# Patient Record
Sex: Female | Born: 1950 | Race: White | Hispanic: No | Marital: Married | Smoking: Never smoker
Health system: Southern US, Community
[De-identification: ages and names within clinical notes are randomized; demographics above are authoritative.]

## PROBLEM LIST (undated history)

## (undated) ENCOUNTER — Emergency Department (HOSPITAL_COMMUNITY): Payer: Managed Care, Other (non HMO) | Source: Home / Self Care

## (undated) DIAGNOSIS — M1990 Unspecified osteoarthritis, unspecified site: Secondary | ICD-10-CM

## (undated) DIAGNOSIS — I10 Essential (primary) hypertension: Secondary | ICD-10-CM

## (undated) DIAGNOSIS — T7840XA Allergy, unspecified, initial encounter: Secondary | ICD-10-CM

## (undated) DIAGNOSIS — M419 Scoliosis, unspecified: Secondary | ICD-10-CM

## (undated) HISTORY — PX: OTHER SURGICAL HISTORY: SHX169

## (undated) HISTORY — DX: Allergy, unspecified, initial encounter: T78.40XA

## (undated) HISTORY — PX: TONSILLECTOMY: SUR1361

---

## 2001-07-10 ENCOUNTER — Other Ambulatory Visit: Admission: RE | Admit: 2001-07-10 | Discharge: 2001-07-10 | Payer: Self-pay | Admitting: Family Medicine

## 2004-09-08 ENCOUNTER — Ambulatory Visit: Payer: Self-pay | Admitting: Family Medicine

## 2005-12-14 ENCOUNTER — Ambulatory Visit: Payer: Self-pay | Admitting: Family Medicine

## 2007-01-05 ENCOUNTER — Ambulatory Visit: Payer: Self-pay

## 2007-01-20 ENCOUNTER — Ambulatory Visit: Payer: Self-pay | Admitting: General Surgery

## 2007-01-20 LAB — HM COLONOSCOPY

## 2008-04-02 ENCOUNTER — Ambulatory Visit: Payer: Self-pay | Admitting: Family Medicine

## 2008-04-16 ENCOUNTER — Ambulatory Visit: Payer: Self-pay | Admitting: Family Medicine

## 2009-05-01 ENCOUNTER — Ambulatory Visit: Payer: Self-pay | Admitting: Family Medicine

## 2010-06-30 ENCOUNTER — Ambulatory Visit: Payer: Self-pay

## 2011-04-16 ENCOUNTER — Emergency Department: Payer: Self-pay | Admitting: Emergency Medicine

## 2011-07-21 ENCOUNTER — Ambulatory Visit: Payer: Self-pay | Admitting: Family Medicine

## 2011-08-24 HISTORY — PX: OTHER SURGICAL HISTORY: SHX169

## 2012-08-10 ENCOUNTER — Ambulatory Visit: Payer: Self-pay | Admitting: Family Medicine

## 2012-09-21 ENCOUNTER — Ambulatory Visit: Payer: Self-pay | Admitting: Family Medicine

## 2013-07-06 LAB — HM PAP SMEAR: HM PAP: NEGATIVE

## 2013-08-13 ENCOUNTER — Ambulatory Visit: Payer: Self-pay | Admitting: Family Medicine

## 2013-08-13 LAB — HM MAMMOGRAPHY

## 2014-08-30 LAB — LIPID PANEL
Cholesterol: 180 mg/dL (ref 0–200)
HDL: 76 mg/dL — AB (ref 35–70)
LDL CALC: 87 mg/dL
TRIGLYCERIDES: 86 mg/dL (ref 40–160)

## 2014-08-30 LAB — BASIC METABOLIC PANEL
BUN: 16 mg/dL (ref 4–21)
Creatinine: 0.9 mg/dL (ref 0.5–1.1)
Glucose: 104 mg/dL
POTASSIUM: 3.9 mmol/L (ref 3.4–5.3)
SODIUM: 144 mmol/L (ref 137–147)

## 2014-08-30 LAB — TSH: TSH: 2.43 u[IU]/mL (ref 0.41–5.90)

## 2014-08-30 LAB — CBC AND DIFFERENTIAL
HEMATOCRIT: 45 % (ref 36–46)
HEMOGLOBIN: 15 g/dL (ref 12.0–16.0)
Platelets: 219 10*3/uL (ref 150–399)
WBC: 6.7 10^3/mL

## 2014-08-30 LAB — HEPATIC FUNCTION PANEL
ALT: 20 U/L (ref 7–35)
AST: 21 U/L (ref 13–35)

## 2015-01-02 DIAGNOSIS — S83209A Unspecified tear of unspecified meniscus, current injury, unspecified knee, initial encounter: Secondary | ICD-10-CM | POA: Insufficient documentation

## 2015-03-12 ENCOUNTER — Telehealth: Payer: Self-pay | Admitting: Family Medicine

## 2015-03-12 ENCOUNTER — Other Ambulatory Visit: Payer: Self-pay | Admitting: Family Medicine

## 2015-03-12 DIAGNOSIS — E669 Obesity, unspecified: Secondary | ICD-10-CM | POA: Insufficient documentation

## 2015-03-12 NOTE — Telephone Encounter (Signed)
Last OV 08/2014   

## 2015-04-28 ENCOUNTER — Other Ambulatory Visit: Payer: Self-pay | Admitting: Family Medicine

## 2015-04-28 DIAGNOSIS — Z1231 Encounter for screening mammogram for malignant neoplasm of breast: Secondary | ICD-10-CM

## 2015-04-30 ENCOUNTER — Ambulatory Visit
Admission: RE | Admit: 2015-04-30 | Discharge: 2015-04-30 | Disposition: A | Discharge status: Home / Self Care | Payer: Managed Care, Other (non HMO) | Source: Ambulatory Visit | Attending: Family Medicine | Admitting: Family Medicine

## 2015-04-30 DIAGNOSIS — Z1231 Encounter for screening mammogram for malignant neoplasm of breast: Secondary | ICD-10-CM | POA: Diagnosis not present

## 2015-09-08 DIAGNOSIS — M25569 Pain in unspecified knee: Secondary | ICD-10-CM | POA: Insufficient documentation

## 2015-09-08 DIAGNOSIS — R799 Abnormal finding of blood chemistry, unspecified: Secondary | ICD-10-CM | POA: Insufficient documentation

## 2015-09-08 DIAGNOSIS — I10 Essential (primary) hypertension: Secondary | ICD-10-CM | POA: Insufficient documentation

## 2015-09-08 DIAGNOSIS — E669 Obesity, unspecified: Secondary | ICD-10-CM | POA: Insufficient documentation

## 2015-09-08 DIAGNOSIS — J45909 Unspecified asthma, uncomplicated: Secondary | ICD-10-CM | POA: Insufficient documentation

## 2015-09-08 DIAGNOSIS — J309 Allergic rhinitis, unspecified: Secondary | ICD-10-CM | POA: Insufficient documentation

## 2015-09-08 DIAGNOSIS — Z8619 Personal history of other infectious and parasitic diseases: Secondary | ICD-10-CM | POA: Insufficient documentation

## 2015-09-08 DIAGNOSIS — R195 Other fecal abnormalities: Secondary | ICD-10-CM | POA: Insufficient documentation

## 2015-09-15 ENCOUNTER — Ambulatory Visit (INDEPENDENT_AMBULATORY_CARE_PROVIDER_SITE_OTHER): Payer: Managed Care, Other (non HMO) | Admitting: Family Medicine

## 2015-09-15 ENCOUNTER — Encounter: Payer: Self-pay | Admitting: Family Medicine

## 2015-09-15 VITALS — BP 136/86 | HR 82 | Temp 99.0°F | Resp 16 | Ht 66.5 in | Wt 256.0 lb

## 2015-09-15 DIAGNOSIS — E669 Obesity, unspecified: Secondary | ICD-10-CM

## 2015-09-15 DIAGNOSIS — Z0000 Encounter for general adult medical examination without abnormal findings: Secondary | ICD-10-CM

## 2015-09-15 LAB — POCT URINALYSIS DIPSTICK
BILIRUBIN UA: NEGATIVE
Blood, UA: NEGATIVE
GLUCOSE UA: NEGATIVE
KETONES UA: NEGATIVE
LEUKOCYTES UA: NEGATIVE
Nitrite, UA: NEGATIVE
PROTEIN UA: NEGATIVE
Spec Grav, UA: 1.01
Urobilinogen, UA: 0.2
pH, UA: 6

## 2015-09-15 MED ORDER — LORCASERIN HCL 10 MG PO TABS: 1.0000 | ORAL_TABLET | Freq: Two times a day (BID) | ORAL | Status: DC

## 2015-09-15 NOTE — Progress Notes (Signed)
Patient ID: ZEIDY TAYAG, female   DOB: 1950/10/22, 65 y.o.   MRN: [id number]       Patient: Rita Bailey, Female    DOB: 04-Jan-1951, 65 y.o.   MRN: [id number] Visit Date: 09/15/2015  Today's Provider: Margarita Rana, MD   Chief Complaint  Patient presents with  . Annual Exam   Subjective:    Annual physical exam Rita Bailey is a 65 y.o. female who presents today for health maintenance and complete physical. She feels well. She reports exercising daily 4000 steps daily. She reports she is sleeping well. 08/29/14 CPE 07/06/13 Pap-neg; HPV-neg 04/30/15 Mammo-BI-RADS 1 01/20/07 Colon-polyp, recheck in 10 yrs  Lab Results  Component Value Date   WBC 6.7 08/30/2014   HGB 15.0 08/30/2014   HCT 45 08/30/2014   PLT 219 08/30/2014   CHOL 180 08/30/2014   TRIG 86 08/30/2014   HDL 76* 08/30/2014   LDLCALC 87 08/30/2014   ALT 20 08/30/2014   AST 21 08/30/2014   NA 144 08/30/2014   K 3.9 08/30/2014   CREATININE 0.9 08/30/2014   BUN 16 08/30/2014   TSH 2.43 08/30/2014    -----------------------------------------------------------------   Review of Systems  Constitutional: Negative.   HENT: Negative.   Eyes: Negative.   Respiratory: Negative.   Cardiovascular: Negative.   Gastrointestinal: Negative.   Endocrine: Negative.   Genitourinary: Negative.   Musculoskeletal: Positive for joint swelling (left knee).  Skin: Negative.   Allergic/Immunologic: Negative.   Neurological: Negative.   Hematological: Negative.   Psychiatric/Behavioral: Negative.     Social History      She  reports that she has never smoked. She has never used smokeless tobacco. She reports that she does not drink alcohol or use illicit drugs.       Social History   Social History  . Marital Status: Married    Spouse Name: N/A  . Number of Children: N/A  . Years of Education: N/A   Social History Main Topics  . Smoking status: Never Smoker   . Smokeless tobacco: Never Used  .  Alcohol Use: No  . Drug Use: No  . Sexual Activity: Not Asked   Other Topics Concern  . None   Social History Narrative    Past Medical History  Diagnosis Date  . Allergy      Patient Active Problem List   Diagnosis Date Noted  . Abnormal blood chemistry 09/08/2015  . Allergic rhinitis 09/08/2015  . Blood pressure elevated without history of HTN 09/08/2015  . Fecal occult blood test positive 09/08/2015  . H/O infectious disease 09/08/2015  . Gonalgia 09/08/2015  . Adiposity 09/08/2015  . RAD (reactive airway disease) 09/08/2015  . Obesity 03/12/2015  . Current tear of meniscus 01/02/2015  . Arthritis of knee, degenerative 01/02/2015    Past Surgical History  Procedure Laterality Date  . Sebaceous syst removed  08/24/2011  . Cesarean section    . Tonsillectomy      Family History        Family Status  Relation Status Death Age  . Mother Alive   . Father Alive     irregular pulse  . Son Alive   . Maternal Grandmother Deceased     asthma  . Maternal Grandfather Deceased     lung cancer  . Paternal Grandmother Deceased     natural causes  . Paternal Grandfather Deceased     MI, alcoholic        Her family history  includes Cerebral palsy in her son; Diabetes in her father and mother; Hypertension in her mother.    No Known Allergies  Previous Medications   BELVIQ 10 MG TABS    TAKE 1 TABLET BY MOUTH TWICE DAILY   CALCIUM-VITAMIN D 600-200 MG-UNIT TABLET    Take 1 tablet by mouth daily.   CHOLECALCIFEROL (D 2000) 2000 UNITS TABS    Take 1 tablet by mouth daily.   FLUTICASONE (FLONASE) 50 MCG/ACT NASAL SPRAY    Place 2 sprays into both nostrils as needed.    MULTIPLE VITAMIN (MULTI-VITAMINS) TABS    Take 1 tablet by mouth daily.   TRIAMTERENE-HYDROCHLOROTHIAZIDE (MAXZIDE-25) 37.5-25 MG TABLET    Take 1 tablet by mouth daily.    Patient Care Team: Margarita Rana, MD as PCP - General (Family Medicine)     Objective:   Vitals: BP 136/86 mmHg  Pulse 82   Temp(Src) 99 F (37.2 C) (Oral)  Resp 16  Ht 5' 6.5" (1.689 m)  Wt 256 lb (116.121 kg)  BMI 40.71 kg/m2  SpO2 96%   Physical Exam  Constitutional: She is oriented to person, place, and time. She appears well-developed and well-nourished.  HENT:  Head: Normocephalic and atraumatic.  Right Ear: Tympanic membrane, external ear and ear canal normal.  Left Ear: Tympanic membrane, external ear and ear canal normal.  Nose: Nose normal.  Mouth/Throat: Uvula is midline, oropharynx is clear and moist and mucous membranes are normal.  Eyes: Conjunctivae, EOM and lids are normal. Pupils are equal, round, and reactive to light.  Neck: Trachea normal and normal range of motion. Neck supple. Carotid bruit is not present. No thyroid mass and no thyromegaly present.  Cardiovascular: Normal rate, regular rhythm and normal heart sounds.   Pulmonary/Chest: Effort normal and breath sounds normal.  Abdominal: Soft. Normal appearance and bowel sounds are normal. There is no hepatosplenomegaly. There is no tenderness.  Genitourinary: No breast swelling, tenderness or discharge.  Musculoskeletal: Normal range of motion.  Lymphadenopathy:    She has no cervical adenopathy.    She has no axillary adenopathy.  Neurological: She is alert and oriented to person, place, and time. She has normal strength. No cranial nerve deficit.  Skin: Skin is warm, dry and intact.  Psychiatric: She has a normal mood and affect. Her speech is normal and behavior is normal. Judgment and thought content normal. Cognition and memory are normal.     Depression Screen PHQ 2/9 Scores 09/15/2015  Exception Documentation Patient refusal      Assessment & Plan:     Routine Health Maintenance and Physical Exam  Exercise Activities and Dietary recommendations Goals    None      Immunization History  Administered Date(s) Administered  . Influenza-Unspecified 05/17/2015  . Tdap 04/29/2009      1. Annual physical  exam Stable. Patient advised to continue eating healthy and exercise daily. Patient sent home with an OC-Light kit. - POCT urinalysis dipstick  2. Obesity Refilled medication.  Patient thinks it does help.  Will continue to monitor.   - Lorcaserin HCl (BELVIQ) 10 MG TABS; Take 1 tablet by mouth 2 (two) times daily.  Dispense: 60 tablet; Refill: 5   Patient seen and examined by Dr. Jerrell Belfast, and note scribed by Philbert Riser. Dimas, CMA.   I have reviewed the document for accuracy and completeness and I agree with above. Jerrell Belfast, MD   Margarita Rana, MD    --------------------------------------------------------------------

## 2015-11-21 ENCOUNTER — Other Ambulatory Visit: Payer: Self-pay | Admitting: Family Medicine

## 2015-11-24 ENCOUNTER — Other Ambulatory Visit: Payer: Self-pay | Admitting: Family Medicine

## 2015-11-24 DIAGNOSIS — R030 Elevated blood-pressure reading, without diagnosis of hypertension: Secondary | ICD-10-CM

## 2016-01-09 ENCOUNTER — Telehealth: Payer: Self-pay

## 2016-01-09 ENCOUNTER — Ambulatory Visit (INDEPENDENT_AMBULATORY_CARE_PROVIDER_SITE_OTHER): Payer: Managed Care, Other (non HMO) | Admitting: Family Medicine

## 2016-01-09 ENCOUNTER — Encounter: Payer: Self-pay | Admitting: Family Medicine

## 2016-01-09 VITALS — BP 144/82 | HR 84 | Temp 99.2°F | Resp 16 | Wt 253.0 lb

## 2016-01-09 DIAGNOSIS — R252 Cramp and spasm: Secondary | ICD-10-CM | POA: Diagnosis not present

## 2016-01-09 DIAGNOSIS — N950 Postmenopausal bleeding: Secondary | ICD-10-CM

## 2016-01-09 DIAGNOSIS — Z124 Encounter for screening for malignant neoplasm of cervix: Secondary | ICD-10-CM

## 2016-01-09 DIAGNOSIS — R030 Elevated blood-pressure reading, without diagnosis of hypertension: Secondary | ICD-10-CM | POA: Diagnosis not present

## 2016-01-09 NOTE — Telephone Encounter (Signed)
Patient states she is in peri menopause stage and states she started to notice some spotting of blood when she urinates a little this week and then noticed on her underwear one day. She wanted your opinion on where she needs to go to get this evaluated, I told her probably needs appointment but she wanted to talk to you-aa-aa

## 2016-01-09 NOTE — Telephone Encounter (Signed)
Please put in at 2:30. Thanks.

## 2016-01-09 NOTE — Progress Notes (Signed)
Patient ID: Rita Bailey, female   DOB: [birth year], 65 y.o.   MRN: [id number]         Patient: Rita Bailey Female    DOB: Nov 17, 1950   65 y.o.   MRN: [id number] Visit Date: 01/09/2016  Today's Provider: Margarita Rana, MD   Chief Complaint  Patient presents with  . Vaginal Bleeding   Subjective:    Vaginal Bleeding The patient's primary symptoms include vaginal bleeding. The patient's pertinent negatives include no pelvic pain or vaginal discharge. This is a new problem. The current episode started in the past 7 days. The pain is mild. Associated symptoms include abdominal pain (Mild left sided discomfort.). Pertinent negatives include no constipation, diarrhea, dysuria, flank pain, frequency, headaches, hematuria, nausea, painful intercourse, urgency or vomiting. The vaginal discharge was normal. The vaginal bleeding is spotting. She has not been passing clots. She has not been passing tissue. She is sexually active. She is postmenopausal.   Also, concerned that she did not have labs done at her CPE.   Is having muscle cramps and is concerned that her potassium is low.      No Known Allergies Previous Medications   CALCIUM-VITAMIN D 600-200 MG-UNIT TABLET    Take 1 tablet by mouth daily.   CHOLECALCIFEROL (D 2000) 2000 UNITS TABS    Take 1 tablet by mouth daily.   FLUTICASONE (FLONASE) 50 MCG/ACT NASAL SPRAY    Place 2 sprays into both nostrils as needed.    LORCASERIN HCL (BELVIQ) 10 MG TABS    Take 1 tablet by mouth 2 (two) times daily.   MULTIPLE VITAMIN (MULTI-VITAMINS) TABS    Take 1 tablet by mouth daily.   TRIAMTERENE-HYDROCHLOROTHIAZIDE (MAXZIDE-25) 37.5-25 MG TABLET    TAKE ONE TABLET BY MOUTH ONCE DAILY   VALACYCLOVIR (VALTREX) 1000 MG TABLET    TAKE 2 TABLETS TWICE DAILY AS NEEDED    Review of Systems  Constitutional: Negative.   Gastrointestinal: Positive for abdominal pain (Mild left sided discomfort.). Negative for nausea, vomiting, diarrhea, constipation,  blood in stool, abdominal distention, anal bleeding and rectal pain.  Genitourinary: Positive for vaginal bleeding and menstrual problem. Negative for dysuria, urgency, frequency, hematuria, flank pain, decreased urine volume, vaginal discharge, enuresis, difficulty urinating, vaginal pain, pelvic pain and dyspareunia.  Neurological: Negative for headaches.    Social History  Substance Use Topics  . Smoking status: Never Smoker   . Smokeless tobacco: Never Used  . Alcohol Use: No   Objective:   BP 144/82 mmHg  Pulse 84  Temp(Src) 99.2 F (37.3 C) (Oral)  Resp 16  Wt 253 lb (114.76 kg)  Physical Exam  Constitutional: She is oriented to person, place, and time. She appears well-developed and well-nourished.  Cardiovascular: Normal rate and regular rhythm.   Pulmonary/Chest: Effort normal and breath sounds normal.  Genitourinary: Cervix exhibits no motion tenderness and no friability. There is bleeding in the vagina. Vaginal discharge (bloody) found.  Neurological: She is alert and oriented to person, place, and time.  Skin: Skin is warm and dry.  Psychiatric: She has a normal mood and affect. Her behavior is normal. Judgment and thought content normal.      Assessment & Plan:      1. Post-menopausal bleeding New problem.  Will refer to OB/Gyn to evaluate and treat.   - Ambulatory referral to Obstetrics / Gynecology - Comprehensive metabolic panel - CBC with Differential/Platelet - TSH - Ferritin  2. Cervical cancer screening Pap  Preformed today.  -  Pap IG and HPV (high risk) DNA detection  3. Blood pressure elevated without history of HTN Will check labs.    - Comprehensive metabolic panel - CBC with Differential/Platelet - TSH - Ferritin  4. Muscle cramps New problem. Will check labs.   - Comprehensive metabolic panel - CBC with Differential/Platelet - TSH - Ferritin  Patient was seen and examined by Jerrell Belfast, MD, and note scribed by Ashley Royalty, CMA.   I have reviewed the document for accuracy and completeness and I agree with above. - Jerrell Belfast, MD      Margarita Rana, MD  [facility]

## 2016-01-10 LAB — COMPREHENSIVE METABOLIC PANEL
A/G RATIO: 1.6 (ref 1.2–2.2)
ALBUMIN: 4.2 g/dL (ref 3.6–4.8)
ALK PHOS: 70 IU/L (ref 39–117)
ALT: 21 IU/L (ref 0–32)
AST: 17 IU/L (ref 0–40)
BILIRUBIN TOTAL: 0.3 mg/dL (ref 0.0–1.2)
BUN / CREAT RATIO: 19 (ref 12–28)
BUN: 18 mg/dL (ref 8–27)
CHLORIDE: 100 mmol/L (ref 96–106)
CO2: 25 mmol/L (ref 18–29)
Calcium: 9.7 mg/dL (ref 8.7–10.3)
Creatinine, Ser: 0.93 mg/dL (ref 0.57–1.00)
GFR calc Af Amer: 76 mL/min/{1.73_m2} (ref >59)
GFR calc non Af Amer: 66 mL/min/{1.73_m2} (ref >59)
GLOBULIN, TOTAL: 2.7 g/dL (ref 1.5–4.5)
GLUCOSE: 104 mg/dL — AB (ref 65–99)
POTASSIUM: 3.7 mmol/L (ref 3.5–5.2)
SODIUM: 141 mmol/L (ref 134–144)
Total Protein: 6.9 g/dL (ref 6.0–8.5)

## 2016-01-10 LAB — CBC WITH DIFFERENTIAL/PLATELET
Basophils Absolute: 0 10*3/uL (ref 0.0–0.2)
Basos: 0 %
EOS (ABSOLUTE): 0.2 10*3/uL (ref 0.0–0.4)
Eos: 3 %
HEMATOCRIT: 43.8 % (ref 34.0–46.6)
Hemoglobin: 14.8 g/dL (ref 11.1–15.9)
IMMATURE GRANULOCYTES: 0 %
Immature Grans (Abs): 0 10*3/uL (ref 0.0–0.1)
LYMPHS ABS: 2.4 10*3/uL (ref 0.7–3.1)
Lymphs: 32 %
MCH: 29.4 pg (ref 26.6–33.0)
MCHC: 33.8 g/dL (ref 31.5–35.7)
MCV: 87 fL (ref 79–97)
MONOS ABS: 0.7 10*3/uL (ref 0.1–0.9)
Monocytes: 9 %
NEUTROS PCT: 56 %
Neutrophils Absolute: 4.2 10*3/uL (ref 1.4–7.0)
PLATELETS: 210 10*3/uL (ref 150–379)
RBC: 5.04 x10E6/uL (ref 3.77–5.28)
RDW: 12.7 % (ref 12.3–15.4)
WBC: 7.6 10*3/uL (ref 3.4–10.8)

## 2016-01-10 LAB — FERRITIN: FERRITIN: 184 ng/mL — AB (ref 15–150)

## 2016-01-10 LAB — TSH: TSH: 2.65 u[IU]/mL (ref 0.450–4.500)

## 2016-01-13 ENCOUNTER — Telehealth: Payer: Self-pay

## 2016-01-13 NOTE — Telephone Encounter (Signed)
LMTCB Emily Drozdowski, CMA  

## 2016-01-13 NOTE — Telephone Encounter (Signed)
Patient advised as below.  

## 2016-01-13 NOTE — Telephone Encounter (Signed)
Patient advised to call [facility] to schedule GYN appt.

## 2016-01-13 NOTE — Telephone Encounter (Signed)
-----   Message from Margarita Rana, MD sent at 01/10/2016  1:59 PM EDT ----- Labs stable. Please notify patient. Thanks.

## 2016-01-14 LAB — PAP IG AND HPV HIGH-RISK
HPV, HIGH-RISK: NEGATIVE
PAP SMEAR COMMENT: 0

## 2016-01-14 NOTE — Progress Notes (Signed)
Advised pt of lab results. Pt verbally acknowledges understanding. Emily Drozdowski, CMA   

## 2016-02-02 ENCOUNTER — Telehealth: Payer: Self-pay | Admitting: Family Medicine

## 2016-02-02 NOTE — Telephone Encounter (Signed)
Pt was referred to specialist and would like to speak to Dr. Venia Minks (only) about the findings.

## 2016-02-03 NOTE — Telephone Encounter (Signed)
Spoke with patient at length. Had a lot of questions regarding proposed gyn procedure.  Will  Make list of questions for follow up. Thanks.

## 2016-02-04 ENCOUNTER — Telehealth: Payer: Self-pay | Admitting: Family Medicine

## 2016-02-04 NOTE — Telephone Encounter (Signed)
LMTCB 02/04/2016  Thanks,   -Mickel Baas

## 2016-02-04 NOTE — Telephone Encounter (Signed)
Did not find a lot of good information about stopping Belviq.  Given nature of medication, would taper, ie, take one a day for couple of weeks before stopping.   Thanks.

## 2016-02-06 NOTE — Telephone Encounter (Signed)
Pt advised.   Thanks,   -Laura  

## 2016-02-11 NOTE — H&P (Signed)
Rita Bailey is a 65 y.o. female here for a Fractional D+C for a 2x2 cm endometrial mass. Pt with PMB  . Underwent EMBX with benign endometrial tissue + polyp .   Past Medical History:  has a past medical history of Herpes labialis; History of chicken pox; measles; mumps; and Obesity, unspecified.  Past Surgical History:  has a past surgical history that includes Cesarean section and Tonsillectomy. Family History: family history includes Diabetes type II in her father; Hypertension in her mother. Social History:  reports that she has never smoked. She has never used smokeless tobacco. She reports that she drinks about 0.6 oz of alcohol per week  OB/GYN History:  OB History    Gravida Para Term Preterm AB TAB SAB Ectopic Multiple Living   2 2 2       2       Allergies: has No Known Allergies. Medications:  Current Outpatient Prescriptions:  .  calcium carbonate-vitamin D3 (CALTRATE 600+D) 600 mg(1,500mg ) -200 unit tablet, Take by mouth., Disp: , Rfl:  .  cholecalciferol (VITAMIN D3) 2,000 unit tablet, Take by mouth., Disp: , Rfl:  .  fluticasone (FLONASE) 50 mcg/actuation nasal spray, by Nasal route., Disp: , Rfl:  .  lorcaserin 10 mg Tab, Take by mouth., Disp: , Rfl:  .  MULTIVITAMIN (MULTIPLE VITAMIN ESSENTIAL ORAL), Take by mouth., Disp: , Rfl:  .  triamterene-hydrochlorothiazide (MAXZIDE-25) 37.5-25 mg tablet, , Disp: , Rfl: 0 .  valACYclovir (VALTREX) 1000 MG tablet, Take by mouth., Disp: , Rfl:   Review of Systems: General:                                          No fatigue or weight loss Eyes:                                                         No vision changes Ears:                                                          No hearing difficulty Respiratory:                No cough or shortness of breath Pulmonary:                                      No asthma or shortness of breath Cardiovascular:                     No chest pain, palpitations, dyspnea on  exertion Gastrointestinal:                    No abdominal bloating, chronic diarrhea, constipations, masses, pain or hematochezia Genitourinary:                                 No hematuria, dysuria, abnormal vaginal discharge, pelvic pain,  Menometrorrhagia Lymphatic:                                       No swollen lymph nodes Musculoskeletal:                   No muscle weakness Neurologic:                                      No extremity weakness, syncope, seizure disorder Psychiatric:                                      No history of depression, delusions or suicidal/homicidal ideation    Exam:      Vitals:   06/29 /2017  BP: (!) 171/91  Pulse: 67    Body mass index is 38.84 kg/(m^2).  WDWN white/female in NAD   Lungs: CTA  CV : RRR without murmur   Breast: exam done in sitting and lying position : No dimpling or retraction, no dominant mass, no spontaneous discharge, no axillary adenopathy Neck:  no thyromegaly Abdomen: soft , no mass, normal active bowel sounds,  non-tender, no rebound tenderness Pelvic: tanner stage 5 ,  External genitalia: vulva /labia no lesions Urethra: no prolapse Vagina: normal physiologic d/c Cervix: no lesions, no cervical motion tenderness   Uterus: normal size shape and contour, non-tender Adnexa: no mass,  non-tender    Saline infusion sonohysterography: betadine prep to the cervix followed by placement of the HSG catheter into the endometrial canal . Sterile H2O is injected while performing a transvaginal u/s . Findings: 2.0x2.1x2.2 cm endometrial mass. Ovaries not seen  Impression:   The primary encounter diagnosis was PMB (postmenopausal bleeding). A diagnosis of Endometrial mass was also pertinent to this visit.    Plan:   Fx D+C and endometrial mass resection with myosure  The risks of the procedure have been explained to her     Caroline Sauger, MD

## 2016-02-12 ENCOUNTER — Encounter
Admission: RE | Admit: 2016-02-12 | Discharge: 2016-02-12 | Disposition: A | Discharge status: Home / Self Care | Payer: Managed Care, Other (non HMO) | Source: Ambulatory Visit | Attending: Obstetrics and Gynecology | Admitting: Obstetrics and Gynecology

## 2016-02-12 DIAGNOSIS — Z01810 Encounter for preprocedural cardiovascular examination: Secondary | ICD-10-CM | POA: Insufficient documentation

## 2016-02-12 DIAGNOSIS — Z01812 Encounter for preprocedural laboratory examination: Secondary | ICD-10-CM | POA: Diagnosis present

## 2016-02-12 HISTORY — DX: Unspecified osteoarthritis, unspecified site: M19.90

## 2016-02-12 HISTORY — DX: Essential (primary) hypertension: I10

## 2016-02-12 LAB — CBC
HCT: 41.2 % (ref 35.0–47.0)
HEMOGLOBIN: 13.9 g/dL (ref 12.0–16.0)
MCH: 29.5 pg (ref 26.0–34.0)
MCHC: 33.8 g/dL (ref 32.0–36.0)
MCV: 87.4 fL (ref 80.0–100.0)
PLATELETS: 161 10*3/uL (ref 150–440)
RBC: 4.72 MIL/uL (ref 3.80–5.20)
RDW: 13.3 % (ref 11.5–14.5)
WBC: 11.3 10*3/uL — AB (ref 3.6–11.0)

## 2016-02-12 LAB — BASIC METABOLIC PANEL
ANION GAP: 7 (ref 5–15)
BUN: 15 mg/dL (ref 6–20)
CO2: 30 mmol/L (ref 22–32)
CREATININE: 0.84 mg/dL (ref 0.44–1.00)
Calcium: 9.3 mg/dL (ref 8.9–10.3)
Chloride: 102 mmol/L (ref 101–111)
Glucose, Bld: 103 mg/dL — ABNORMAL HIGH (ref 65–99)
Potassium: 3.4 mmol/L — ABNORMAL LOW (ref 3.5–5.1)
SODIUM: 139 mmol/L (ref 135–145)

## 2016-02-12 LAB — TYPE AND SCREEN
ABO/RH(D): A POS
ANTIBODY SCREEN: NEGATIVE

## 2016-02-12 NOTE — Pre-Procedure Instructions (Addendum)
MEDICAL REQUEST FOR CLEARANCE/EKG CALLED AND FAXED TO DR Venia Minks AND TO DR SCHERMERHORN'S OFFICE. SPOKE [place]

## 2016-02-12 NOTE — Patient Instructions (Signed)
  Your procedure is scheduled TD:[id number] 14, 2017 (Friday) Report to Day Surgery. [facility] To find out your arrival time please call [phone] between 1PM - 3PM on February 26, 2016 (Thursday).  Remember: Instructions that are not followed completely may result in serious medical risk, up to and including death, or upon the discretion of your surgeon and anesthesiologist your surgery may need to be rescheduled.    _x___ 1. Do not eat food or drink liquids after midnight. No gum chewing or hard candies.     _x___ 2. No Alcohol for 24 hours before or after surgery.   _x___ 3. Do Not Smoke For 24 Hours Prior to Your Surgery.   ____ 4. Bring all medications with you on the day of surgery if instructed.    _x___ 5. Notify your doctor if there is any change in your medical condition     (cold, fever, infections).       Do not wear jewelry, make-up, hairpins, clips or nail polish.  Do not wear lotions, powders, or perfumes. You may wear deodorant.  Do not shave 48 hours prior to surgery. Men may shave face and neck.  Do not bring valuables to the hospital.    [facility] is not responsible for any belongings or valuables.               Contacts, dentures or bridgework may not be worn into surgery.  Leave your suitcase in the car. After surgery it may be brought to your room.  For patients admitted to the hospital, discharge time is determined by your                treatment team.   Patients discharged the day of surgery will not be allowed to drive home.   Please read over the following fact sheets that you were given:   Surgical Site Infection Prevention   ____ Take these medicines the morning of surgery with A SIP OF WATER:    1.   2.   3.   4.  5.  6.  ____ Fleet Enema (as directed)   ____ Use CHG Soap as directed  ____ Use inhalers on the day of surgery  ____ Stop metformin 2 days prior to surgery    ____ Take 1/2 of usual insulin dose the night before  surgery and none on the morning of surgery.   __x__ Stop Coumadin/Plavix/aspirin on (NO ASPIRIN)  _x___ Stop Anti-inflammatories on (NO NSAIDS) Tylenol ok to take for pain if needed   __x__ Stop supplements until after surgery.  (Stop Belviq at least ten days prior to surgery)  ____ Bring C-Pap to the hospital.

## 2016-02-19 NOTE — Pre-Procedure Instructions (Signed)
SPOKE Rita Bailey. FAMILY PRACTICE AND PATIENT SEEING DR Caryn Section 02/24/16 FOR CLEARANCE

## 2016-02-24 ENCOUNTER — Encounter: Payer: Self-pay | Admitting: Family Medicine

## 2016-02-24 ENCOUNTER — Ambulatory Visit (INDEPENDENT_AMBULATORY_CARE_PROVIDER_SITE_OTHER): Payer: Managed Care, Other (non HMO) | Admitting: Family Medicine

## 2016-02-24 VITALS — BP 136/82 | HR 64 | Temp 99.0°F | Resp 16 | Wt 246.0 lb

## 2016-02-24 DIAGNOSIS — Z01818 Encounter for other preprocedural examination: Secondary | ICD-10-CM | POA: Diagnosis not present

## 2016-02-24 DIAGNOSIS — I10 Essential (primary) hypertension: Secondary | ICD-10-CM

## 2016-02-24 NOTE — Progress Notes (Signed)
Patient: Rita Bailey Female    DOB: 1951/02/26   65 y.o.   MRN: [id number] Visit Date: 02/24/2016  Today's Provider: Lelon Huh, MD   Chief Complaint  Patient presents with  . Medical Clearance    Hysteroscopy, D&C 02/27/2016   Subjective:    HPI Pt comes in today for a surgical clearance. She is scheduled for a Hysteroscopy Dialation and Curettage on February 27, 2016 with Dr. Rossie Muskrat.  She had pre-op tests done on 02/12/2016 and found to have slightly low potassium of 3.4, and EKG interpretation could not rule out anterior MI. Comparison of  EKG done on 02/12/2016 with routine EKG 08/29/2014 shows that they are nearly identical. She states that she had a terrible cold when recent labs and EKG were done, and review of past labs shows completely normal electrolytes when she was here for routine follow up of BP in May. She has had no chest pain, palpitations, or shortness of breath. Her URI symptoms have almost completely resolved. She has no family history of early heart disease. Her last cholesterol in 2016 was 180. Her only past surgeries were tonsillectomy as child and C-section. She had no complications from those surgeries, and had some trouble nausea from anesthesia. Her only cardiac risk factor is high blood pressure which is well controlled.     No Known Allergies Current Meds  Medication Sig  . Cholecalciferol (D 2000) 2000 units TABS Take 1 tablet by mouth daily.  . fluticasone (FLONASE) 50 MCG/ACT nasal spray Place 2 sprays into both nostrils as needed.   . Multiple Vitamin (MULTI-VITAMINS) TABS Take 1 tablet by mouth daily.  Marland Kitchen triamterene-hydrochlorothiazide (MAXZIDE-25) 37.5-25 MG tablet TAKE ONE TABLET BY MOUTH ONCE DAILY  . valACYclovir (VALTREX) 1000 MG tablet TAKE 2 TABLETS TWICE DAILY AS NEEDED    Review of Systems  Constitutional: Negative for fever, chills, diaphoresis, activity change, appetite change, fatigue and unexpected weight change.  HENT:  Positive for congestion. Negative for ear discharge, ear pain, nosebleeds, postnasal drip, rhinorrhea, sneezing, sore throat, tinnitus, trouble swallowing and voice change.        Recent sinus infection; just finished a round of antibiotics.  She reports feeling better, but still with congestion  Respiratory: Positive for cough. Negative for apnea, choking, chest tightness, shortness of breath and wheezing.        Pt reports this has improved greatly   Cardiovascular: Negative.   Gastrointestinal: Negative.     Social History  Substance Use Topics  . Smoking status: Never Smoker   . Smokeless tobacco: Never Used  . Alcohol Use: No   Objective:   BP 136/82 mmHg  Pulse 64  Temp(Src) 99 F (37.2 C) (Oral)  Resp 16  Wt 246 lb (111.585 kg)  Physical Exam  General Appearance:    Alert, cooperative, no distress  HENT:   bilateral TM normal without fluid or infection, neck without nodes, throat normal without erythema or exudate, sinuses nontender and nasal mucosa congested  Eyes:    PERRL, conjunctiva/corneas clear, EOM's intact       Lungs:     Clear to auscultation bilaterally, respirations unlabored  Heart:    Regular rate and rhythm, no murmurs rubs or gallops.   Neurologic:   Awake, alert, oriented x 3. No apparent focal neurological           defect.       EKG today shows NSR with some movement artifact. No  sign of ischemic changes.    Assessment & Plan:     1. Pre-operative clearance Low risk procedure in patient at low risk for cardio-pulmonary disease. EKG EKG interpretation based on poor r-wave progression likely due to lead placement. She is low risk for complications of planned procedure and anesthesia and there are no additional pre-cautions recommended.  - EKG 12-Lead  2. Essential hypertension Well controlled on triamterene-hctz.        Lelon Huh, MD  [facility]

## 2016-02-24 NOTE — Pre-Procedure Instructions (Signed)
Saw Dr Caryn Section today for a clearance.  No clearance yet received by pre-admit testing or Dr Tonette Bihari office.  Dr Schermerhorn's office to call Dr Maralyn Sago office regarding the clearance.

## 2016-02-25 NOTE — Pre-Procedure Instructions (Signed)
CLEARED BY DR D FISHER LOW RISK 02/24/16

## 2016-02-27 ENCOUNTER — Ambulatory Visit: Payer: Managed Care, Other (non HMO) | Admitting: Anesthesiology

## 2016-02-27 ENCOUNTER — Encounter
Admission: RE | Disposition: A | Discharge status: Home / Self Care | Payer: Self-pay | Source: Ambulatory Visit | Attending: Obstetrics and Gynecology

## 2016-02-27 ENCOUNTER — Ambulatory Visit
Admission: RE | Admit: 2016-02-27 | Discharge: 2016-02-27 | Disposition: A | Discharge status: Home / Self Care | Payer: Managed Care, Other (non HMO) | Source: Ambulatory Visit | Attending: Obstetrics and Gynecology | Admitting: Obstetrics and Gynecology

## 2016-02-27 DIAGNOSIS — N840 Polyp of corpus uteri: Secondary | ICD-10-CM | POA: Insufficient documentation

## 2016-02-27 DIAGNOSIS — Z6837 Body mass index (BMI) 37.0-37.9, adult: Secondary | ICD-10-CM | POA: Diagnosis not present

## 2016-02-27 DIAGNOSIS — Z833 Family history of diabetes mellitus: Secondary | ICD-10-CM | POA: Diagnosis not present

## 2016-02-27 DIAGNOSIS — Z7951 Long term (current) use of inhaled steroids: Secondary | ICD-10-CM | POA: Insufficient documentation

## 2016-02-27 DIAGNOSIS — Z9889 Other specified postprocedural states: Secondary | ICD-10-CM | POA: Insufficient documentation

## 2016-02-27 DIAGNOSIS — N950 Postmenopausal bleeding: Secondary | ICD-10-CM | POA: Insufficient documentation

## 2016-02-27 DIAGNOSIS — Z79899 Other long term (current) drug therapy: Secondary | ICD-10-CM | POA: Diagnosis not present

## 2016-02-27 DIAGNOSIS — E669 Obesity, unspecified: Secondary | ICD-10-CM | POA: Diagnosis not present

## 2016-02-27 DIAGNOSIS — Z8249 Family history of ischemic heart disease and other diseases of the circulatory system: Secondary | ICD-10-CM | POA: Diagnosis not present

## 2016-02-27 HISTORY — PX: DILATATION & CURETTAGE/HYSTEROSCOPY WITH MYOSURE: SHX6511

## 2016-02-27 LAB — POCT I-STAT 4, (NA,K, GLUC, HGB,HCT)
GLUCOSE: 110 mg/dL — AB (ref 65–99)
HEMATOCRIT: 38 % (ref 36.0–46.0)
Hemoglobin: 12.9 g/dL (ref 12.0–15.0)
POTASSIUM: 3 mmol/L — AB (ref 3.5–5.1)
Sodium: 143 mmol/L (ref 135–145)

## 2016-02-27 LAB — TYPE AND SCREEN
ABO/RH(D): A POS
ANTIBODY SCREEN: NEGATIVE

## 2016-02-27 SURGERY — DILATATION & CURETTAGE/HYSTEROSCOPY WITH MYOSURE
Anesthesia: General | Wound class: Clean Contaminated

## 2016-02-27 MED ORDER — FAMOTIDINE 20 MG PO TABS
20.0000 mg | ORAL_TABLET | Freq: Once | ORAL | Status: AC
  Administered 2016-02-27: 20 mg via ORAL

## 2016-02-27 MED ORDER — DEXAMETHASONE SODIUM PHOSPHATE 10 MG/ML IJ SOLN
INTRAMUSCULAR | Status: DC | PRN
  Administered 2016-02-27: 10 mg via INTRAVENOUS

## 2016-02-27 MED ORDER — ONDANSETRON HCL 4 MG/2ML IJ SOLN: 4.0000 mg | Freq: Once | INTRAMUSCULAR | Status: DC | PRN

## 2016-02-27 MED ORDER — CEFOXITIN SODIUM-DEXTROSE 2-2.2 GM-% IV SOLR (PREMIX)
INTRAVENOUS | Status: AC
  Filled 2016-02-27: qty 50

## 2016-02-27 MED ORDER — FAMOTIDINE 20 MG PO TABS
ORAL_TABLET | ORAL | Status: AC
  Filled 2016-02-27: qty 1

## 2016-02-27 MED ORDER — FENTANYL CITRATE (PF) 100 MCG/2ML IJ SOLN
25.0000 ug | INTRAMUSCULAR | Status: DC | PRN
  Administered 2016-02-27: 25 ug via INTRAVENOUS

## 2016-02-27 MED ORDER — CEFOXITIN SODIUM-DEXTROSE 2-2.2 GM-% IV SOLR (PREMIX)
2.0000 g | INTRAVENOUS | Status: AC
  Administered 2016-02-27: 2000 mg via INTRAVENOUS

## 2016-02-27 MED ORDER — PROPOFOL 10 MG/ML IV BOLUS
INTRAVENOUS | Status: DC | PRN
  Administered 2016-02-27: 150 mg via INTRAVENOUS
  Administered 2016-02-27: 50 mg via INTRAVENOUS

## 2016-02-27 MED ORDER — LIDOCAINE HCL (CARDIAC) 20 MG/ML IV SOLN
INTRAVENOUS | Status: DC | PRN
  Administered 2016-02-27: 100 mg via INTRAVENOUS

## 2016-02-27 MED ORDER — MORPHINE SULFATE (PF) 2 MG/ML IV SOLN: 1.0000 mg | INTRAVENOUS | Status: DC | PRN

## 2016-02-27 MED ORDER — HYDROCODONE-ACETAMINOPHEN 7.5-325 MG PO TABS: 1.0000 | ORAL_TABLET | Freq: Once | ORAL | Status: DC | PRN

## 2016-02-27 MED ORDER — GLYCOPYRROLATE 0.2 MG/ML IJ SOLN
INTRAMUSCULAR | Status: DC | PRN
  Administered 2016-02-27: .2 mg via INTRAVENOUS

## 2016-02-27 MED ORDER — FENTANYL CITRATE (PF) 100 MCG/2ML IJ SOLN
INTRAMUSCULAR | Status: AC
  Filled 2016-02-27: qty 2

## 2016-02-27 MED ORDER — ONDANSETRON HCL 4 MG/2ML IJ SOLN
INTRAMUSCULAR | Status: DC | PRN
  Administered 2016-02-27: 4 mg via INTRAVENOUS

## 2016-02-27 MED ORDER — MIDAZOLAM HCL 2 MG/2ML IJ SOLN
INTRAMUSCULAR | Status: DC | PRN
  Administered 2016-02-27: 2 mg via INTRAVENOUS

## 2016-02-27 MED ORDER — SOD CITRATE-CITRIC ACID 500-334 MG/5ML PO SOLN
30.0000 mL | ORAL | Status: DC
  Filled 2016-02-27: qty 30

## 2016-02-27 MED ORDER — FENTANYL CITRATE (PF) 100 MCG/2ML IJ SOLN
INTRAMUSCULAR | Status: DC | PRN
  Administered 2016-02-27: 50 ug via INTRAVENOUS
  Administered 2016-02-27: 25 ug via INTRAVENOUS

## 2016-02-27 MED ORDER — LACTATED RINGERS IV SOLN
INTRAVENOUS | Status: DC
  Administered 2016-02-27: 06:00:00 via INTRAVENOUS

## 2016-02-27 MED ORDER — PHENYLEPHRINE HCL 10 MG/ML IJ SOLN
INTRAMUSCULAR | Status: DC | PRN
  Administered 2016-02-27 (×2): 100 ug via INTRAVENOUS

## 2016-02-27 SURGICAL SUPPLY — 20 items
ABLATOR ENDOMETRIAL MYOSURE (ABLATOR) ×1 IMPLANT
CANISTER SUC SOCK COL 7IN (MISCELLANEOUS) ×2 IMPLANT
CANISTER SUCT 3000ML (MISCELLANEOUS) ×2 IMPLANT
CATH ROBINSON RED A/P 16FR (CATHETERS) ×2 IMPLANT
DEVICE MYOSURE LITE (MISCELLANEOUS) IMPLANT
GLOVE BIO SURGEON STRL SZ8 (GLOVE) ×2 IMPLANT
GOWN STRL REUS W/ TWL LRG LVL3 (GOWN DISPOSABLE) ×1 IMPLANT
GOWN STRL REUS W/ TWL XL LVL3 (GOWN DISPOSABLE) ×1 IMPLANT
GOWN STRL REUS W/TWL LRG LVL3 (GOWN DISPOSABLE) ×2
GOWN STRL REUS W/TWL XL LVL3 (GOWN DISPOSABLE) ×2
KIT RM TURNOVER CYSTO AR (KITS) ×2 IMPLANT
PACK DNC HYST (MISCELLANEOUS) ×2 IMPLANT
PAD OB MATERNITY 4.3X12.25 (PERSONAL CARE ITEMS) ×2 IMPLANT
PAD PREP 24X41 OB/GYN DISP (PERSONAL CARE ITEMS) ×2 IMPLANT
SOL .9 NS 3000ML IRR  AL (IV SOLUTION) ×1
SOL .9 NS 3000ML IRR AL (IV SOLUTION) ×1
SOL .9 NS 3000ML IRR UROMATIC (IV SOLUTION) ×1 IMPLANT
TOWEL OR 17X26 4PK STRL BLUE (TOWEL DISPOSABLE) ×2 IMPLANT
TUBING CONNECTING 10 (TUBING) ×2 IMPLANT
TUBING HYSTEROSCOPY DOLPHIN (MISCELLANEOUS) ×2 IMPLANT

## 2016-02-27 NOTE — Anesthesia Preprocedure Evaluation (Addendum)
Anesthesia Evaluation  Patient identified by MRN, date of birth, ID band Patient awake    Reviewed: Allergy & Precautions, NPO status , Patient's Chart, lab work & pertinent test results, reviewed documented beta blocker date and time   Airway Mallampati: II  TM Distance: >3 FB     Dental  (+) Chipped   Pulmonary           Cardiovascular hypertension,      Neuro/Psych    GI/Hepatic   Endo/Other    Renal/GU      Musculoskeletal   Abdominal   Peds  Hematology   Anesthesia Other Findings EKG OK Poor R waves otherwise OK. Obese.  Reproductive/Obstetrics                            Anesthesia Physical Anesthesia Plan  ASA: III  Anesthesia Plan: General   Post-op Pain Management:    Induction: Intravenous  Airway Management Planned: Oral ETT and LMA  Additional Equipment:   Intra-op Plan:   Post-operative Plan:   Informed Consent: I have reviewed the patients History and Physical, chart, labs and discussed the procedure including the risks, benefits and alternatives for the proposed anesthesia with the patient or authorized representative who has indicated his/her understanding and acceptance.     Plan Discussed with: CRNA  Anesthesia Plan Comments:         Anesthesia Quick Evaluation

## 2016-02-27 NOTE — Anesthesia Procedure Notes (Signed)
Procedure Name: LMA Insertion Date/Time: 02/27/2016 7:45 AM Performed by: Silvana Newness Pre-anesthesia Checklist: Patient identified, Emergency Drugs available, Suction available, Patient being monitored and Timeout performed Patient Re-evaluated:Patient Re-evaluated prior to inductionOxygen Delivery Method: Circle system utilized Preoxygenation: Pre-oxygenation with 100% oxygen Intubation Type: IV induction Ventilation: Mask ventilation without difficulty LMA: LMA inserted LMA Size: 5.0 Number of attempts: 1 Placement Confirmation: positive ETCO2 and breath sounds checked- equal and bilateral Tube secured with: Tape Dental Injury: Teeth and Oropharynx as per pre-operative assessment

## 2016-02-27 NOTE — Anesthesia Postprocedure Evaluation (Signed)
Anesthesia Post Note  Patient: Rita Bailey  Procedure(s) Performed: Procedure(s) (LRB): DILATATION & CURETTAGE/HYSTEROSCOPY WITH MYOSURE (N/A)  Patient location during evaluation: PACU Anesthesia Type: General Level of consciousness: awake and alert Pain management: pain level controlled Vital Signs Assessment: post-procedure vital signs reviewed and stable Respiratory status: spontaneous breathing, nonlabored ventilation, respiratory function stable and patient connected to nasal cannula oxygen Cardiovascular status: blood pressure returned to baseline and stable Postop Assessment: no signs of nausea or vomiting Anesthetic complications: no    Last Vitals:  Filed Vitals:   02/27/16 0953 02/27/16 1016  BP: 151/64 149/73  Pulse: 64 62  Temp: 36.4 C   Resp: 16 16    Last Pain:  Filed Vitals:   02/27/16 1017  PainSc: 0-No pain                 Lily Velasquez S

## 2016-02-27 NOTE — OR Nursing (Signed)
Patient instructed how to perform incentive spirometry with return demonstration

## 2016-02-27 NOTE — Discharge Instructions (Signed)
Dilation and Curettage or Vacuum Curettage, Care After Refer to this sheet in the next few weeks. These instructions provide you with information on caring for yourself after your procedure. Your health care provider may also give you more specific instructions. Your treatment has been planned according to current medical practices, but problems sometimes occur. Call your health care provider if you have any problems or questions after your procedure. WHAT TO EXPECT AFTER THE PROCEDURE After your procedure, it is typical to have light cramping and bleeding. This may last for 2 days to 2 weeks after the procedure. HOME CARE INSTRUCTIONS   Do not drive for 24 hours.  Wait 1 week before returning to strenuous activities.  Take your temperature 2 times a day for 4 days and write it down. Provide these temperatures to your health care provider if you develop a fever.  Avoid long periods of standing.  Avoid heavy lifting, pushing, or pulling. Do not lift anything heavier than 10 pounds (4.5 kg).  Limit stair climbing to once or twice a day.  Take rest periods often.  You may resume your usual diet.  Drink enough fluids to keep your urine clear or pale yellow.  Your usual bowel function should return. If you have constipation, you may:  Take a mild laxative with permission from your health care provider.  Add fruit and bran to your diet.  Drink more fluids.  Take showers instead of baths until your health care provider gives you permission to take baths.  Do not go swimming or use a hot tub until your health care provider approves.  Try to have someone with you or available to you the first 24-48 hours, especially if you were given a general anesthetic.  Do not douche, use tampons, or have sex (intercourse) for 2 weeks after the procedure.  Only take over-the-counter or prescription medicines as directed by your health care provider. Do not take aspirin. It can cause  bleeding.  Follow up with your health care provider as directed. SEEK MEDICAL CARE IF:   You have increasing cramps or pain that is not relieved with medicine.  You have abdominal pain that does not seem to be related to the same area of earlier cramping and pain.  You have bad smelling vaginal discharge.  You have a rash.  You are having problems with any medicine. SEEK IMMEDIATE MEDICAL CARE IF:   You have bleeding that is heavier than a normal menstrual period.  You have a fever.  You have chest pain.  You have shortness of breath.  You feel dizzy or feel like fainting.  You pass out.  You have pain in your shoulder strap area.  You have heavy vaginal bleeding with or without blood clots. MAKE SURE YOU:   Understand these instructions.  Will watch your condition.  Will get help right away if you are not doing well or get worse.   This information is not intended to replace advice given to you by your health care provider. Make sure you discuss any questions you have with your health care provider.   Document Released: 07/30/2000 Document Revised: 08/07/2013 Document Reviewed: 03/01/2013 Elsevier Interactive Patient Education 2016 [place] Anesthesia, Adult, Care After Refer to this sheet in the next few weeks. These instructions provide you with information on caring for yourself after your procedure. Your health care provider may also give you more specific instructions. Your treatment has been planned according to current medical practices, but problems  sometimes occur. Call your health care provider if you have any problems or questions after your procedure. WHAT TO EXPECT AFTER THE PROCEDURE After the procedure, it is typical to experience:  Sleepiness.  Nausea and vomiting. HOME CARE INSTRUCTIONS  For the first 24 hours after general anesthesia:  Have a responsible person with you.  Do not drive a car. If you are alone, do not take  public transportation.  Do not drink alcohol.  Do not take medicine that has not been prescribed by your health care provider.  Do not sign important papers or make important decisions.  You may resume a normal diet and activities as directed by your health care provider.  Change bandages (dressings) as directed.  If you have questions or problems that seem related to general anesthesia, call the hospital and ask for the anesthetist or anesthesiologist on call. SEEK MEDICAL CARE IF:  You have nausea and vomiting that continue the day after anesthesia.  You develop a rash. SEEK IMMEDIATE MEDICAL CARE IF:   You have difficulty breathing.  You have chest pain.  You have any allergic problems.   This information is not intended to replace advice given to you by your health care provider. Make sure you discuss any questions you have with your health care provider.   Document Released: 11/08/2000 Document Revised: 08/23/2014 Document Reviewed: 12/01/2011 Elsevier Interactive Patient Education [employer].

## 2016-02-27 NOTE — OR Nursing (Signed)
Potassium of 3.0 reported to Dr Marcello Moores no new orders at this time

## 2016-02-27 NOTE — Op Note (Signed)
NAMETAKA, GAFFKE NO.:  [id number]  MEDICAL RECORD NO.:  [id number]  LOCATION:  [facility]                         FACILITY:  [facility]  PHYSICIAN:  Laverta Baltimore, MDDATE OF BIRTH:  [birth year]  DATE OF PROCEDURE: DATE OF DISCHARGE:                              OPERATIVE REPORT   PREOPERATIVE DIAGNOSIS: 1. Postmenopausal bleeding. 2. Endometrial polyp.  POSTOPERATIVE DIAGNOSIS: 1. Postmenopausal bleeding. 2. Endometrial polyp.  PROCEDURE PERFORMED: 1. Fractional dilation curettage. 2. Resection of endometrial polyp with MyoSure.  SURGEON:  Laverta Baltimore, MD  ANESTHESIA:  General endotracheal anesthesia.  SURGEON:  Laverta Baltimore, MD.  INDICATIONS:  A 65 year old, gravida 2, para 2 patient with history of postmenopausal bleeding.  Patient underwent endometrial biopsy in the office that showed benign pathology except for an endometrial polyp. Saline infusion sonohysterography demonstrated a 2 x 2 cm endometrial mass consistent with polyp.  DESCRIPTION OF PROCEDURE:  After adequate general endotracheal anesthesia, patient was placed in dorsal supine position.  Legs were placed in the candy-cane stirrups.  Perineal and vaginal prep was performed with Betadine.  Patient was sterilely draped.  Time-out was performed.  The patient's bladder was catheterized with a red Robinson catheter yielding 100 mL clear urine.  The patient did receive 2 g IV cefoxitin prior to commencement of the case.  Single-tooth tenaculum was placed on the anterior cervix.  Weighted speculum was placed in the posterior vaginal vault.  Endocervical curettage was performed.  The uterus was sounded to 7.5 cm.  The cervix was then dilated to #18 Hanks dilator without difficulty.  The hysteroscope was advanced into the endometrial cavity and normal saline was used as distending medium. Large polypoid mass was identified entering into the endocervical canal. The MyoSure was  brought up to the operative field.  MyoSure resection of this large 2 cm endometrial polyp was performed.  MyoSure operating time 57 seconds.  Intraop pictures taken.  The rest of the endometrial canal appeared normal after resection of the polyp.  Good hemostasis was noted.  Hysteroscope was removed.  NORMAL SALINE DEFICIT:  370 mL.  INTRAOPERATIVE FLUIDS:  550 mL.  ESTIMATED BLOOD LOSS:  5 mL.  URINE OUTPUT:  100 mL.  Patient tolerated the procedure well, was taken to recovery room in good condition.          ______________________________ Laverta Baltimore, MD     TS/MEDQ  D:  02/27/2016  T:  02/27/2016  Job:  [id number]

## 2016-02-27 NOTE — Brief Op Note (Signed)
02/27/2016  8:25 AM  PATIENT:  Ashok Croon  65 y.o. female  PRE-OPERATIVE DIAGNOSIS:  Endometrial Mass  PMB  POST-OPERATIVE DIAGNOSIS:  Endometrial Mass  PMB  PROCEDURE:  Procedure(s): DILATATION & CURETTAGE/HYSTEROSCOPY WITH MYOSURE (N/A) Fractional D+C , Myosure resection of endometrial polyp SURGEON:  Surgeon(s) and Role:    Boykin Nearing, MD - Primary  PHYSICIAN ASSISTANT:   ASSISTANTS: none   ANESTHESIA:   general  EBL:  Total I/O In: -  Out: 5 [Blood:5]  BLOOD ADMINISTERED:none  DRAINS: none   LOCAL MEDICATIONS USED:  NONE  SPECIMEN:  Source of Specimen:  ecc, endometrial polyp , endometrial currettings  DISPOSITION OF SPECIMEN:  PATHOLOGY  COUNTS:  YES  TOURNIQUET:  * No tourniquets in log *  DICTATION: .Other Dictation: Dictation Number verbal  PLAN OF CARE: Discharge to home after PACU  PATIENT DISPOSITION:  PACU - hemodynamically stable.   Delay start of Pharmacological VTE agent (>24hrs) due to surgical blood loss or risk of bleeding: not applicable

## 2016-02-27 NOTE — Transfer of Care (Signed)
Immediate Anesthesia Transfer of Care Note  Patient: Rita Bailey  Procedure(s) Performed: Procedure(s): DILATATION & CURETTAGE/HYSTEROSCOPY WITH MYOSURE (N/A)  Patient Location: PACU  Anesthesia Type:General  Level of Consciousness: awake, alert , oriented and patient cooperative  Airway & Oxygen Therapy: Patient Spontanous Breathing and Patient connected to face mask oxygen  Post-op Assessment: Report given to RN, Post -op Vital signs reviewed and stable and Patient moving all extremities X 4  Post vital signs: Reviewed and stable  Last Vitals:  Filed Vitals:   02/27/16 0603  BP: 137/74  Pulse: 97  Temp: 36.5 C  Resp: 16    Last Pain: There were no vitals filed for this visit.       Complications: No apparent anesthesia complications

## 2016-02-27 NOTE — Progress Notes (Signed)
Ready for surgery . NPO . All questions answered  

## 2016-03-02 LAB — SURGICAL PATHOLOGY

## 2016-03-22 NOTE — Telephone Encounter (Signed)
error 

## 2016-08-26 ENCOUNTER — Encounter: Payer: Self-pay | Admitting: Physician Assistant

## 2016-08-26 ENCOUNTER — Ambulatory Visit (INDEPENDENT_AMBULATORY_CARE_PROVIDER_SITE_OTHER): Payer: Managed Care, Other (non HMO) | Admitting: Physician Assistant

## 2016-08-26 VITALS — BP 132/74 | HR 72 | Temp 100.0°F | Resp 16 | Wt 256.0 lb

## 2016-08-26 DIAGNOSIS — R1032 Left lower quadrant pain: Secondary | ICD-10-CM

## 2016-08-26 LAB — POCT URINALYSIS DIPSTICK
Bilirubin, UA: NEGATIVE
Blood, UA: NEGATIVE
Glucose, UA: NEGATIVE
Ketones, UA: NEGATIVE
Leukocytes, UA: NEGATIVE
Nitrite, UA: NEGATIVE
Protein, UA: NEGATIVE
Spec Grav, UA: 1.01
Urobilinogen, UA: 0.2
pH, UA: 6.5

## 2016-08-26 MED ORDER — AMOXICILLIN-POT CLAVULANATE 875-125 MG PO TABS: 1.0000 | ORAL_TABLET | Freq: Two times a day (BID) | ORAL | 0 refills | Status: AC

## 2016-08-26 NOTE — Progress Notes (Signed)
Patient: Rita Bailey Female    DOB: May 11, 1951   66 y.o.   MRN: [id number] Visit Date: 08/26/2016  Today's Provider: Trinna Post, PA-C   Chief Complaint  Patient presents with  . Diarrhea    Started 13 days ago.    Subjective:    Diarrhea   This is a new problem. The current episode started 1 to 4 weeks ago. The problem occurs 2 to 4 times per day (Pt reports going every hour in the beginning). The problem has been unchanged. The stool consistency is described as watery. The patient states that diarrhea awakens (Only at first. ) her from sleep. Associated symptoms include abdominal pain, chills, a fever (Temp was 100.6 last night. ), headaches and myalgias. Pertinent negatives include no bloating or vomiting. Nothing aggravates the symptoms.   The patient is a 66 y/o female presenting today with diarrhea ongoing for two weeks. She reports her symptoms started two weeks ago when she was at the beach. She reports she had a fever > 102F and diarrhea 6-7 times a day. She saw an urgent care provider who said she had the flu despite a negative flu swab. She was told if her symptoms didn't resolve to follow up with her PCP.   She presents today maintaining a low grade temperature. She is not nauseas. She vomited once at the start of her symptoms after taking Pepcid, but not since then. She reports some diffuse abdominal pain that is moderate. She is having diarrhea 4 times per day that she describes as nonbloody, nonmucoid, and watery. She does not recall any suspicious food ingestions. No travel outside the country. No sick contacts. Tolerating food okay though appetite is decreased. Drinking okay, urinating okay. No dysuria or flank pain. No vaginal bleeding or discharge. Last colonoscopy in 2008 was normal, had a benign polyp.  No Known Allergies   Current Outpatient Prescriptions:  .  Cholecalciferol (D 2000) 2000 units TABS, Take 1 tablet by mouth daily., Disp: , Rfl:  .   fluticasone (FLONASE) 50 MCG/ACT nasal spray, Place 2 sprays into both nostrils as needed. , Disp: , Rfl:  .  Lorcaserin HCl (BELVIQ) 10 MG TABS, Take 1 tablet by mouth 2 (two) times daily., Disp: 60 tablet, Rfl: 5 .  Multiple Vitamin (MULTI-VITAMINS) TABS, Take 1 tablet by mouth daily., Disp: , Rfl:  .  triamterene-hydrochlorothiazide (MAXZIDE-25) 37.5-25 MG tablet, TAKE ONE TABLET BY MOUTH ONCE DAILY, Disp: 90 tablet, Rfl: 3 .  valACYclovir (VALTREX) 1000 MG tablet, TAKE 2 TABLETS TWICE DAILY AS NEEDED, Disp: 30 tablet, Rfl: 5  Review of Systems  Constitutional: Positive for chills, fatigue and fever (Temp was 100.6 last night. ). Negative for activity change, appetite change, diaphoresis and unexpected weight change.  Gastrointestinal: Positive for abdominal pain and diarrhea. Negative for abdominal distention, anal bleeding, bloating, blood in stool, constipation, nausea, rectal pain and vomiting.  Musculoskeletal: Positive for myalgias.  Neurological: Positive for headaches. Negative for dizziness and light-headedness.    Social History  Substance Use Topics  . Smoking status: Never Smoker  . Smokeless tobacco: Never Used  . Alcohol use No   Objective:   BP 132/74 (BP Location: Right Arm, Patient Position: Sitting, Cuff Size: Large)   Pulse 72   Temp 100 F (37.8 C) (Oral)   Resp 16   Wt 256 lb (116.1 kg)   BMI 40.10 kg/m   Physical Exam  Constitutional: She is oriented to person, place,  and time. She appears well-developed and well-nourished.  HENT:  Mouth/Throat: Mucous membranes are normal. Mucous membranes are not pale and not dry.  Cardiovascular: Normal rate, regular rhythm and normal heart sounds.   Pulmonary/Chest: Effort normal and breath sounds normal.  Abdominal: Soft. Bowel sounds are normal. She exhibits no distension and no mass. There is tenderness in the left lower quadrant. There is no rigidity, no rebound, no guarding, no CVA tenderness, no tenderness at  McBurney's point and negative Murphy's sign.  Neurological: She is alert and oriented to person, place, and time.  Skin: Skin is warm and dry.  Psychiatric: She has a normal mood and affect. Her behavior is normal.        Assessment & Plan:     1. LLQ pain  Patient is presenting today with persistent diarrhea, low grade temp, and some abdominal pain concerning for diverticulitis, gastroenteritis, infectious diarrhea. Today patient is afebrile and nontoxic looking without signs of acute abdomen. Will evaluate with labs as below for infectious causes and to check hydration status/organ function, most specifically diverticulitis. Will cover her with Augmentin for this diagnosis. Deferred stool sample as stool is nonbloody and most causes of infectious diarrhea are self limiting. Will await labwork. Urinalysis was negative. Will have her follow up on Monday.  - CBC w/Diff/Platelet - Comprehensive metabolic panel - Urinalysis - amoxicillin-clavulanate (AUGMENTIN) 875-125 MG tablet; Take 1 tablet by mouth 2 (two) times daily.  Dispense: 20 tablet; Refill: 0  The entirety of the information documented in the History of Present Illness, Review of Systems and Physical Exam were personally obtained by me. Portions of this information were initially documented by Ashley Royalty, CMA and reviewed by me for thoroughness and accuracy.   There are no Patient Instructions on file for this visit.  Return in about 4 days (around 08/30/2016).        Trinna Post, PA-C  [facility]

## 2016-08-26 NOTE — Addendum Note (Signed)
Addended by: Ashley Royalty E on: 08/26/2016 04:31 PM   Modules accepted: Orders

## 2016-08-26 NOTE — Patient Instructions (Signed)
Diarrhea, Adult °Introduction °Diarrhea is when you have loose and water poop (stool) often. Diarrhea can make you feel weak and cause you to get dehydrated. Dehydration can make you tired and thirsty, make you have a dry mouth, and make it so you pee (urinate) less often. Diarrhea often lasts 2-3 days. However, it can last longer if it is a sign of something more serious. It is important to treat your diarrhea as told by your doctor. °Follow these instructions at home: °Eating and drinking °Follow these recommendations as told by your doctor: °· Take an oral rehydration solution (ORS). This is a drink that is sold at pharmacies and stores. °· Drink clear fluids, such as: °¨ Water. °¨ Ice chips. °¨ Diluted fruit juice. °¨ Low-calorie sports drinks. °· Eat bland, easy-to-digest foods in small amounts as you are able. These foods include: °¨ Bananas. °¨ Applesauce. °¨ Rice. °¨ Low-fat (lean) meats. °¨ Toast. °¨ Crackers. °· Avoid drinking fluids that have a lot of sugar or caffeine in them. °· Avoid alcohol. °· Avoid spicy or fatty foods. °General instructions °· Drink enough fluid to keep your pee (urine) clear or pale yellow. °· Wash your hands often. If you cannot use soap and water, use hand sanitizer. °· Make sure that all people in your home wash their hands well and often. °· Take over-the-counter and prescription medicines only as told by your doctor. °· Rest at home while you get better. °· Watch your condition for any changes. °· Take a warm bath to help with any burning or pain from having diarrhea. °· Keep all follow-up visits as told by your doctor. This is important. °Contact a doctor if: °· You have a fever. °· Your diarrhea gets worse. °· You have new symptoms. °· You cannot keep fluids down. °· You feel light-headed or dizzy. °· You have a headache. °· You have muscle cramps. °Get help right away if: °· You have chest pain. °· You feel very weak or you pass out (faint). °· You have bloody or black  poop or poop that look like tar. °· You have very bad pain, cramping, or bloating in your belly (abdomen). °· You have trouble breathing or you are breathing very quickly. °· Your heart is beating very quickly. °· Your skin feels cold and clammy. °· You feel confused. °· You have signs of dehydration, such as: °¨ Dark pee, hardly any pee, or no pee. °¨ Cracked lips. °¨ Dry mouth. °¨ Sunken eyes. °¨ Sleepiness. °¨ Weakness. °This information is not intended to replace advice given to you by your health care provider. Make sure you discuss any questions you have with your health care provider. °Document Released: 01/19/2008 Document Revised: 02/20/2016 Document Reviewed: 04/08/2015 °© 2017 Elsevier ° °

## 2016-08-27 ENCOUNTER — Telehealth: Payer: Self-pay

## 2016-08-27 LAB — COMPREHENSIVE METABOLIC PANEL
ALT: 24 IU/L (ref 0–32)
AST: 25 IU/L (ref 0–40)
Albumin/Globulin Ratio: 1.2 (ref 1.2–2.2)
Albumin: 3.8 g/dL (ref 3.6–4.8)
Alkaline Phosphatase: 85 IU/L (ref 39–117)
BUN/Creatinine Ratio: 15 (ref 12–28)
BUN: 13 mg/dL (ref 8–27)
Bilirubin Total: 0.4 mg/dL (ref 0.0–1.2)
CO2: 26 mmol/L (ref 18–29)
Calcium: 9.1 mg/dL (ref 8.7–10.3)
Chloride: 97 mmol/L (ref 96–106)
Creatinine, Ser: 0.84 mg/dL (ref 0.57–1.00)
GFR calc Af Amer: 86 mL/min/{1.73_m2} (ref >59)
GFR calc non Af Amer: 74 mL/min/{1.73_m2} (ref >59)
Globulin, Total: 3.1 g/dL (ref 1.5–4.5)
Glucose: 103 mg/dL — ABNORMAL HIGH (ref 65–99)
Potassium: 3.6 mmol/L (ref 3.5–5.2)
Sodium: 142 mmol/L (ref 134–144)
Total Protein: 6.9 g/dL (ref 6.0–8.5)

## 2016-08-27 LAB — CBC WITH DIFFERENTIAL/PLATELET
Basophils Absolute: 0.1 10*3/uL (ref 0.0–0.2)
Basos: 2 %
EOS (ABSOLUTE): 0.1 10*3/uL (ref 0.0–0.4)
Eos: 1 %
Hematocrit: 42.8 % (ref 34.0–46.6)
Hemoglobin: 14.5 g/dL (ref 11.1–15.9)
Immature Grans (Abs): 0 10*3/uL (ref 0.0–0.1)
Immature Granulocytes: 0 %
Lymphocytes Absolute: 2.9 10*3/uL (ref 0.7–3.1)
Lymphs: 41 %
MCH: 29.1 pg (ref 26.6–33.0)
MCHC: 33.9 g/dL (ref 31.5–35.7)
MCV: 86 fL (ref 79–97)
Monocytes Absolute: 1 10*3/uL — ABNORMAL HIGH (ref 0.1–0.9)
Monocytes: 14 %
Neutrophils Absolute: 3 10*3/uL (ref 1.4–7.0)
Neutrophils: 42 %
Platelets: 260 10*3/uL (ref 150–379)
RBC: 4.98 x10E6/uL (ref 3.77–5.28)
RDW: 13.7 % (ref 12.3–15.4)
WBC: 7.1 10*3/uL (ref 3.4–10.8)

## 2016-08-27 NOTE — Telephone Encounter (Signed)
Pt advised.   Thanks,   -Nicolet Griffy  

## 2016-08-27 NOTE — Telephone Encounter (Signed)
-----   Message from Trinna Post, [place] sent at 08/27/2016  1:31 PM EST ----- Normal white count, normal metabolic panel. Would like patient to stay on her antibiotics and follow up Monday for re-evaluation.

## 2016-08-30 ENCOUNTER — Ambulatory Visit (INDEPENDENT_AMBULATORY_CARE_PROVIDER_SITE_OTHER): Payer: Managed Care, Other (non HMO) | Admitting: Physician Assistant

## 2016-08-30 ENCOUNTER — Encounter: Payer: Self-pay | Admitting: Physician Assistant

## 2016-08-30 VITALS — BP 124/76 | HR 64 | Temp 99.2°F | Resp 16 | Wt 255.0 lb

## 2016-08-30 DIAGNOSIS — R197 Diarrhea, unspecified: Secondary | ICD-10-CM

## 2016-08-30 DIAGNOSIS — R1032 Left lower quadrant pain: Secondary | ICD-10-CM | POA: Diagnosis not present

## 2016-08-30 NOTE — Patient Instructions (Signed)
Diverticulitis °Diverticulitis is when small pockets that have formed in your colon (large intestine) become infected or swollen. °Follow these instructions at home: °· Follow your doctor's instructions. °· Follow a special diet if told by your doctor. °· When you feel better, your doctor may tell you to change your diet. You may be told to eat a lot of fiber. Fruits and vegetables are good sources of fiber. Fiber makes it easier to poop (have bowel movements). °· Take supplements or probiotics as told by your doctor. °· Only take medicines as told by your doctor. °· Keep all follow-up visits with your doctor. °Contact a doctor if: °· Your pain does not get better. °· You have a hard time eating food. °· You are not pooping like normal. °Get help right away if: °· Your pain gets worse. °· Your problems do not get better. °· Your problems suddenly get worse. °· You have a fever. °· You keep throwing up (vomiting). °· You have bloody or black, tarry poop (stool). °This information is not intended to replace advice given to you by your health care provider. Make sure you discuss any questions you have with your health care provider. °Document Released: 01/19/2008 Document Revised: 01/08/2016 Document Reviewed: 06/27/2013 °Elsevier Interactive Patient Education © 2017 [employer] ° °

## 2016-08-30 NOTE — Progress Notes (Signed)
Patient: Rita Bailey Female    DOB: [birth year]   66 y.o.   MRN: [id number] Visit Date: 08/30/2016  Today's Provider: Trinna Post, PA-C   Chief Complaint  Patient presents with  . Abdominal Pain    Four day follow up   Subjective:    Abdominal Pain  This is a new problem. The current episode started 1 to 4 weeks ago. The problem has been gradually improving. The pain is located in the generalized abdominal region. The pain is at a severity of 1/10 (Worse in the last four day was 8/10). The quality of the pain is cramping. Associated symptoms include diarrhea. Pertinent negatives include no anorexia, constipation, fever, headaches, hematuria, nausea or vomiting.   Patient is 66 y/o woman seen in clinic on 08/26/2016 with diarrhea, low grade temp ongoing for two weeks and LLQ pain who was treated empirically for diverticulitis with Augmentin. CBC and CMET returned normal. Patient presents today feeling better and reporting she thinks she has turned a corner. She reports her diarrhea has decreased from 4 times to 2 times per day, still non bloody and slightly more formed. Her temperature has gone down. She still has some abdominal pain but this has improved. No nausea or vomiting.     No Known Allergies   Current Outpatient Prescriptions:  .  amoxicillin-clavulanate (AUGMENTIN) 875-125 MG tablet, Take 1 tablet by mouth 2 (two) times daily., Disp: 20 tablet, Rfl: 0 .  Cholecalciferol (D 2000) 2000 units TABS, Take 1 tablet by mouth daily., Disp: , Rfl:  .  fluticasone (FLONASE) 50 MCG/ACT nasal spray, Place 2 sprays into both nostrils as needed. , Disp: , Rfl:  .  Multiple Vitamin (MULTI-VITAMINS) TABS, Take 1 tablet by mouth daily., Disp: , Rfl:  .  triamterene-hydrochlorothiazide (MAXZIDE-25) 37.5-25 MG tablet, TAKE ONE TABLET BY MOUTH ONCE DAILY, Disp: 90 tablet, Rfl: 3 .  valACYclovir (VALTREX) 1000 MG tablet, TAKE 2 TABLETS TWICE DAILY AS NEEDED, Disp: 30 tablet, Rfl:  5 .  Lorcaserin HCl (BELVIQ) 10 MG TABS, Take 1 tablet by mouth 2 (two) times daily. (Patient not taking: Reported on 08/30/2016), Disp: 60 tablet, Rfl: 5  Review of Systems  Constitutional: Positive for activity change and fatigue. Negative for appetite change, chills, diaphoresis, fever and unexpected weight change.  Gastrointestinal: Positive for abdominal pain and diarrhea. Negative for abdominal distention, anal bleeding, anorexia, blood in stool, constipation, nausea, rectal pain and vomiting.  Genitourinary: Negative.  Negative for hematuria.  Neurological: Negative for dizziness, light-headedness and headaches.    Social History  Substance Use Topics  . Smoking status: Never Smoker  . Smokeless tobacco: Never Used  . Alcohol use No   Objective:   BP 124/76 (BP Location: Left Arm, Patient Position: Sitting, Cuff Size: Large)   Pulse 64   Temp 99.2 F (37.3 C) (Oral)   Resp 16   Wt 255 lb (115.7 kg)   BMI 39.94 kg/m   Physical Exam  Constitutional: She is oriented to person, place, and time. She appears well-developed and well-nourished.  Non-toxic appearance.  Cardiovascular: Normal rate.   Pulmonary/Chest: Effort normal.  Abdominal: Soft. Bowel sounds are normal. She exhibits no distension and no mass. There is no tenderness. There is no rebound and no guarding.  Neurological: She is alert and oriented to person, place, and time.  Skin: Skin is warm and dry.  Psychiatric: She has a normal mood and affect. Her behavior is normal.  Assessment & Plan:     1. LLQ pain  Patient completing Augmentin course, feels markedly better. Her diarrhea has decreased, she is no longer tender in the LLQ on exam. Reviewed labs with patient. Will have patient continue her Augmentin. If diarrhea remains non bloody and non-mucoid, may use imodium or fiber to treat. Patient to call back if worsening. Will follow up with her maintenance colonoscopy which is due shortly.  2. Diarrhea,  unspecified type  See above.  Return if symptoms worsen or fail to improve.   Patient Instructions  Diverticulitis Diverticulitis is when small pockets that have formed in your colon (large intestine) become infected or swollen. Follow these instructions at home:  Follow your doctor's instructions.  Follow a special diet if told by your doctor.  When you feel better, your doctor may tell you to change your diet. You may be told to eat a lot of fiber. Fruits and vegetables are good sources of fiber. Fiber makes it easier to poop (have bowel movements).  Take supplements or probiotics as told by your doctor.  Only take medicines as told by your doctor.  Keep all follow-up visits with your doctor. Contact a doctor if:  Your pain does not get better.  You have a hard time eating food.  You are not pooping like normal. Get help right away if:  Your pain gets worse.  Your problems do not get better.  Your problems suddenly get worse.  You have a fever.  You keep throwing up (vomiting).  You have bloody or black, tarry poop (stool). This information is not intended to replace advice given to you by your health care provider. Make sure you discuss any questions you have with your health care provider. Document Released: 01/19/2008 Document Revised: 01/08/2016 Document Reviewed: 06/27/2013 Elsevier Interactive Patient Education  2017 [employer].    The entirety of the information documented in the History of Present Illness, Review of Systems and Physical Exam were personally obtained by me. Portions of this information were initially documented by Ashley Royalty, CMA and reviewed by me for thoroughness and accuracy.         Trinna Post, PA-C  [facility]

## 2016-09-06 ENCOUNTER — Other Ambulatory Visit: Payer: Self-pay | Admitting: Physician Assistant

## 2016-09-06 ENCOUNTER — Telehealth: Payer: Self-pay | Admitting: Physician Assistant

## 2016-09-06 DIAGNOSIS — T3695XA Adverse effect of unspecified systemic antibiotic, initial encounter: Principal | ICD-10-CM

## 2016-09-06 DIAGNOSIS — B379 Candidiasis, unspecified: Secondary | ICD-10-CM

## 2016-09-06 MED ORDER — FLUCONAZOLE 150 MG PO TABS: 150.0000 mg | ORAL_TABLET | Freq: Once | ORAL | 0 refills | Status: AC

## 2016-09-06 MED ORDER — FLUCONAZOLE 150 MG PO TABS: 150.0000 mg | ORAL_TABLET | Freq: Once | ORAL | 0 refills | Status: DC

## 2016-09-06 NOTE — Telephone Encounter (Signed)
Done

## 2016-09-06 NOTE — Telephone Encounter (Signed)
Sent in

## 2016-09-06 NOTE — Telephone Encounter (Signed)
Pt has taken 2 rounds of antibiotics and now has a yeast infection.  She would like a round of diflucan pills.  She uses [facility]

## 2016-09-06 NOTE — Telephone Encounter (Signed)
pts diflucan was sent to [facility] .  She wants it sent to [address] this time.  Please send to [facility] asap. So she can pick up tonight.  Con Memos

## 2016-11-30 ENCOUNTER — Other Ambulatory Visit: Payer: Self-pay | Admitting: Physician Assistant

## 2016-11-30 DIAGNOSIS — R030 Elevated blood-pressure reading, without diagnosis of hypertension: Secondary | ICD-10-CM

## 2016-11-30 MED ORDER — TRIAMTERENE-HCTZ 37.5-25 MG PO TABS: 1.0000 | ORAL_TABLET | Freq: Every day | ORAL | 1 refills | Status: DC

## 2016-11-30 NOTE — Telephone Encounter (Signed)
[place] pharmacy faxed a request on the following medication. Thanks CC  triamterene-hydrochlorothiazide (MAXZIDE-25) 37.5-25 MG tablet  Take one tablet by mouth once daily.

## 2016-12-06 ENCOUNTER — Other Ambulatory Visit: Payer: Self-pay | Admitting: Physician Assistant

## 2016-12-06 DIAGNOSIS — R030 Elevated blood-pressure reading, without diagnosis of hypertension: Secondary | ICD-10-CM

## 2016-12-06 MED ORDER — TRIAMTERENE-HCTZ 37.5-25 MG PO TABS: 1.0000 | ORAL_TABLET | Freq: Every day | ORAL | 1 refills | Status: DC

## 2016-12-06 NOTE — Telephone Encounter (Signed)
[facility] faxed a request for the following medication.  Thanks CC  triamterene-hydrochlorothiazide (MAXZIDE-25) 37.5-25 MG tablet  Take one tablet by mouth once daily.

## 2016-12-06 NOTE — Telephone Encounter (Signed)
Refill sent to [facility] [address]

## 2016-12-06 NOTE — Telephone Encounter (Signed)
Please review. Thanks!  

## 2016-12-24 ENCOUNTER — Other Ambulatory Visit: Payer: Self-pay | Admitting: Family Medicine

## 2016-12-24 ENCOUNTER — Other Ambulatory Visit: Payer: Self-pay | Admitting: Physician Assistant

## 2017-01-24 ENCOUNTER — Encounter: Payer: Self-pay | Admitting: General Surgery

## 2017-01-24 ENCOUNTER — Ambulatory Visit (INDEPENDENT_AMBULATORY_CARE_PROVIDER_SITE_OTHER): Payer: Managed Care, Other (non HMO) | Admitting: General Surgery

## 2017-01-24 VITALS — BP 132/68 | HR 74 | Resp 14 | Ht 67.0 in | Wt 260.0 lb

## 2017-01-24 DIAGNOSIS — Z1211 Encounter for screening for malignant neoplasm of colon: Secondary | ICD-10-CM

## 2017-01-24 MED ORDER — POLYETHYLENE GLYCOL 3350 17 GM/SCOOP PO POWD: ORAL | 0 refills | Status: DC

## 2017-01-24 NOTE — Progress Notes (Signed)
Patient ID: EDDIS PINGLETON, female   DOB: 1951-01-26, 66 y.o.   MRN: [id number]  Chief Complaint  Patient presents with  . Colonoscopy    HPI Rita Bailey is a 66 y.o. female Here today for a evaluation of a screening colonoscopy. Last colonoscopy was done on 01/20/2007. Patient states no GI problems at this time. Moves her bowels daily.   HPI  Past Medical History:  Diagnosis Date  . Allergy   . Arthritis   . Hypertension     Past Surgical History:  Procedure Laterality Date  . CESAREAN SECTION    . DILATATION & CURETTAGE/HYSTEROSCOPY WITH MYOSURE N/A 02/27/2016   Procedure: DILATATION & CURETTAGE/HYSTEROSCOPY WITH MYOSURE;  Surgeon: Boykin Nearing, MD;  Location: [facility] ORS;  Service: Gynecology;  Laterality: N/A;  . sebaceous syst removed  08/24/2011  . tonsillectomy    . TONSILLECTOMY      Family History  Problem Relation Age of Onset  . Diabetes Mother   . Hypertension Mother   . Diabetes Father   . Cerebral palsy Son     Social History Social History  Substance Use Topics  . Smoking status: Never Smoker  . Smokeless tobacco: Never Used  . Alcohol use No    No Known Allergies  Current Outpatient Prescriptions  Medication Sig Dispense Refill  . Cholecalciferol (D 2000) 2000 units TABS Take 1 tablet by mouth daily.    . fluticasone (FLONASE) 50 MCG/ACT nasal spray Place 2 sprays into both nostrils as needed.     . Multiple Vitamin (MULTI-VITAMINS) TABS Take 1 tablet by mouth daily.    Marland Kitchen triamterene-hydrochlorothiazide (MAXZIDE-25) 37.5-25 MG tablet Take 1 tablet by mouth daily. 90 tablet 1  . valACYclovir (VALTREX) 1000 MG tablet TAKE 2 TABLETS TWICE DAILY AS NEEDED 30 tablet 5  . polyethylene glycol powder (GLYCOLAX/MIRALAX) powder 255 grams one bottle for colonoscopy prep 255 g 0   No current facility-administered medications for this visit.     Review of Systems Review of Systems  Constitutional: Negative.   Respiratory: Negative.    Cardiovascular: Negative.   Gastrointestinal: Negative.     Blood pressure 132/68, pulse 74, resp. rate 14, height 5\' 7"  (1.702 m), weight 260 lb (117.9 kg).  Physical Exam Physical Exam  Constitutional: She is oriented to person, place, and time. She appears well-developed and well-nourished.  Cardiovascular: Normal rate, regular rhythm and normal heart sounds.   Pulmonary/Chest: Effort normal and breath sounds normal.  Neurological: She is alert and oriented to person, place, and time.  Skin: Skin is warm and dry.    Data Reviewed Colonoscopy dated 01/20/2007 reported a 10 mm meter polyp in the rectosigmoid. This was found to represent benign polypoid mucosa with hyperplastic, dilated crypts. No evidence of atypia or malignancy.  CBC dated 08/26/2016 showed a hemoglobin of 14.5 with an MCV of 86, white blood cell count 7100, platelet count 260,000.  Comprehensive metabolic panel of the same date showed normal electrolytes. Creatinine 0.8. Estimated GFR 74.  Assessment    Candidate for screening colonoscopy.    Plan       Colonoscopy with possible biopsy/polypectomy prn: Information regarding the procedure, including its potential risks and complications (including but not limited to perforation of the bowel, which may require emergency surgery to repair, and bleeding) was verbally given to the patient. Educational information regarding lower intestinal endoscopy was given to the patient. Written instructions for how to complete the bowel prep using Miralax were provided. The importance of  drinking ample fluids to avoid dehydration as a result of the prep emphasized.  HPI, Physical Exam, Assessment and Plan have been scribed under the direction and in the presence of Hervey Ard, MD.  Gaspar Cola, CMA  I have completed the exam and reviewed the above documentation for accuracy and completeness.  I agree with the above.  Haematologist has been used and any errors in  dictation or transcription are unintentional.  Hervey Ard, M.D., F.A.C.S.   Robert Bellow 01/24/2017, 9:04 PM  Patient has been scheduled for a colonoscopy on 03-23-17 at [facility]. Miralax prescription has been sent in to the patient's pharmacy today. Colonoscopy instructions have been reviewed with the patient. This patient is aware to call the office if they have further questions.   Dominga Ferry, CMA

## 2017-01-24 NOTE — Patient Instructions (Signed)
Colonoscopy, Adult A colonoscopy is an exam to look at the entire large intestine. During the exam, a lubricated, bendable tube is inserted into the anus and then passed into the rectum, colon, and other parts of the large intestine. A colonoscopy is often done as a part of normal colorectal screening or in response to certain symptoms, such as anemia, persistent diarrhea, abdominal pain, and blood in the stool. The exam can help screen for and diagnose medical problems, including:  Tumors.  Polyps.  Inflammation.  Areas of bleeding.  Tell a health care provider about:  Any allergies you have.  All medicines you are taking, including vitamins, herbs, eye drops, creams, and over-the-counter medicines.  Any problems you or family members have had with anesthetic medicines.  Any blood disorders you have.  Any surgeries you have had.  Any medical conditions you have.  Any problems you have had passing stool. What are the risks? Generally, this is a safe procedure. However, problems may occur, including:  Bleeding.  A tear in the intestine.  A reaction to medicines given during the exam.  Infection (rare).  What happens before the procedure? Eating and drinking restrictions Follow instructions from your health care provider about eating and drinking, which may include:  A few days before the procedure - follow a low-fiber diet. Avoid nuts, seeds, dried fruit, raw fruits, and vegetables.  1-3 days before the procedure - follow a clear liquid diet. Drink only clear liquids, such as clear broth or bouillon, black coffee or tea, clear juice, clear soft drinks or sports drinks, gelatin dessert, and popsicles. Avoid any liquids that contain red or purple dye.  On the day of the procedure - do not eat or drink anything during the 2 hours before the procedure, or within the time period that your health care provider recommends.  Bowel prep If you were prescribed an oral bowel prep  to clean out your colon:  Take it as told by your health care provider. Starting the day before your procedure, you will need to drink a large amount of medicated liquid. The liquid will cause you to have multiple loose stools until your stool is almost clear or light green.  If your skin or anus gets irritated from diarrhea, you may use these to relieve the irritation: ? Medicated wipes, such as adult wet wipes with aloe and vitamin E. ? A skin soothing-product like petroleum jelly.  If you vomit while drinking the bowel prep, take a break for up to 60 minutes and then begin the bowel prep again. If vomiting continues and you cannot take the bowel prep without vomiting, call your health care provider.  General instructions  Ask your health care provider about changing or stopping your regular medicines. This is especially important if you are taking diabetes medicines or blood thinners.  Plan to have someone take you home from the hospital or clinic. What happens during the procedure?  An IV tube may be inserted into one of your veins.  You will be given medicine to help you relax (sedative).  To reduce your risk of infection: ? Your health care team will wash or sanitize their hands. ? Your anal area will be washed with soap.  You will be asked to lie on your side with your knees bent.  Your health care provider will lubricate a long, thin, flexible tube. The tube will have a camera and a light on the end.  The tube will be inserted into your   anus.  The tube will be gently eased through your rectum and colon.  Air will be delivered into your colon to keep it open. You may feel some pressure or cramping.  The camera will be used to take images during the procedure.  A small tissue sample may be removed from your body to be examined under a microscope (biopsy). If any potential problems are found, the tissue will be sent to a lab for testing.  If small polyps are found, your  health care provider may remove them and have them checked for cancer cells.  The tube that was inserted into your anus will be slowly removed. The procedure may vary among health care providers and hospitals. What happens after the procedure?  Your blood pressure, heart rate, breathing rate, and blood oxygen level will be monitored until the medicines you were given have worn off.  Do not drive for 24 hours after the exam.  You may have a small amount of blood in your stool.  You may pass gas and have mild abdominal cramping or bloating due to the air that was used to inflate your colon during the exam.  It is up to you to get the results of your procedure. Ask your health care provider, or the department performing the procedure, when your results will be ready. This information is not intended to replace advice given to you by your health care provider. Make sure you discuss any questions you have with your health care provider. Document Released: 07/30/2000 Document Revised: 06/02/2016 Document Reviewed: 10/14/2015 Elsevier Interactive Patient Education  2018 [employer] Inc[employer]  

## 2017-03-16 ENCOUNTER — Other Ambulatory Visit: Payer: Self-pay | Admitting: *Deleted

## 2017-03-16 ENCOUNTER — Encounter: Payer: Self-pay | Admitting: *Deleted

## 2017-03-16 MED ORDER — POLYETHYLENE GLYCOL 3350 17 GM/SCOOP PO POWD: ORAL | 0 refills | Status: DC

## 2017-03-21 ENCOUNTER — Other Ambulatory Visit: Payer: Self-pay

## 2017-03-21 ENCOUNTER — Telehealth: Payer: Self-pay | Admitting: *Deleted

## 2017-03-21 ENCOUNTER — Telehealth: Payer: Self-pay | Admitting: Physician Assistant

## 2017-03-21 DIAGNOSIS — N898 Other specified noninflammatory disorders of vagina: Secondary | ICD-10-CM

## 2017-03-21 MED ORDER — FLUCONAZOLE 150 MG PO TABS: ORAL_TABLET | ORAL | 0 refills | Status: DC

## 2017-03-21 NOTE — Telephone Encounter (Signed)
Pt states she was seen at [facility] for a sinus infection.  Pt rec'd an antibiotic and is now having itching and a discharge.  Pt is requesting a Rx to help with this.  Pt has not contacted [facility] to request this.  [facility].  CB#[phone]

## 2017-03-21 NOTE — Telephone Encounter (Signed)
Patient called the office wanting to reschedule colonoscopy that was scheduled for 03-23-17 at [facility]. She states she has a sinus infection and a parent in the hospital.   This patient will be rescheduled to 05-11-17.

## 2017-03-21 NOTE — Telephone Encounter (Signed)
Cancelled rx at [facility] and resent to [facility]. Pt advised.

## 2017-03-21 NOTE — Telephone Encounter (Signed)
Pt called back saying she as for [facility].  She said [facility] sent to [facility] for her. Pt seems to be upset.   Pt's call back is [phone]  Thanks teri

## 2017-03-21 NOTE — Telephone Encounter (Signed)
Sent in diflucan to [facility] on [address]. If symptoms persist, she should come in to be evaluated.

## 2017-03-30 DIAGNOSIS — R269 Unspecified abnormalities of gait and mobility: Secondary | ICD-10-CM | POA: Insufficient documentation

## 2017-03-30 DIAGNOSIS — M419 Scoliosis, unspecified: Secondary | ICD-10-CM | POA: Insufficient documentation

## 2017-04-12 ENCOUNTER — Telehealth: Payer: Self-pay | Admitting: General Surgery

## 2017-04-12 ENCOUNTER — Encounter: Payer: Self-pay | Admitting: Family Medicine

## 2017-04-12 ENCOUNTER — Ambulatory Visit (INDEPENDENT_AMBULATORY_CARE_PROVIDER_SITE_OTHER): Payer: Managed Care, Other (non HMO) | Admitting: Family Medicine

## 2017-04-12 VITALS — BP 132/84 | HR 72 | Temp 98.7°F | Resp 16 | Ht 67.0 in | Wt 257.0 lb

## 2017-04-12 DIAGNOSIS — E669 Obesity, unspecified: Secondary | ICD-10-CM

## 2017-04-12 DIAGNOSIS — Z23 Encounter for immunization: Secondary | ICD-10-CM | POA: Diagnosis not present

## 2017-04-12 DIAGNOSIS — H9193 Unspecified hearing loss, bilateral: Secondary | ICD-10-CM | POA: Insufficient documentation

## 2017-04-12 DIAGNOSIS — Z0000 Encounter for general adult medical examination without abnormal findings: Secondary | ICD-10-CM

## 2017-04-12 DIAGNOSIS — IMO0001 Reserved for inherently not codable concepts without codable children: Secondary | ICD-10-CM

## 2017-04-12 DIAGNOSIS — Z1231 Encounter for screening mammogram for malignant neoplasm of breast: Secondary | ICD-10-CM | POA: Diagnosis not present

## 2017-04-12 DIAGNOSIS — Z6841 Body Mass Index (BMI) 40.0 and over, adult: Secondary | ICD-10-CM

## 2017-04-12 DIAGNOSIS — I10 Essential (primary) hypertension: Secondary | ICD-10-CM | POA: Diagnosis not present

## 2017-04-12 NOTE — Progress Notes (Signed)
Patient: Rita Bailey, Female    DOB: [birth year], 66 y.o.   MRN: [id number] Visit Date: 04/12/2017  Today's Provider: Lavon Paganini, MD   Chief Complaint  Patient presents with  . Annual Exam   Subjective:    Annual physical exam Rita Bailey is a 66 y.o. female who presents today for health maintenance and complete physical. She feels well. She reports exercising daily. She walks 10,000 steps per day. She reports she is sleeping fairly well.  Last CPE- 09/15/2015 Last pap- 01/09/2016- Negative. HPV negative. S/p endometrial ablation for polyp with benign pathology Last mammogram- 04/30/2015- BI-RADS 1 Last colonoscopy- 01/20/2007. Polyp. Has colonoscopy scheduled for 05/11/2017. -----------------------------------------------------------------   Review of Systems  Constitutional: Negative.   HENT: Positive for hearing loss (would like to have hearing checked) and tinnitus. Negative for congestion, dental problem, drooling, ear discharge, ear pain, facial swelling, mouth sores, nosebleeds, postnasal drip, rhinorrhea, sinus pain, sinus pressure, sneezing, sore throat, trouble swallowing and voice change.   Eyes: Negative.   Respiratory: Negative.   Cardiovascular: Negative.   Gastrointestinal: Negative.   Endocrine: Negative.   Genitourinary: Negative.   Musculoskeletal: Positive for gait problem (F/B ortho). Negative for arthralgias, back pain, joint swelling, myalgias, neck pain and neck stiffness.  Skin: Negative.   Allergic/Immunologic: Negative.   Neurological: Negative for dizziness, tremors, seizures, syncope, facial asymmetry, speech difficulty, weakness, light-headedness, numbness and headaches.  Hematological: Negative.   Psychiatric/Behavioral: Negative.     Social History      She  reports that she has never smoked. She has never used smokeless tobacco. She reports that she does not drink alcohol or use drugs.       Social History   Social  History  . Marital status: Married    Spouse name: Dorothyann Peng  . Number of children: 1  . Years of education: Bachelors   Occupational History  .  [employer]   Social History Main Topics  . Smoking status: Never Smoker  . Smokeless tobacco: Never Used  . Alcohol use No  . Drug use: No  . Sexual activity: Yes   Other Topics Concern  . None   Social History Narrative  . None    Past Medical History:  Diagnosis Date  . Allergy   . Arthritis   . Hypertension      Patient Active Problem List   Diagnosis Date Noted  . Encounter for screening colonoscopy 01/24/2017  . Muscle cramps 01/09/2016  . Allergic rhinitis 09/08/2015  . Hypertension 09/08/2015  . Fecal occult blood test positive 09/08/2015  . H/O infectious disease 09/08/2015  . Gonalgia 09/08/2015  . Adiposity 09/08/2015  . RAD (reactive airway disease) 09/08/2015  . Obesity 03/12/2015  . Current tear of meniscus 01/02/2015  . Arthritis of knee, degenerative 01/02/2015    Past Surgical History:  Procedure Laterality Date  . CESAREAN SECTION    . DILATATION & CURETTAGE/HYSTEROSCOPY WITH MYOSURE N/A 02/27/2016   Procedure: DILATATION & CURETTAGE/HYSTEROSCOPY WITH MYOSURE;  Surgeon: Boykin Nearing, MD;  Location: [facility] ORS;  Service: Gynecology;  Laterality: N/A;  . sebaceous syst removed  08/24/2011  . tonsillectomy    . TONSILLECTOMY      Family History        Family Status  Relation Status  . Mother Alive  . Father Alive       irregular pulse  . Son Alive  . MGM Deceased       asthma  . MGF  Deceased       lung cancer  . PGM Deceased       natural causes  . PGF Deceased       MI, alcoholic        Her family history includes Cerebral palsy in her son; Diabetes in her father and mother; Hypertension in her mother.     No Known Allergies   Current Outpatient Prescriptions:  .  Cholecalciferol (D 2000) 2000 units TABS, Take 1 tablet by mouth daily., Disp: , Rfl:  .  fluticasone  (FLONASE) 50 MCG/ACT nasal spray, Place 2 sprays into both nostrils as needed. , Disp: , Rfl:  .  Multiple Vitamin (MULTI-VITAMINS) TABS, Take 1 tablet by mouth daily., Disp: , Rfl:  .  triamcinolone cream (KENALOG) 0.1 %, Apply 1 application topically 2 (two) times daily. Apply to itch/rash BID for 10 days, then once daily for 10 days, and then 3 times per week for 4-5 weeks., Disp: , Rfl:  .  triamterene-hydrochlorothiazide (MAXZIDE-25) 37.5-25 MG tablet, Take 1 tablet by mouth daily., Disp: 90 tablet, Rfl: 1 .  valACYclovir (VALTREX) 1000 MG tablet, TAKE 2 TABLETS TWICE DAILY AS NEEDED, Disp: 30 tablet, Rfl: 5 .  polyethylene glycol powder (GLYCOLAX/MIRALAX) powder, 255 grams one bottle for colonoscopy prep (Patient not taking: Reported on 04/12/2017), Disp: 255 g, Rfl: 0   Patient Care Team: Mar Daring, PA-C as PCP - General (Family Medicine)      Objective:   Vitals: BP 132/84 (BP Location: Left Arm, Patient Position: Sitting, Cuff Size: Large)   Pulse 72   Temp 98.7 F (37.1 C) (Oral)   Resp 16   Ht 5\' 7"  (1.702 m)   Wt 257 lb (116.6 kg)   BMI 40.25 kg/m    Vitals:   04/12/17 0911  BP: 132/84  Pulse: 72  Resp: 16  Temp: 98.7 F (37.1 C)  TempSrc: Oral  Weight: 257 lb (116.6 kg)  Height: 5\' 7"  (1.702 m)     Physical Exam  Constitutional: She is oriented to person, place, and time. She appears well-developed and well-nourished. No distress.  HENT:  Head: Normocephalic and atraumatic.  Right Ear: Tympanic membrane, external ear and ear canal normal.  Left Ear: Tympanic membrane, external ear and ear canal normal.  Nose: Nose normal.  Mouth/Throat: Oropharynx is clear and moist.  Eyes: Conjunctivae and EOM are normal. No scleral icterus.  Neck: Neck supple. No thyromegaly present.  Cardiovascular: Normal rate, regular rhythm, normal heart sounds and intact distal pulses.   No murmur heard. Pulmonary/Chest: Effort normal and breath sounds normal. No  respiratory distress. She has no wheezes. She has no rales.  Breasts: breasts appear normal, no suspicious masses, no skin or nipple changes or axillary nodes.   Abdominal: Soft. Bowel sounds are normal. She exhibits no distension. There is no tenderness. There is no rebound and no guarding.  Musculoskeletal: She exhibits no edema or deformity.  Lymphadenopathy:    She has no cervical adenopathy.  Neurological: She is alert and oriented to person, place, and time.  Skin: Skin is warm and dry. No rash noted.  Psychiatric: She has a normal mood and affect. Her behavior is normal.  Vitals reviewed.    Depression Screen PHQ 2/9 Scores 04/12/2017 09/15/2015  PHQ - 2 Score 0 -  Exception Documentation - Patient refusal     Assessment & Plan:     Routine Health Maintenance and Physical Exam  Exercise Activities and Dietary recommendations Goals  None      Immunization History  Administered Date(s) Administered  . Influenza-Unspecified 05/17/2015  . Tdap 04/29/2009    Health Maintenance  Topic Date Due  . HIV Screening  07/06/1968  . COLONOSCOPY  01/19/2017  . INFLUENZA VACCINE  03/16/2017  . MAMMOGRAM  04/29/2017  . PAP SMEAR  01/09/2019  . TETANUS/TDAP  04/30/2019  . Hepatitis C Screening  Completed     Discussed health benefits of physical activity, and encouraged her to engage in regular exercise appropriate for her age and condition.   Healthcare maintenance UTD on pap smear Will check with insurance on Shingrix Vaccine Mammogram ordered Scheduled for colonoscopy Next CPE in 1 yr  Obesity Discussed diet and exercise and importance of healthy weight  Bilateral hearing loss Referral to audiology  Hypertension Well controlled Check CMP and lipid panel F/u in 6 months    --------------------------------------------------------------------  The entirety of the information documented in the History of Present Illness, Review of Systems and Physical Exam  were personally obtained by me. Portions of this information were initially documented by Raquel Sarna Ratchford, CMA and reviewed by me for thoroughness and accuracy.     Lavon Paganini, MD  [facility]

## 2017-04-12 NOTE — Assessment & Plan Note (Signed)
Discussed diet and exercise and importance of healthy weight

## 2017-04-12 NOTE — Assessment & Plan Note (Signed)
Referral to audiology.

## 2017-04-12 NOTE — Addendum Note (Signed)
Addended by: Brennan Bailey on: 04/12/2017 10:51 AM   Modules accepted: Orders

## 2017-04-12 NOTE — Patient Instructions (Signed)
Preventive Care 40-64 Years, Female Preventive care refers to lifestyle choices and visits with your health care provider that can promote health and wellness. What does preventive care include?  A yearly physical exam. This is also called an annual well check.  Dental exams once or twice a year.  Routine eye exams. Ask your health care provider how often you should have your eyes checked.  Personal lifestyle choices, including: ? Daily care of your teeth and gums. ? Regular physical activity. ? Eating a healthy diet. ? Avoiding tobacco and drug use. ? Limiting alcohol use. ? Practicing safe sex. ? Taking low-dose aspirin daily starting at age 65. ? Taking vitamin and mineral supplements as recommended by your health care provider. What happens during an annual well check? The services and screenings done by your health care provider during your annual well check will depend on your age, overall health, lifestyle risk factors, and family history of disease. Counseling Your health care provider may ask you questions about your:  Alcohol use.  Tobacco use.  Drug use.  Emotional well-being.  Home and relationship well-being.  Sexual activity.  Eating habits.  Work and work Statistician.  Method of birth control.  Menstrual cycle.  Pregnancy history.  Screening You may have the following tests or measurements:  Height, weight, and BMI.  Blood pressure.  Lipid and cholesterol levels. These may be checked every 5 years, or more frequently if you are over 45 years old.  Skin check.  Lung cancer screening. You may have this screening every year starting at age 39 if you have a 30-pack-year history of smoking and currently smoke or have quit within the past 15 years.  Fecal occult blood test (FOBT) of the stool. You may have this test every year starting at age 86.  Flexible sigmoidoscopy or colonoscopy. You may have a sigmoidoscopy every 5 years or a colonoscopy  every 10 years starting at age 55.  Hepatitis C blood test.  Hepatitis B blood test.  Sexually transmitted disease (STD) testing.  Diabetes screening. This is done by checking your blood sugar (glucose) after you have not eaten for a while (fasting). You may have this done every 1-3 years.  Mammogram. This may be done every 1-2 years. Talk to your health care provider about when you should start having regular mammograms. This may depend on whether you have a family history of breast cancer.  BRCA-related cancer screening. This may be done if you have a family history of breast, ovarian, tubal, or peritoneal cancers.  Pelvic exam and Pap test. This may be done every 3 years starting at age 79. Starting at age 72, this may be done every 5 years if you have a Pap test in combination with an HPV test.  Bone density scan. This is done to screen for osteoporosis. You may have this scan if you are at high risk for osteoporosis.  Discuss your test results, treatment options, and if necessary, the need for more tests with your health care provider. Vaccines Your health care provider may recommend certain vaccines, such as:  Influenza vaccine. This is recommended every year.  Tetanus, diphtheria, and acellular pertussis (Tdap, Td) vaccine. You may need a Td booster every 10 years.  Varicella vaccine. You may need this if you have not been vaccinated.  Zoster vaccine. You may need this after age 24.  Measles, mumps, and rubella (MMR) vaccine. You may need at least one dose of MMR if you were born in  1957 or later. You may also need a second dose.  Pneumococcal 13-valent conjugate (PCV13) vaccine. You may need this if you have certain conditions and were not previously vaccinated.  Pneumococcal polysaccharide (PPSV23) vaccine. You may need one or two doses if you smoke cigarettes or if you have certain conditions.  Meningococcal vaccine. You may need this if you have certain  conditions.  Hepatitis A vaccine. You may need this if you have certain conditions or if you travel or work in places where you may be exposed to hepatitis A.  Hepatitis B vaccine. You may need this if you have certain conditions or if you travel or work in places where you may be exposed to hepatitis B.  Haemophilus influenzae type b (Hib) vaccine. You may need this if you have certain conditions.  Talk to your health care provider about which screenings and vaccines you need and how often you need them. This information is not intended to replace advice given to you by your health care provider. Make sure you discuss any questions you have with your health care provider. Document Released: 08/29/2015 Document Revised: 04/21/2016 Document Reviewed: 06/03/2015 Elsevier Interactive Patient Education  2017 [employer].

## 2017-04-12 NOTE — Telephone Encounter (Signed)
PATIENT JUST SAW HE PRIMARY CARE DOCTOR(DR ANGELA BACIGALUPO/[place] [facility])AND SHE WOULD LIKE TO ADD AN UPPER ENDOSCOPY TO THE ALREADY SCHEDULED COLONOSCOPY ON 04-10-17 BECAUSE THE PATIENT SOMETIMES HAS TROUBLING WITH HER FOOD HANGING.PLEASE ADVISE. PLEASE CALL PATIENT AT [phone].

## 2017-04-12 NOTE — Assessment & Plan Note (Signed)
UTD on pap smear Will check with insurance on Shingrix Vaccine Mammogram ordered Scheduled for colonoscopy Next CPE in 1 yr

## 2017-04-12 NOTE — Assessment & Plan Note (Addendum)
Well controlled Check CMP and lipid panel F/u in 6 months

## 2017-04-13 ENCOUNTER — Telehealth: Payer: Self-pay

## 2017-04-13 LAB — COMPREHENSIVE METABOLIC PANEL
A/G RATIO: 1.7 (ref 1.2–2.2)
ALBUMIN: 4.2 g/dL (ref 3.6–4.8)
ALK PHOS: 63 IU/L (ref 39–117)
ALT: 21 IU/L (ref 0–32)
AST: 26 IU/L (ref 0–40)
BILIRUBIN TOTAL: 0.6 mg/dL (ref 0.0–1.2)
BUN / CREAT RATIO: 18 (ref 12–28)
BUN: 17 mg/dL (ref 8–27)
CHLORIDE: 104 mmol/L (ref 96–106)
CO2: 24 mmol/L (ref 20–29)
Calcium: 9.7 mg/dL (ref 8.7–10.3)
Creatinine, Ser: 0.96 mg/dL (ref 0.57–1.00)
GFR calc non Af Amer: 63 mL/min/{1.73_m2} (ref >59)
GFR, EST AFRICAN AMERICAN: 73 mL/min/{1.73_m2} (ref >59)
GLOBULIN, TOTAL: 2.5 g/dL (ref 1.5–4.5)
Glucose: 106 mg/dL — ABNORMAL HIGH (ref 65–99)
POTASSIUM: 3.6 mmol/L (ref 3.5–5.2)
SODIUM: 143 mmol/L (ref 134–144)
TOTAL PROTEIN: 6.7 g/dL (ref 6.0–8.5)

## 2017-04-13 LAB — CBC WITH DIFFERENTIAL/PLATELET
BASOS: 1 %
Basophils Absolute: 0 10*3/uL (ref 0.0–0.2)
EOS (ABSOLUTE): 0.3 10*3/uL (ref 0.0–0.4)
Eos: 5 %
HEMOGLOBIN: 14.9 g/dL (ref 11.1–15.9)
Hematocrit: 43.7 % (ref 34.0–46.6)
IMMATURE GRANS (ABS): 0 10*3/uL (ref 0.0–0.1)
Immature Granulocytes: 0 %
LYMPHS ABS: 2.4 10*3/uL (ref 0.7–3.1)
LYMPHS: 36 %
MCH: 30 pg (ref 26.6–33.0)
MCHC: 34.1 g/dL (ref 31.5–35.7)
MCV: 88 fL (ref 79–97)
MONOCYTES: 9 %
Monocytes Absolute: 0.6 10*3/uL (ref 0.1–0.9)
NEUTROS ABS: 3.3 10*3/uL (ref 1.4–7.0)
Neutrophils: 49 %
Platelets: 210 10*3/uL (ref 150–379)
RBC: 4.97 x10E6/uL (ref 3.77–5.28)
RDW: 13.7 % (ref 12.3–15.4)
WBC: 6.6 10*3/uL (ref 3.4–10.8)

## 2017-04-13 LAB — LIPID PANEL
CHOLESTEROL TOTAL: 180 mg/dL (ref 100–199)
Chol/HDL Ratio: 2.6 ratio (ref 0.0–4.4)
HDL: 68 mg/dL (ref >39)
LDL Calculated: 85 mg/dL (ref 0–99)
Triglycerides: 137 mg/dL (ref 0–149)
VLDL CHOLESTEROL CAL: 27 mg/dL (ref 5–40)

## 2017-04-13 NOTE — Telephone Encounter (Signed)
-----   Message from Virginia Crews, MD sent at 04/13/2017 10:24 AM EDT ----- Normal Blood counts, kidney function, liver function, electrolytes, cholesterol.  Virginia Crews, MD, MPH [facility] 04/13/2017 10:24 AM

## 2017-04-13 NOTE — Telephone Encounter (Signed)
LMTCB 04/13/2017  Thanks,   -Mickel Baas

## 2017-04-14 NOTE — Telephone Encounter (Signed)
PCP has requested EGD at time of colonoscopy in September. Contact patient to see if she is amenable to this plan. If so, add EGD to requested procedure and notify Endo unit.

## 2017-04-14 NOTE — Telephone Encounter (Signed)
Patient contacted today and agreeable to having an upper endoscopy at time of colonoscopy which is scheduled for 05-11-17.  Mark in Endoscopy gave the approval to add this on since we would be going over our block time.   Patient notified.

## 2017-04-15 ENCOUNTER — Other Ambulatory Visit: Payer: Self-pay | Admitting: General Surgery

## 2017-04-15 DIAGNOSIS — K219 Gastro-esophageal reflux disease without esophagitis: Secondary | ICD-10-CM

## 2017-04-15 NOTE — Telephone Encounter (Signed)
LMTCB 04/15/2017  Thanks,   -Mordecai Tindol  

## 2017-04-15 NOTE — Progress Notes (Signed)
Patient with long history of reflux PCP requested EGD at time of colonoscopy.

## 2017-04-22 ENCOUNTER — Other Ambulatory Visit: Payer: Self-pay | Admitting: Emergency Medicine

## 2017-04-22 MED ORDER — TRIAMCINOLONE ACETONIDE 0.1 % EX CREA: 1.0000 "application " | TOPICAL_CREAM | Freq: Two times a day (BID) | CUTANEOUS | 3 refills | Status: DC

## 2017-04-22 NOTE — Telephone Encounter (Signed)
Refilled triamcinolone cream.  I'm not sure if this is what we discussed or not, but this was the only cream on her medication list.  Virginia Crews, MD, MPH [facility] 04/22/2017 11:03 AM

## 2017-04-22 NOTE — Progress Notes (Signed)
Resent to correct pharmacy, cancelled at [facility]

## 2017-04-22 NOTE — Telephone Encounter (Signed)
Informed pt of results. She was asking about a cream that suppose to be sent in for her, I do not see that it was. Please advise.thanks.

## 2017-05-05 ENCOUNTER — Encounter: Payer: Self-pay | Admitting: *Deleted

## 2017-05-06 DIAGNOSIS — S76011A Strain of muscle, fascia and tendon of right hip, initial encounter: Secondary | ICD-10-CM | POA: Insufficient documentation

## 2017-05-09 ENCOUNTER — Telehealth: Payer: Self-pay | Admitting: *Deleted

## 2017-05-09 NOTE — Telephone Encounter (Signed)
Message left for patient on home number.   Patient is scheduled for a colonoscopy with Dr. Bary Castilla on Wednesday, 05-11-17. Just wanted to see if patient has had a change in her medications or health since her last office visit. Also, if she has picked up her Miralax prescription.  My Chart message sent to patient last week with no response.   Message left for patient to call the office or follow up through My Chart.

## 2017-05-11 ENCOUNTER — Ambulatory Visit: Payer: Managed Care, Other (non HMO) | Admitting: Anesthesiology

## 2017-05-11 ENCOUNTER — Ambulatory Visit
Admission: RE | Admit: 2017-05-11 | Discharge: 2017-05-11 | Disposition: A | Discharge status: Home / Self Care | Payer: Managed Care, Other (non HMO) | Source: Ambulatory Visit | Attending: General Surgery | Admitting: General Surgery

## 2017-05-11 ENCOUNTER — Encounter
Admission: RE | Disposition: A | Discharge status: Home / Self Care | Payer: Self-pay | Source: Ambulatory Visit | Attending: General Surgery

## 2017-05-11 DIAGNOSIS — Z6839 Body mass index (BMI) 39.0-39.9, adult: Secondary | ICD-10-CM | POA: Diagnosis not present

## 2017-05-11 DIAGNOSIS — Z79899 Other long term (current) drug therapy: Secondary | ICD-10-CM | POA: Insufficient documentation

## 2017-05-11 DIAGNOSIS — K2960 Other gastritis without bleeding: Secondary | ICD-10-CM | POA: Diagnosis not present

## 2017-05-11 DIAGNOSIS — K2940 Chronic atrophic gastritis without bleeding: Secondary | ICD-10-CM | POA: Diagnosis not present

## 2017-05-11 DIAGNOSIS — Z1211 Encounter for screening for malignant neoplasm of colon: Secondary | ICD-10-CM | POA: Insufficient documentation

## 2017-05-11 DIAGNOSIS — D124 Benign neoplasm of descending colon: Secondary | ICD-10-CM | POA: Diagnosis not present

## 2017-05-11 DIAGNOSIS — I10 Essential (primary) hypertension: Secondary | ICD-10-CM | POA: Diagnosis not present

## 2017-05-11 DIAGNOSIS — K5730 Diverticulosis of large intestine without perforation or abscess without bleeding: Secondary | ICD-10-CM | POA: Insufficient documentation

## 2017-05-11 DIAGNOSIS — D125 Benign neoplasm of sigmoid colon: Secondary | ICD-10-CM | POA: Insufficient documentation

## 2017-05-11 DIAGNOSIS — K228 Other specified diseases of esophagus: Secondary | ICD-10-CM | POA: Diagnosis not present

## 2017-05-11 HISTORY — PX: COLONOSCOPY WITH PROPOFOL: SHX5780

## 2017-05-11 HISTORY — PX: ESOPHAGOGASTRODUODENOSCOPY (EGD) WITH PROPOFOL: SHX5813

## 2017-05-11 SURGERY — COLONOSCOPY WITH PROPOFOL
Anesthesia: General

## 2017-05-11 MED ORDER — PROPOFOL 10 MG/ML IV BOLUS
INTRAVENOUS | Status: AC
  Filled 2017-05-11: qty 20

## 2017-05-11 MED ORDER — LIDOCAINE HCL (PF) 2 % IJ SOLN
INTRAMUSCULAR | Status: AC
  Filled 2017-05-11: qty 2

## 2017-05-11 MED ORDER — PROPOFOL 10 MG/ML IV BOLUS
INTRAVENOUS | Status: DC | PRN
  Administered 2017-05-11: 30 mg via INTRAVENOUS

## 2017-05-11 MED ORDER — PROPOFOL 500 MG/50ML IV EMUL
INTRAVENOUS | Status: DC | PRN
  Administered 2017-05-11: 120 ug/kg/min via INTRAVENOUS

## 2017-05-11 MED ORDER — SODIUM CHLORIDE 0.9 % IV SOLN
INTRAVENOUS | Status: DC
  Administered 2017-05-11: 08:00:00 via INTRAVENOUS

## 2017-05-11 MED ORDER — LIDOCAINE HCL (CARDIAC) 20 MG/ML IV SOLN
INTRAVENOUS | Status: DC | PRN
  Administered 2017-05-11: 2 mg via INTRAVENOUS

## 2017-05-11 NOTE — Anesthesia Post-op Follow-up Note (Signed)
Anesthesia QCDR form completed.        

## 2017-05-11 NOTE — Op Note (Signed)
[facility] Patient Name: Rita Bailey Procedure Date: 05/11/2017 8:12 AM MRN: [id number] Account #: [id number] Date of Birth: 12/08/1950 Admit Type: Outpatient Age: 66 Room: [facility] ENDO ROOM 4 Gender: Female Note Status: Finalized Procedure:            Colonoscopy Indications:          Screening for colorectal malignant neoplasm Providers:            Robert Bellow, MD Referring MD:         Dionne Bucy. Bacigalupo (Referring MD) Medicines:            Monitored Anesthesia Care Complications:        No immediate complications. Procedure:            Pre-Anesthesia Assessment:                       - Prior to the procedure, a History and Physical was                        performed, and patient medications, allergies and                        sensitivities were reviewed. The patient's tolerance of                        previous anesthesia was reviewed.                       - The risks and benefits of the procedure and the                        sedation options and risks were discussed with the                        patient. All questions were answered and informed                        consent was obtained.                       After obtaining informed consent, the colonoscope was                        passed under direct vision. Throughout the procedure,                        the patient's blood pressure, pulse, and oxygen                        saturations were monitored continuously. The                        Colonoscope was introduced through the anus and                        advanced to the the cecum, identified by appendiceal                        orifice and ileocecal valve. The colonoscopy was  somewhat difficult due to significant looping and a                        tortuous colon. Successful completion of the procedure                        was aided by using manual pressure. The patient              tolerated the procedure well. The quality of the bowel                        preparation was good. Findings:      Two sessile polyps were found in the sigmoid colon and descending colon.       The polyps were 6 mm in size. These were biopsied with a cold forceps       for histology.      A single small-mouthed diverticulum was found in the descending colon       and distal descending colon.      The retroflexed view of the distal rectum and anal verge was normal and       showed no anal or rectal abnormalities. Impression:           - Two 6 mm polyps in the sigmoid colon and in the                        descending colon. Biopsied.                       - Diverticulosis in the descending colon and in the                        distal descending colon.                       - The distal rectum and anal verge are normal on                        retroflexion view. Recommendation:       - Telephone endoscopist for pathology results in 1 week. Procedure Code(s):    --- Professional ---                       [phone], Colonoscopy, flexible; with biopsy, single or                        multiple Diagnosis Code(s):    --- Professional ---                       K57.30, Diverticulosis of large intestine without                        perforation or abscess without bleeding                       D12.4, Benign neoplasm of descending colon                       D12.5, Benign neoplasm of sigmoid colon  Z12.11, Encounter for screening for malignant neoplasm                        of colon CPT copyright 2016 American Medical Association. All rights reserved. The codes documented in this report are preliminary and upon coder review may  be revised to meet current compliance requirements. Robert Bellow, MD 05/11/2017 9:09:17 AM This report has been signed electronically. Number of Addenda: 0 Note Initiated On: 05/11/2017 8:12 AM Scope Withdrawal Time: 0 hours 10  minutes 17 seconds  Total Procedure Duration: 0 hours 27 minutes 25 seconds       [facility]

## 2017-05-11 NOTE — Op Note (Signed)
[facility] Patient Name: Rita Bailey Procedure Date: 05/11/2017 8:13 AM MRN: [id number] Account #: [id number] Date of Birth: 01-02-1951 Admit Type: Outpatient Age: 66 Room: [facility] ENDO ROOM 4 Gender: Female Note Status: Finalized Procedure:            Upper GI endoscopy Indications:          Dysphagia Providers:            Robert Bellow, MD Referring MD:         Dionne Bucy. Bacigalupo (Referring MD) Medicines:            Monitored Anesthesia Care Complications:        No immediate complications. Procedure:            Pre-Anesthesia Assessment:                       - Prior to the procedure, a History and Physical was                        performed, and patient medications, allergies and                        sensitivities were reviewed. The patient's tolerance of                        previous anesthesia was reviewed.                       - The risks and benefits of the procedure and the                        sedation options and risks were discussed with the                        patient. All questions were answered and informed                        consent was obtained.                       After obtaining informed consent, the endoscope was                        passed under direct vision. Throughout the procedure,                        the patient's blood pressure, pulse, and oxygen                        saturations were monitored continuously. The Endoscope                        was introduced through the mouth, and advanced to the                        third part of duodenum. The upper GI endoscopy was                        accomplished without difficulty. The patient tolerated  the procedure well. Findings:      Diffuse mild mucosal changes characterized by granularity, longitudinal       markings and altered texture were found in the middle third of the       esophagus and in the lower third  of the esophagus. Biopsies were taken       with a cold forceps for histology. Biopsies at 28 and 35 cm.      Diffuse mild inflammation characterized by erythema was found in the       entire examined stomach. Biopsies were taken with a cold forceps for       histology from the antrum.      Patchy mild inflammation was found in the duodenal bulb. Impression:           - Granular, longitudinally marked, texture changed                        mucosa in the esophagus. Biopsied.                       - Chronic atrophic gastritis. Biopsied.                       - Chronic duodenitis. Recommendation:       - Perform a colonoscopy tomorrow. Procedure Code(s):    --- Professional ---                       [phone], Esophagogastroduodenoscopy, flexible, transoral;                        with biopsy, single or multiple Diagnosis Code(s):    --- Professional ---                       R13.10, Dysphagia, unspecified                       K29.80, Duodenitis without bleeding                       K29.40, Chronic atrophic gastritis without bleeding                       K22.8, Other specified diseases of esophagus CPT copyright 2016 American Medical Association. All rights reserved. The codes documented in this report are preliminary and upon coder review may  be revised to meet current compliance requirements. Robert Bellow, MD 05/11/2017 8:35:50 AM This report has been signed electronically. Number of Addenda: 0 Note Initiated On: 05/11/2017 8:13 AM      [facility]

## 2017-05-11 NOTE — Anesthesia Preprocedure Evaluation (Signed)
Anesthesia Evaluation  Patient identified by MRN, date of birth, ID band Patient awake    Reviewed: Allergy & Precautions, NPO status , Patient's Chart, lab work & pertinent test results  Airway Mallampati: II       Dental  (+) Teeth Intact   Pulmonary neg pulmonary ROS,     + decreased breath sounds      Cardiovascular hypertension, Pt. on medications  Rhythm:Regular     Neuro/Psych    GI/Hepatic negative GI ROS, Neg liver ROS,   Endo/Other  negative endocrine ROSMorbid obesity  Renal/GU negative Renal ROS     Musculoskeletal   Abdominal (+) + obese,   Peds negative pediatric ROS (+)  Hematology negative hematology ROS (+)   Anesthesia Other Findings   Reproductive/Obstetrics                             Anesthesia Physical Anesthesia Plan  ASA: II  Anesthesia Plan: General   Post-op Pain Management:    Induction: Intravenous  PONV Risk Score and Plan: 0  Airway Management Planned: Natural Airway and Nasal Cannula  Additional Equipment:   Intra-op Plan:   Post-operative Plan:   Informed Consent: I have reviewed the patients History and Physical, chart, labs and discussed the procedure including the risks, benefits and alternatives for the proposed anesthesia with the patient or authorized representative who has indicated his/her understanding and acceptance.     Plan Discussed with: Surgeon  Anesthesia Plan Comments:         Anesthesia Quick Evaluation

## 2017-05-11 NOTE — Anesthesia Postprocedure Evaluation (Signed)
Anesthesia Post Note  Patient: Rita Bailey  Procedure(s) Performed: Procedure(s) (LRB): COLONOSCOPY WITH PROPOFOL (N/A) ESOPHAGOGASTRODUODENOSCOPY (EGD) WITH PROPOFOL (N/A)  Patient location during evaluation: PACU Anesthesia Type: General Level of consciousness: awake Pain management: pain level controlled Vital Signs Assessment: post-procedure vital signs reviewed and stable Respiratory status: spontaneous breathing Cardiovascular status: stable Anesthetic complications: no     Last Vitals:  Vitals:   05/11/17 0930 05/11/17 0940  BP: 133/82 139/67  Pulse: 62 60  Resp: 17 16  Temp:    SpO2: 100% 100%    Last Pain:  Vitals:   05/11/17 0910  TempSrc: Tympanic                 VAN STAVEREN,Rukaya Kleinschmidt

## 2017-05-11 NOTE — Transfer of Care (Signed)
Immediate Anesthesia Transfer of Care Note  Patient: Rita Bailey  Procedure(s) Performed: Procedure(s): COLONOSCOPY WITH PROPOFOL (N/A) ESOPHAGOGASTRODUODENOSCOPY (EGD) WITH PROPOFOL (N/A)  Patient Location: PACU  Anesthesia Type:General  Level of Consciousness: awake and alert   Airway & Oxygen Therapy: Patient Spontanous Breathing and Patient connected to nasal cannula oxygen  Post-op Assessment: Report given to RN and Post -op Vital signs reviewed and stable  Post vital signs: Reviewed  Last Vitals:  Vitals:   05/11/17 0749  BP: 140/82  Pulse: 74  Resp: 20  Temp: (!) 36.2 C  SpO2: 100%    Last Pain:  Vitals:   05/11/17 0749  TempSrc: Tympanic         Complications: No apparent anesthesia complications

## 2017-05-11 NOTE — H&P (Signed)
ABBAGAIL SCAFF [id number] Jul 18, 1951     HPI: 66 y.o for screening colonoscopy. Since June exam reported to PCP episodic difficulty with swallowing, especially dry foods (bread, rice, dry chicken). EGD requested by PCP. Tolerated prep well but did experience some vomiting at the end.   Prescriptions Prior to Admission  Medication Sig Dispense Refill Last Dose  . Cholecalciferol (D 2000) 2000 units TABS Take 1 tablet by mouth daily.   05/10/2017 at Unknown time  . Multiple Vitamin (MULTI-VITAMINS) TABS Take 1 tablet by mouth daily.   05/10/2017 at Unknown time  . triamcinolone cream (KENALOG) 0.1 % Apply 1 application topically 2 (two) times daily. 45 g 3 05/11/2017 at Unknown time  . triamterene-hydrochlorothiazide (MAXZIDE-25) 37.5-25 MG tablet Take 1 tablet by mouth daily. 90 tablet 1 05/10/2017 at Unknown time  . fluticasone (FLONASE) 50 MCG/ACT nasal spray Place 2 sprays into both nostrils as needed.    05/07/2017  . polyethylene glycol powder (GLYCOLAX/MIRALAX) powder 255 grams one bottle for colonoscopy prep (Patient not taking: Reported on 04/12/2017) 255 g 0 Not Taking  . valACYclovir (VALTREX) 1000 MG tablet TAKE 2 TABLETS TWICE DAILY AS NEEDED 30 tablet 5 03/10/2017   No Known Allergies Past Medical History:  Diagnosis Date  . Allergy   . Arthritis   . Hypertension    Past Surgical History:  Procedure Laterality Date  . CESAREAN SECTION    . DILATATION & CURETTAGE/HYSTEROSCOPY WITH MYOSURE N/A 02/27/2016   Procedure: DILATATION & CURETTAGE/HYSTEROSCOPY WITH MYOSURE;  Surgeon: Boykin Nearing, MD;  Location: [facility];  Service: Gynecology;  Laterality: N/A;  . sebaceous syst removed  08/24/2011  . tonsillectomy    . TONSILLECTOMY     Social History   Social History  . Marital status: Married    Spouse name: Dorothyann Peng  . Number of children: 1  . Years of education: Bachelors   Occupational History  .  [employer]   Social History Main Topics  . Smoking status:  Never Smoker  . Smokeless tobacco: Never Used  . Alcohol use Yes     Comment: none last 24hrs  . Drug use: No  . Sexual activity: Yes   Other Topics Concern  . Not on file   Social History Narrative  . No narrative on file   Social History   Social History Narrative  . No narrative on file     ROS: Negative.     PE: HEENT: Negative. Lungs: Clear. Cardio: RR.  Assessment/Plan:  Proceed with planned upper and lower endoscopy.   Robert Bellow 05/11/2017

## 2017-05-12 ENCOUNTER — Encounter: Payer: Self-pay | Admitting: General Surgery

## 2017-05-12 LAB — SURGICAL PATHOLOGY

## 2017-05-13 ENCOUNTER — Telehealth: Payer: Self-pay

## 2017-05-13 ENCOUNTER — Encounter: Payer: Self-pay | Admitting: Family Medicine

## 2017-05-13 DIAGNOSIS — K2100 Gastro-esophageal reflux disease with esophagitis, without bleeding: Secondary | ICD-10-CM | POA: Insufficient documentation

## 2017-05-13 NOTE — Telephone Encounter (Signed)
-----   Message from Robert Bellow, MD sent at 05/13/2017  9:51 AM EDT ----- A follow-up appointment in 2 weeks would be appropriate. Thank you

## 2017-05-16 NOTE — Telephone Encounter (Signed)
Patient called you back. I let her know that Dr.Byrnett wanted her to have an appointment in 2 weeks, but patient wanted you to call her back since I was unsure to as why he wanted to see her back.

## 2017-05-30 ENCOUNTER — Ambulatory Visit (INDEPENDENT_AMBULATORY_CARE_PROVIDER_SITE_OTHER): Payer: Managed Care, Other (non HMO) | Admitting: General Surgery

## 2017-05-30 ENCOUNTER — Encounter: Payer: Self-pay | Admitting: General Surgery

## 2017-05-30 VITALS — BP 130/78 | HR 70 | Resp 14 | Ht 67.0 in | Wt 257.0 lb

## 2017-05-30 DIAGNOSIS — K2100 Gastro-esophageal reflux disease with esophagitis, without bleeding: Secondary | ICD-10-CM

## 2017-05-30 MED ORDER — LANSOPRAZOLE 30 MG PO CPDR: 30.0000 mg | DELAYED_RELEASE_CAPSULE | Freq: Every day | ORAL | 0 refills | Status: DC

## 2017-05-30 NOTE — Patient Instructions (Addendum)
Patient to use my chart [place] in two weeks and six weeks about the burning

## 2017-05-30 NOTE — Progress Notes (Signed)
Patient ID: Rita Bailey, female   DOB: 05-04-1951, 66 y.o.   MRN: [id number]  Chief Complaint  Patient presents with  . Follow-up    HPI Rita Bailey is a 66 y.o. female here today for follow up upper/lower endoscopy don e on 05/11/2017. Patient states she is doing well. Patient does has trouble swallowing.  HPI  Past Medical History:  Diagnosis Date  . Allergy   . Arthritis   . Hypertension     Past Surgical History:  Procedure Laterality Date  . CESAREAN SECTION    . COLONOSCOPY WITH PROPOFOL N/A 05/11/2017   Procedure: COLONOSCOPY WITH PROPOFOL;  Surgeon: Robert Bellow, MD;  Location: [facility];  Service: Endoscopy;  Laterality: N/A;  . DILATATION & CURETTAGE/HYSTEROSCOPY WITH MYOSURE N/A 02/27/2016   Procedure: DILATATION & CURETTAGE/HYSTEROSCOPY WITH MYOSURE;  Surgeon: Boykin Nearing, MD;  Location: [facility];  Service: Gynecology;  Laterality: N/A;  . ESOPHAGOGASTRODUODENOSCOPY (EGD) WITH PROPOFOL N/A 05/11/2017   Procedure: ESOPHAGOGASTRODUODENOSCOPY (EGD) WITH PROPOFOL;  Surgeon: Robert Bellow, MD;  Location: [facility];  Service: Endoscopy;  Laterality: N/A;  . sebaceous syst removed  08/24/2011  . tonsillectomy    . TONSILLECTOMY      Family History  Problem Relation Age of Onset  . Diabetes Mother   . Hypertension Mother   . Diabetes Father   . Cerebral palsy Son     Social History Social History  Substance Use Topics  . Smoking status: Never Smoker  . Smokeless tobacco: Never Used  . Alcohol use Yes     Comment: none last 24hrs    No Known Allergies  Current Outpatient Prescriptions  Medication Sig Dispense Refill  . Cholecalciferol (D 2000) 2000 units TABS Take 1 tablet by mouth daily.    . fluticasone (FLONASE) 50 MCG/ACT nasal spray Place 2 sprays into both nostrils as needed.     . Multiple Vitamin (MULTI-VITAMINS) TABS Take 1 tablet by mouth daily.    Marland Kitchen omeprazole (PRILOSEC) 40 MG capsule Take 40 mg by mouth daily.     . polyethylene glycol powder (GLYCOLAX/MIRALAX) powder 255 grams one bottle for colonoscopy prep 255 g 0  . triamcinolone cream (KENALOG) 0.1 % Apply 1 application topically 2 (two) times daily. 45 g 3  . triamterene-hydrochlorothiazide (MAXZIDE-25) 37.5-25 MG tablet Take 1 tablet by mouth daily. 90 tablet 1  . valACYclovir (VALTREX) 1000 MG tablet TAKE 2 TABLETS TWICE DAILY AS NEEDED 30 tablet 5  . lansoprazole (PREVACID) 30 MG capsule Take 1 capsule (30 mg total) by mouth daily at 12 noon. 60 capsule 0   No current facility-administered medications for this visit.     Review of Systems Review of Systems  Constitutional: Negative.   Respiratory: Negative.   Cardiovascular: Negative.     Blood pressure 130/78, pulse 70, resp. rate 14, height 5\' 7"  (1.702 m), weight 257 lb (116.6 kg).  Physical Exam Physical Exam  Data Reviewed 05/11/2017 upper and lower endoscopy biopsy results:  DIAGNOSIS:  A. STOMACH, ANTRUM; COLD BIOPSY:  - ANTRAL AND OXYNTIC MUCOSA WITH MILD CHRONIC GASTRITIS.  - NEGATIVE FOR H. PYLORI, DYSPLASIA AND MALIGNANCY.   B. DISTAL ESOPHAGUS, 35 CM; COLD BIOPSY:  - ESOPHAGITIS, CONSISTENT WITH REFLUX.  - NEGATIVE FOR DYSPLASIA AND MALIGNANCY.   C. ESOPHAGUS, 25 CM; COLD BIOPSY:  - ESOPHAGITIS, CONSISTENT WITH REFLUX.  - NEGATIVE FOR DYSPLASIA AND MALIGNANCY.   D. COLON POLYP, DESCENDING; COLD BIOPSY:  - TUBULAR ADENOMA, 3 FRAGMENTS.  - NEGATIVE FOR  HIGH-GRADE DYSPLASIA AND MALIGNANCY.   E. COLON POLYP, SIGMOID; COLD BIOPSY:  - TUBULAR ADENOMA, 2 FRAGMENTS.  - NEGATIVE FOR HIGH-GRADE DYSPLASIA AND MALIGNANCY.     Assessment    Gastroesophageal reflux.  Tubular adenoma of the sigmoid.    Plan     The patient will start on a Prevacid 30 mg each morning. I anticipate her burning discomfort to resolve quickly and the dysphasia she reports with some foods (especially rice and bread) sees to resolve a little more slowly.     Patient to use my  chart [place] in two weeks and six weeks about the burning.  Rx sent to pharmacy. Prevacid   HPI, Physical Exam, Assessment and Plan have been scribed under the direction and in the presence of Hervey Ard, MD.  Gaspar Cola, CMA  I have completed the exam and reviewed the above documentation for accuracy and completeness.  I agree with the above.  Haematologist has been used and any errors in dictation or transcription are unintentional.  Hervey Ard, M.D., F.A.C.S. Robert Bellow 05/31/2017, 8:12 PM

## 2017-06-02 ENCOUNTER — Ambulatory Visit
Admission: RE | Admit: 2017-06-02 | Discharge: 2017-06-02 | Disposition: A | Discharge status: Home / Self Care | Payer: Managed Care, Other (non HMO) | Source: Ambulatory Visit | Attending: Family Medicine | Admitting: Family Medicine

## 2017-06-02 DIAGNOSIS — Z1231 Encounter for screening mammogram for malignant neoplasm of breast: Secondary | ICD-10-CM | POA: Insufficient documentation

## 2017-06-06 ENCOUNTER — Other Ambulatory Visit: Payer: Self-pay | Admitting: Physician Assistant

## 2017-06-06 DIAGNOSIS — R030 Elevated blood-pressure reading, without diagnosis of hypertension: Secondary | ICD-10-CM

## 2017-06-06 NOTE — Telephone Encounter (Signed)
Pharmacy requesting refills.

## 2017-06-13 ENCOUNTER — Encounter: Payer: Self-pay | Admitting: General Surgery

## 2017-06-30 ENCOUNTER — Ambulatory Visit (INDEPENDENT_AMBULATORY_CARE_PROVIDER_SITE_OTHER): Payer: Managed Care, Other (non HMO) | Admitting: Physician Assistant

## 2017-06-30 DIAGNOSIS — Z23 Encounter for immunization: Secondary | ICD-10-CM | POA: Diagnosis not present

## 2017-07-22 ENCOUNTER — Other Ambulatory Visit: Payer: Self-pay | Admitting: Family Medicine

## 2017-07-22 MED ORDER — VALACYCLOVIR HCL 1 G PO TABS: ORAL_TABLET | ORAL | 5 refills | Status: DC

## 2017-07-22 NOTE — Telephone Encounter (Signed)
[place] faxed a refill request for the following medication. Thanks CC  valACYclovir (VALTREX) 1000 MG tablet

## 2017-07-26 ENCOUNTER — Encounter: Payer: Self-pay | Admitting: General Surgery

## 2017-07-28 ENCOUNTER — Encounter: Payer: Self-pay | Admitting: General Surgery

## 2017-07-28 ENCOUNTER — Encounter: Payer: Self-pay | Admitting: *Deleted

## 2017-07-28 ENCOUNTER — Other Ambulatory Visit: Payer: Self-pay | Admitting: General Surgery

## 2017-09-16 ENCOUNTER — Ambulatory Visit (INDEPENDENT_AMBULATORY_CARE_PROVIDER_SITE_OTHER): Payer: Managed Care, Other (non HMO) | Admitting: Physician Assistant

## 2017-09-16 ENCOUNTER — Encounter: Payer: Self-pay | Admitting: Physician Assistant

## 2017-09-16 VITALS — BP 140/80 | HR 70 | Temp 99.0°F | Resp 18 | Wt 258.2 lb

## 2017-09-16 DIAGNOSIS — J40 Bronchitis, not specified as acute or chronic: Secondary | ICD-10-CM | POA: Diagnosis not present

## 2017-09-16 DIAGNOSIS — T3695XA Adverse effect of unspecified systemic antibiotic, initial encounter: Secondary | ICD-10-CM

## 2017-09-16 DIAGNOSIS — B379 Candidiasis, unspecified: Secondary | ICD-10-CM | POA: Diagnosis not present

## 2017-09-16 DIAGNOSIS — R05 Cough: Secondary | ICD-10-CM | POA: Diagnosis not present

## 2017-09-16 DIAGNOSIS — R059 Cough, unspecified: Secondary | ICD-10-CM

## 2017-09-16 DIAGNOSIS — J0140 Acute pansinusitis, unspecified: Secondary | ICD-10-CM | POA: Diagnosis not present

## 2017-09-16 MED ORDER — ALBUTEROL SULFATE HFA 108 (90 BASE) MCG/ACT IN AERS: 2.0000 | INHALATION_SPRAY | Freq: Four times a day (QID) | RESPIRATORY_TRACT | 0 refills | Status: DC | PRN

## 2017-09-16 MED ORDER — AMOXICILLIN-POT CLAVULANATE 875-125 MG PO TABS: 1.0000 | ORAL_TABLET | Freq: Two times a day (BID) | ORAL | 0 refills | Status: DC

## 2017-09-16 MED ORDER — FLUCONAZOLE 150 MG PO TABS: 150.0000 mg | ORAL_TABLET | Freq: Once | ORAL | 0 refills | Status: AC

## 2017-09-16 MED ORDER — FLUTICASONE PROPIONATE 50 MCG/ACT NA SUSP: 2.0000 | NASAL | 0 refills | Status: AC | PRN

## 2017-09-16 MED ORDER — BENZONATATE 100 MG PO CAPS: 100.0000 mg | ORAL_CAPSULE | Freq: Two times a day (BID) | ORAL | 0 refills | Status: DC | PRN

## 2017-09-16 NOTE — Progress Notes (Signed)
Patient: Rita Bailey Female    DOB: 27-Jan-1951   67 y.o.   MRN: [id number] Visit Date: 09/16/2017  Today's Provider: Mar Daring, PA-C   Chief Complaint  Patient presents with  . URI   Subjective:    URI   This is a new problem. The current episode started in the past 7 days. The problem has been gradually worsening. Maximum temperature: temperature 99-100. Associated symptoms include abdominal pain (from coughin a lot), congestion, coughing, headaches, a plugged ear sensation, rhinorrhea, sinus pain, a sore throat (only the first of the week. scrathy) and wheezing. Pertinent negatives include no chest pain or ear pain. Associated symptoms comments: Body ache. Treatments tried: Flonase,Delsym and honey cough drops.      No Known Allergies   Current Outpatient Medications:  .  Cholecalciferol (D 2000) 2000 units TABS, Take 1 tablet by mouth daily., Disp: , Rfl:  .  fluticasone (FLONASE) 50 MCG/ACT nasal spray, Place 2 sprays into both nostrils as needed. , Disp: , Rfl:  .  lansoprazole (PREVACID) 30 MG capsule, TAKE ONE CAPSULE BY MOUTH EVERY DAY AT 12 NOON, Disp: 60 capsule, Rfl: 3 .  Multiple Vitamin (MULTI-VITAMINS) TABS, Take 1 tablet by mouth daily., Disp: , Rfl:  .  triamcinolone cream (KENALOG) 0.1 %, Apply 1 application topically 2 (two) times daily., Disp: 45 g, Rfl: 3 .  triamterene-hydrochlorothiazide (MAXZIDE-25) 37.5-25 MG tablet, TAKE 1 TABLET BY MOUTH DAILY, Disp: 90 tablet, Rfl: 3 .  valACYclovir (VALTREX) 1000 MG tablet, TAKE 2 TABLETS TWICE DAILY AS NEEDED, Disp: 30 tablet, Rfl: 5 .  omeprazole (PRILOSEC) 40 MG capsule, Take 40 mg by mouth daily., Disp: , Rfl:   Review of Systems  Constitutional: Positive for fever (low grade fever).  HENT: Positive for congestion, postnasal drip, rhinorrhea, sinus pressure, sinus pain and sore throat (only the first of the week. scrathy). Negative for ear pain.   Respiratory: Positive for cough and wheezing.  Negative for chest tightness and shortness of breath.   Cardiovascular: Negative for chest pain, palpitations and leg swelling.  Gastrointestinal: Positive for abdominal pain (from coughin a lot).  Neurological: Positive for headaches.    Social History   Tobacco Use  . Smoking status: Never Smoker  . Smokeless tobacco: Never Used  Substance Use Topics  . Alcohol use: Yes    Comment: none last 24hrs   Objective:   BP 140/80 (BP Location: Left Arm, Patient Position: Sitting, Cuff Size: Large)   Pulse 70   Temp 99 F (37.2 C) (Oral)   Resp 18   Wt 258 lb 3.2 oz (117.1 kg)   SpO2 96% Comment: 96-95%  BMI 40.44 kg/m    Physical Exam  Constitutional: She appears well-developed and well-nourished. No distress.  HENT:  Head: Normocephalic and atraumatic.  Right Ear: Hearing, tympanic membrane, external ear and ear canal normal.  Left Ear: Hearing, tympanic membrane, external ear and ear canal normal.  Nose: Right sinus exhibits maxillary sinus tenderness and frontal sinus tenderness. Left sinus exhibits maxillary sinus tenderness and frontal sinus tenderness.  Mouth/Throat: Uvula is midline, oropharynx is clear and moist and mucous membranes are normal. No oropharyngeal exudate.  Neck: Normal range of motion. Neck supple. No tracheal deviation present. No thyromegaly present.  Cardiovascular: Normal rate, regular rhythm and normal heart sounds. Exam reveals no gallop and no friction rub.  No murmur heard. Pulmonary/Chest: Effort normal. No stridor. No respiratory distress. She has wheezes (expiratory wheeze diffusely  throughout). She has no rales.  Lymphadenopathy:    She has no cervical adenopathy.  Skin: She is not diaphoretic.  Vitals reviewed.       Assessment & Plan:     1. Acute pansinusitis, recurrence not specified Worsening symptoms that have not responded to OTC medications. Will give augmentin as below. Continue allergy medications. Stay well hydrated and get  plenty of rest. Call if no symptom improvement or if symptoms worsen. - amoxicillin-clavulanate (AUGMENTIN) 875-125 MG tablet; Take 1 tablet by mouth 2 (two) times daily.  Dispense: 20 tablet; Refill: 0 - fluticasone (FLONASE) 50 MCG/ACT nasal spray; Place 2 sprays into both nostrils as needed.  Dispense: 16 g; Refill: 0  2. Bronchitis Add albuterol inhaler for wheezing/chest tightness.  - amoxicillin-clavulanate (AUGMENTIN) 875-125 MG tablet; Take 1 tablet by mouth 2 (two) times daily.  Dispense: 20 tablet; Refill: 0 - albuterol (PROVENTIL HFA;VENTOLIN HFA) 108 (90 Base) MCG/ACT inhaler; Inhale 2 puffs into the lungs every 6 (six) hours as needed for wheezing or shortness of breath.  Dispense: 1 Inhaler; Refill: 0  3. Antibiotic-induced yeast infection Given as prophylaxis since patient has tendency to get yeast infections with antibiotic use.  - fluconazole (DIFLUCAN) 150 MG tablet; Take 1 tablet (150 mg total) by mouth once for 1 dose. May take 2nd tab PO 48-72 hrs after first if needed  Dispense: 2 tablet; Refill: 0  4. Cough Tessalon perles for cough prn. Push fluids. Call if cough worsens or SOB worsens.  - benzonatate (TESSALON) 100 MG capsule; Take 1 capsule (100 mg total) by mouth 2 (two) times daily as needed for cough.  Dispense: 20 capsule; Refill: 0       Mar Daring, PA-C  [facility]

## 2017-09-16 NOTE — Patient Instructions (Signed)

## 2017-10-05 ENCOUNTER — Ambulatory Visit (INDEPENDENT_AMBULATORY_CARE_PROVIDER_SITE_OTHER): Payer: Managed Care, Other (non HMO) | Admitting: Family Medicine

## 2017-10-05 ENCOUNTER — Encounter: Payer: Self-pay | Admitting: Family Medicine

## 2017-10-05 VITALS — BP 126/80 | HR 67 | Temp 98.6°F | Resp 16 | Wt 258.4 lb

## 2017-10-05 DIAGNOSIS — Z23 Encounter for immunization: Secondary | ICD-10-CM

## 2017-10-05 DIAGNOSIS — I10 Essential (primary) hypertension: Secondary | ICD-10-CM | POA: Diagnosis not present

## 2017-10-05 DIAGNOSIS — N950 Postmenopausal bleeding: Secondary | ICD-10-CM | POA: Insufficient documentation

## 2017-10-05 NOTE — Progress Notes (Signed)
Patient: Rita Bailey Female    DOB: [birth year]   67 y.o.   MRN: [id number] Visit Date: 10/05/2017  Today's Provider: Lavon Paganini, MD   Chief Complaint  Patient presents with  . Hypertension    Patient returns to office todday for her 6 month follow up   Subjective:    HPI      Hypertension, follow-up:  BP Readings from Last 3 Encounters:  10/05/17 126/80  09/16/17 140/80  05/30/17 130/78    She was last seen for hypertension 6 months ago.  BP at that visit was 128/80. Management changes since that visit include none. She reports excellent compliance with treatment. She is not having side effects.  She is exercising. She is adherent to low salt diet.   Outside blood pressures are not being checked. She is experiencing none.  Patient denies chest pain, chest pressure/discomfort, claudication, dyspnea, exertional chest pressure/discomfort, fatigue, irregular heart beat, lower extremity edema, near-syncope, orthopnea, palpitations, paroxysmal nocturnal dyspnea, syncope and tachypnea.   Cardiovascular risk factors include advanced age (older than 68 for men, 58 for women), hypertension and obesity (BMI >= 30 kg/m2).  Use of agents associated with hypertension: none.     Weight trend: stable Wt Readings from Last 3 Encounters:  10/05/17 258 lb 6.4 oz (117.2 kg)  09/16/17 258 lb 3.2 oz (117.1 kg)  05/30/17 257 lb (116.6 kg)    Current diet: well balanced  ------------------------------------------------------------------------   No Known Allergies   Current Outpatient Medications:  .  Cholecalciferol (D 2000) 2000 units TABS, Take 1 tablet by mouth daily., Disp: , Rfl:  .  lansoprazole (PREVACID) 30 MG capsule, TAKE ONE CAPSULE BY MOUTH EVERY DAY AT 12 NOON, Disp: 60 capsule, Rfl: 3 .  Multiple Vitamin (MULTI-VITAMINS) TABS, Take 1 tablet by mouth daily., Disp: , Rfl:  .  omeprazole (PRILOSEC) 40 MG capsule, Take 40 mg by mouth daily., Disp: ,  Rfl:  .  triamcinolone cream (KENALOG) 0.1 %, Apply 1 application topically 2 (two) times daily., Disp: 45 g, Rfl: 3 .  triamterene-hydrochlorothiazide (MAXZIDE-25) 37.5-25 MG tablet, TAKE 1 TABLET BY MOUTH DAILY, Disp: 90 tablet, Rfl: 3 .  valACYclovir (VALTREX) 1000 MG tablet, TAKE 2 TABLETS TWICE DAILY AS NEEDED, Disp: 30 tablet, Rfl: 5 .  albuterol (PROVENTIL HFA;VENTOLIN HFA) 108 (90 Base) MCG/ACT inhaler, Inhale 2 puffs into the lungs every 6 (six) hours as needed for wheezing or shortness of breath. (Patient not taking: Reported on 10/05/2017), Disp: 1 Inhaler, Rfl: 0 .  fluticasone (FLONASE) 50 MCG/ACT nasal spray, Place 2 sprays into both nostrils as needed. (Patient not taking: Reported on 10/05/2017), Disp: 16 g, Rfl: 0  Review of Systems  Constitutional: Negative.   HENT: Negative.   Respiratory: Negative.   Cardiovascular: Negative.   Genitourinary: Negative.   Neurological: Negative.     Social History   Tobacco Use  . Smoking status: Never Smoker  . Smokeless tobacco: Never Used  Substance Use Topics  . Alcohol use: Yes    Comment: none last 24hrs   Objective:   BP 126/80   Pulse 67   Temp 98.6 F (37 C) (Oral)   Resp 16   Wt 258 lb 6.4 oz (117.2 kg)   SpO2 97%   BMI 40.47 kg/m  Vitals:   10/05/17 0834  BP: 126/80  Pulse: 67  Resp: 16  Temp: 98.6 F (37 C)  TempSrc: Oral  SpO2: 97%  Weight: 258 lb 6.4 oz (  117.2 kg)     Physical Exam  Constitutional: She is oriented to person, place, and time. She appears well-developed and well-nourished. No distress.  HENT:  Head: Normocephalic and atraumatic.  Eyes: Conjunctivae are normal. No scleral icterus.  Cardiovascular: Normal rate, regular rhythm, normal heart sounds and intact distal pulses.  No murmur heard. Pulmonary/Chest: Effort normal and breath sounds normal. No respiratory distress. She has no wheezes. She has no rales.  Musculoskeletal: She exhibits no edema or deformity.  Neurological: She is  alert and oriented to person, place, and time.  Psychiatric: She has a normal mood and affect. Her behavior is normal.  Vitals reviewed.      Assessment & Plan:     Problem List Items Addressed This Visit      Cardiovascular and Mediastinum   Hypertension - Primary    Well controlled Continue current Rx Check BMP F/u in 6 months      Relevant Orders   Basic Metabolic Panel (BMET)    Other Visit Diagnoses    Need for shingles vaccine        - as patient was recently treated with antibiotics within the last week, we will have her return next week for 2nd Shingrix vaccine   Return in about 6 months (around 04/04/2018) for Physical.   The entirety of the information documented in the History of Present Illness, Review of Systems and Physical Exam were personally obtained by me. Portions of this information were initially documented by Jennings Books, CMA and reviewed by me for thoroughness and accuracy.    Virginia Crews, MD, MPH [facility] 10/05/2017 9:26 AM

## 2017-10-05 NOTE — Assessment & Plan Note (Signed)
Well controlled Continue current Rx Check BMP F/u in 6 months

## 2017-10-06 ENCOUNTER — Telehealth: Payer: Self-pay

## 2017-10-06 LAB — BASIC METABOLIC PANEL
BUN / CREAT RATIO: 20 (ref 12–28)
BUN: 20 mg/dL (ref 8–27)
CO2: 24 mmol/L (ref 20–29)
CREATININE: 1.01 mg/dL — AB (ref 0.57–1.00)
Calcium: 9.2 mg/dL (ref 8.7–10.3)
Chloride: 104 mmol/L (ref 96–106)
GFR, EST AFRICAN AMERICAN: 68 mL/min/{1.73_m2} (ref >59)
GFR, EST NON AFRICAN AMERICAN: 59 mL/min/{1.73_m2} — AB (ref >59)
Glucose: 102 mg/dL — ABNORMAL HIGH (ref 65–99)
Potassium: 3.8 mmol/L (ref 3.5–5.2)
SODIUM: 142 mmol/L (ref 134–144)

## 2017-10-06 NOTE — Telephone Encounter (Signed)
Pt advised and agrees with plan. 

## 2017-10-06 NOTE — Telephone Encounter (Signed)
-----   Message from Virginia Crews, MD sent at 10/06/2017  8:25 AM EST ----- Kidney function has decreased very slightly.  Stay well hydrated and we will recheck in 6 months. OK to continue current meds.  Virginia Crews, MD, MPH [facility] 10/06/2017 8:25 AM

## 2017-10-12 ENCOUNTER — Other Ambulatory Visit: Payer: Self-pay

## 2017-10-12 ENCOUNTER — Ambulatory Visit (INDEPENDENT_AMBULATORY_CARE_PROVIDER_SITE_OTHER): Payer: Managed Care, Other (non HMO) | Admitting: Family Medicine

## 2017-10-12 ENCOUNTER — Ambulatory Visit: Payer: Self-pay | Admitting: Family Medicine

## 2017-10-12 DIAGNOSIS — Z23 Encounter for immunization: Secondary | ICD-10-CM | POA: Diagnosis not present

## 2017-10-12 MED ORDER — AMMONIUM LACTATE 12 % EX LOTN: 1.0000 "application " | TOPICAL_LOTION | CUTANEOUS | 2 refills | Status: DC | PRN

## 2017-10-12 NOTE — Telephone Encounter (Signed)
Patient is requesting prescription for Ammonium Lactate 12% Topical Cream. Patient reports this has helped with her dry skin. Patient reports cream was prescribed to her husband by dermatologist.

## 2017-10-12 NOTE — Progress Notes (Signed)
Patient here today for second Shingrix vaccine. Patient reports feel well. Patient denies any fever, cough or runny nose. Patient reports she did well with first vaccine.   Reviewed history and vaccination questionnaire.  Did not see or examine patient.  Virginia Crews, MD, MPH [facility] 10/12/2017 9:48 AM

## 2017-12-07 ENCOUNTER — Other Ambulatory Visit: Payer: Self-pay | Admitting: Family Medicine

## 2017-12-07 MED ORDER — VALACYCLOVIR HCL 1 G PO TABS: ORAL_TABLET | ORAL | 5 refills | Status: DC

## 2017-12-07 NOTE — Telephone Encounter (Signed)
[place] [facility] faxed a refill request for the following medication. Thanks CC  valACYclovir (VALTREX) 1000 MG tablet

## 2017-12-16 ENCOUNTER — Other Ambulatory Visit: Payer: Self-pay | Admitting: Family Medicine

## 2017-12-16 NOTE — Telephone Encounter (Signed)
Pharmacy requesting refills. Thanks!  

## 2017-12-19 ENCOUNTER — Other Ambulatory Visit: Payer: Self-pay | Admitting: Family Medicine

## 2017-12-19 MED ORDER — VALACYCLOVIR HCL 1 G PO TABS: ORAL_TABLET | ORAL | 5 refills | Status: DC

## 2017-12-19 NOTE — Telephone Encounter (Signed)
Please resend into [facility]. Thanks!

## 2017-12-19 NOTE — Telephone Encounter (Signed)
[facility] faxed a refill request for the following medication. Thanks CC  valACYclovir (VALTREX) 1000 MG tablet

## 2018-04-13 ENCOUNTER — Ambulatory Visit (INDEPENDENT_AMBULATORY_CARE_PROVIDER_SITE_OTHER): Payer: Managed Care, Other (non HMO) | Admitting: Family Medicine

## 2018-04-13 ENCOUNTER — Encounter: Payer: Self-pay | Admitting: Family Medicine

## 2018-04-13 VITALS — BP 128/86 | HR 75 | Temp 98.6°F | Ht 67.0 in | Wt 257.4 lb

## 2018-04-13 DIAGNOSIS — Z114 Encounter for screening for human immunodeficiency virus [HIV]: Secondary | ICD-10-CM

## 2018-04-13 DIAGNOSIS — I10 Essential (primary) hypertension: Secondary | ICD-10-CM

## 2018-04-13 DIAGNOSIS — R739 Hyperglycemia, unspecified: Secondary | ICD-10-CM

## 2018-04-13 DIAGNOSIS — Z0000 Encounter for general adult medical examination without abnormal findings: Secondary | ICD-10-CM | POA: Diagnosis not present

## 2018-04-13 DIAGNOSIS — Z1239 Encounter for other screening for malignant neoplasm of breast: Secondary | ICD-10-CM

## 2018-04-13 DIAGNOSIS — K580 Irritable bowel syndrome with diarrhea: Secondary | ICD-10-CM | POA: Diagnosis not present

## 2018-04-13 DIAGNOSIS — Z1231 Encounter for screening mammogram for malignant neoplasm of breast: Secondary | ICD-10-CM | POA: Diagnosis not present

## 2018-04-13 DIAGNOSIS — L309 Dermatitis, unspecified: Secondary | ICD-10-CM | POA: Diagnosis not present

## 2018-04-13 DIAGNOSIS — Z6841 Body Mass Index (BMI) 40.0 and over, adult: Secondary | ICD-10-CM

## 2018-04-13 DIAGNOSIS — R7303 Prediabetes: Secondary | ICD-10-CM | POA: Insufficient documentation

## 2018-04-13 MED ORDER — RIFAXIMIN 550 MG PO TABS: 550.0000 mg | ORAL_TABLET | Freq: Three times a day (TID) | ORAL | 0 refills | Status: AC

## 2018-04-13 MED ORDER — TRIAMCINOLONE ACETONIDE 0.5 % EX OINT: 1.0000 "application " | TOPICAL_OINTMENT | Freq: Two times a day (BID) | CUTANEOUS | 2 refills | Status: DC

## 2018-04-13 NOTE — Assessment & Plan Note (Signed)
Discussed diet and exercise Screening lab work

## 2018-04-13 NOTE — Progress Notes (Signed)
Patient: Rita Bailey, Female    DOB: 1950/11/20, 67 y.o.   MRN: [id number] Visit Date: 04/13/2018  Today's Provider: Lavon Paganini, MD   Chief Complaint  Patient presents with  . Annual Exam   Subjective:  I, Tiburcio Pea, CMA, am acting as a scribe for Lavon Paganini, MD.    Annual physical exam Rita Bailey is a 67 y.o. female who presents today for health maintenance and complete physical. She feels well. She reports exercising none. She reports she is sleeping fair (due to stress at work and her family).  Patient is also concerned about a rash on her lower legs.  She has been using triamcinolone cream for this twice daily.  She states that it has previously helped and she has not been using it a little while.  She wonders if she needs to go back to the dermatologist that prescribed it.  The skin is dry and itchy.  There is no drainage, pain, bleeding.  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  She states after she eats she almost always has to get to a bathroom quickly.  This affects her life as it is difficult to eat outside of the house.  She has never taken a medication for this.  She recently had a colonoscopy which was reviewed. -----------------------------------------------------------------   Review of Systems  Constitutional: Negative.   HENT: Negative.   Eyes: Negative.   Respiratory: Negative.   Cardiovascular: Negative.   Gastrointestinal: Negative.   Endocrine: Negative.   Genitourinary: Negative.   Musculoskeletal: Positive for gait problem.  Skin: Negative.  Wound: leg wounds.  Allergic/Immunologic: Negative.   Hematological: Negative.   Psychiatric/Behavioral: Negative.     Social History      She  reports that she has never smoked. She has never used smokeless tobacco. She reports that she drinks alcohol. She reports that she does not use drugs.       Social History   Socioeconomic History  .  Marital status: Married    Spouse name: Dorothyann Peng  . Number of children: 1  . Years of education: Bachelors  . Highest education level: Not on file  Occupational History    Employer: [employer]  Social Needs  . Financial resource strain: Not on file  . Food insecurity:    Worry: Not on file    Inability: Not on file  . Transportation needs:    Medical: Not on file    Non-medical: Not on file  Tobacco Use  . Smoking status: Never Smoker  . Smokeless tobacco: Never Used  Substance and Sexual Activity  . Alcohol use: Yes    Comment: none last 24hrs  . Drug use: No  . Sexual activity: Yes  Lifestyle  . Physical activity:    Days per week: Not on file    Minutes per session: Not on file  . Stress: Not on file  Relationships  . Social connections:    Talks on phone: Not on file    Gets together: Not on file    Attends religious service: Not on file    Active member of club or organization: Not on file    Attends meetings of clubs or organizations: Not on file    Relationship status: Not on file  Other Topics Concern  . Not on file  Social History Narrative  . Not on file    Past Medical History:  Diagnosis Date  . Allergy   .  Arthritis   . Hypertension      Patient Active Problem List   Diagnosis Date Noted  . PMB (postmenopausal bleeding) 10/05/2017  . Gastro-esophageal reflux disease with esophagitis 05/13/2017  . Strain of muscle of right hip 05/06/2017  . Bilateral hearing loss 04/12/2017  . Healthcare maintenance 04/12/2017  . Scoliosis of lumbosacral spine 03/30/2017  . Abnormality of gait and mobility 03/30/2017  . Allergic rhinitis 09/08/2015  . Hypertension 09/08/2015  . H/O infectious disease 09/08/2015  . Gonalgia 09/08/2015  . RAD (reactive airway disease) 09/08/2015  . Obesity 03/12/2015  . Current tear of meniscus 01/02/2015  . Tear of meniscus of knee 01/02/2015    Past Surgical History:  Procedure Laterality Date  . CESAREAN SECTION     . COLONOSCOPY WITH PROPOFOL N/A 05/11/2017   Procedure: COLONOSCOPY WITH PROPOFOL;  Surgeon: Robert Bellow, MD;  Location: [facility];  Service: Endoscopy;  Laterality: N/A;  . DILATATION & CURETTAGE/HYSTEROSCOPY WITH MYOSURE N/A 02/27/2016   Procedure: DILATATION & CURETTAGE/HYSTEROSCOPY WITH MYOSURE;  Surgeon: Boykin Nearing, MD;  Location: [facility];  Service: Gynecology;  Laterality: N/A;  . ESOPHAGOGASTRODUODENOSCOPY (EGD) WITH PROPOFOL N/A 05/11/2017   Procedure: ESOPHAGOGASTRODUODENOSCOPY (EGD) WITH PROPOFOL;  Surgeon: Robert Bellow, MD;  Location: [facility];  Service: Endoscopy;  Laterality: N/A;  . sebaceous syst removed  08/24/2011  . tonsillectomy    . TONSILLECTOMY      Family History        Family Status  Relation Name Status  . Mother  Alive  . Father  Alive       irregular pulse  . Son  Alive  . MGM  Deceased       asthma  . MGF  Deceased       lung cancer  . PGM  Deceased       natural causes  . PGF  Deceased       MI, alcoholic  . Neg Hx  (Not Specified)        Her family history includes Cerebral palsy in her son; Diabetes in her father and mother; Hypertension in her mother. There is no history of Breast cancer.      No Known Allergies   Current Outpatient Medications:  .  albuterol (PROVENTIL HFA;VENTOLIN HFA) 108 (90 Base) MCG/ACT inhaler, Inhale 2 puffs into the lungs every 6 (six) hours as needed for wheezing or shortness of breath., Disp: 1 Inhaler, Rfl: 0 .  ammonium lactate (AMLACTIN) 12 % lotion, Apply 1 application topically as needed for dry skin., Disp: 400 g, Rfl: 2 .  Cholecalciferol (D 2000) 2000 units TABS, Take 1 tablet by mouth daily., Disp: , Rfl:  .  fluticasone (FLONASE) 50 MCG/ACT nasal spray, Place 2 sprays into both nostrils as needed., Disp: 16 g, Rfl: 0 .  lansoprazole (PREVACID) 30 MG capsule, TAKE ONE CAPSULE BY MOUTH EVERY DAY AT 12 NOON, Disp: 60 capsule, Rfl: 3 .  Multiple Vitamin (MULTI-VITAMINS) TABS,  Take 1 tablet by mouth daily., Disp: , Rfl:  .  triamcinolone cream (KENALOG) 0.1 %, APPLY TWICE DAILY, Disp: 45 g, Rfl: 3 .  triamterene-hydrochlorothiazide (MAXZIDE-25) 37.5-25 MG tablet, TAKE 1 TABLET BY MOUTH DAILY, Disp: 90 tablet, Rfl: 3 .  valACYclovir (VALTREX) 1000 MG tablet, TAKE 2 TABLETS TWICE DAILY AS NEEDED, Disp: 30 tablet, Rfl: 5   Patient Care Team: Virginia Crews, MD as PCP - General (Family Medicine)      Objective:   Vitals: BP 128/86 (BP Location:  Right Arm, Patient Position: Sitting, Cuff Size: Normal)   Pulse 75   Temp 98.6 F (37 C) (Oral)   Ht 5\' 7"  (1.702 m)   Wt 257 lb 6.4 oz (116.8 kg)   SpO2 97%   BMI 40.31 kg/m    Vitals:   04/13/18 0911  BP: 128/86  Pulse: 75  Temp: 98.6 F (37 C)  TempSrc: Oral  SpO2: 97%  Weight: 257 lb 6.4 oz (116.8 kg)  Height: 5\' 7"  (1.702 m)     Physical Exam  Constitutional: She is oriented to person, place, and time. She appears well-developed and well-nourished. No distress.  HENT:  Head: Normocephalic and atraumatic.  Right Ear: External ear normal.  Left Ear: External ear normal.  Nose: Nose normal.  Mouth/Throat: Oropharynx is clear and moist.  Eyes: Pupils are equal, round, and reactive to light. Conjunctivae and EOM are normal. No scleral icterus.  Neck: Neck supple. No thyromegaly present.  Cardiovascular: Normal rate, regular rhythm, normal heart sounds and intact distal pulses.  No murmur heard. Pulmonary/Chest: Effort normal and breath sounds normal. No respiratory distress. She has no wheezes. She has no rales.  Abdominal: Soft. Bowel sounds are normal. She exhibits no distension. There is no tenderness. There is no rebound and no guarding.  Musculoskeletal: She exhibits no edema or deformity.  Lymphadenopathy:    She has no cervical adenopathy.  Neurological: She is alert and oriented to person, place, and time.  Skin: Skin is warm and dry. Capillary refill takes less than 2 seconds. Rash  (erythematous maculopapular rash of b/l shins with excoriations) noted.  Psychiatric: She has a normal mood and affect. Her behavior is normal.  Vitals reviewed.    Depression Screen PHQ 2/9 Scores 04/13/2018 04/12/2017 09/15/2015  PHQ - 2 Score 0 0 -  PHQ- 9 Score 0 - -  Exception Documentation - - Patient refusal      Assessment & Plan:     Routine Health Maintenance and Physical Exam  Exercise Activities and Dietary recommendations Goals   None     Immunization History  Administered Date(s) Administered  . Influenza,inj,Quad PF,6+ Mos 06/30/2017  . Influenza-Unspecified 05/17/2015  . Tdap 04/29/2009  . Zoster Recombinat (Shingrix) 04/12/2017, 10/12/2017    Health Maintenance  Topic Date Due  . HIV Screening  07/06/1968  . INFLUENZA VACCINE  11/14/2018 (Originally 03/16/2018)  . PAP SMEAR  01/09/2019  . TETANUS/TDAP  04/30/2019  . MAMMOGRAM  06/03/2019  . COLONOSCOPY  05/11/2022  . Hepatitis C Screening  Completed     Discussed health benefits of physical activity, and encouraged her to engage in regular exercise appropriate for her age and condition.    --------------------------------------------------------------------  Problem List Items Addressed This Visit      Cardiovascular and Mediastinum   Hypertension    Well-controlled Continue current prescription Check CMP Follow-up in 6 months      Relevant Orders   Comprehensive metabolic panel     Digestive   Irritable bowel syndrome with diarrhea    Chronic problem, but new to me Patient is never tried any medications We discussed options and we will try Xifaxan for 2 weeks to see if this improves it        Musculoskeletal and Integument   Eczema    Chronic problem Discussed continued use of steroid ointment We will change triamcinolone cream to triamcinolone ointment to raise potency Continue to keep area well moisturized Discussed allergen avoidance, avoiding hot water or other skin drying  activities        Other   Obesity    Discussed diet and exercise Screening lab work      Relevant Orders   CBC   Comprehensive metabolic panel   Lipid panel   Hemoglobin A1c   Hyperglycemia    Noted on last fasting blood work Check hemoglobin A1c Discussed low-carb diet      Relevant Orders   Hemoglobin A1c    Other Visit Diagnoses    Encounter for annual physical exam    -  Primary   Relevant Orders   CBC   Comprehensive metabolic panel   Lipid panel   Hemoglobin A1c   Screening for breast cancer       Relevant Orders   MM 3D SCREEN BREAST BILATERAL   Screening for HIV (human immunodeficiency virus)       Relevant Orders   HIV antibody       Return in about 6 months (around 10/14/2018) for chronic disease f/u and CPE in 1 year.   The entirety of the information documented in the History of Present Illness, Review of Systems and Physical Exam were personally obtained by me. Portions of this information were initially documented by Tiburcio Pea, CMA and reviewed by me for thoroughness and accuracy.    Virginia Crews, MD, MPH [facility] 04/13/2018 11:05 AM

## 2018-04-13 NOTE — Patient Instructions (Signed)
Preventive Care 40-64 Years, Female Preventive care refers to lifestyle choices and visits with your health care provider that can promote health and wellness. What does preventive care include?  A yearly physical exam. This is also called an annual well check.  Dental exams once or twice a year.  Routine eye exams. Ask your health care provider how often you should have your eyes checked.  Personal lifestyle choices, including: ? Daily care of your teeth and gums. ? Regular physical activity. ? Eating a healthy diet. ? Avoiding tobacco and drug use. ? Limiting alcohol use. ? Practicing safe sex. ? Taking low-dose aspirin daily starting at age 58. ? Taking vitamin and mineral supplements as recommended by your health care provider. What happens during an annual well check? The services and screenings done by your health care provider during your annual well check will depend on your age, overall health, lifestyle risk factors, and family history of disease. Counseling Your health care provider may ask you questions about your:  Alcohol use.  Tobacco use.  Drug use.  Emotional well-being.  Home and relationship well-being.  Sexual activity.  Eating habits.  Work and work Statistician.  Method of birth control.  Menstrual cycle.  Pregnancy history.  Screening You may have the following tests or measurements:  Height, weight, and BMI.  Blood pressure.  Lipid and cholesterol levels. These may be checked every 5 years, or more frequently if you are over 81 years old.  Skin check.  Lung cancer screening. You may have this screening every year starting at age 78 if you have a 30-pack-year history of smoking and currently smoke or have quit within the past 15 years.  Fecal occult blood test (FOBT) of the stool. You may have this test every year starting at age 65.  Flexible sigmoidoscopy or colonoscopy. You may have a sigmoidoscopy every 5 years or a colonoscopy  every 10 years starting at age 30.  Hepatitis C blood test.  Hepatitis B blood test.  Sexually transmitted disease (STD) testing.  Diabetes screening. This is done by checking your blood sugar (glucose) after you have not eaten for a while (fasting). You may have this done every 1-3 years.  Mammogram. This may be done every 1-2 years. Talk to your health care provider about when you should start having regular mammograms. This may depend on whether you have a family history of breast cancer.  BRCA-related cancer screening. This may be done if you have a family history of breast, ovarian, tubal, or peritoneal cancers.  Pelvic exam and Pap test. This may be done every 3 years starting at age 80. Starting at age 36, this may be done every 5 years if you have a Pap test in combination with an HPV test.  Bone density scan. This is done to screen for osteoporosis. You may have this scan if you are at high risk for osteoporosis.  Discuss your test results, treatment options, and if necessary, the need for more tests with your health care provider. Vaccines Your health care provider may recommend certain vaccines, such as:  Influenza vaccine. This is recommended every year.  Tetanus, diphtheria, and acellular pertussis (Tdap, Td) vaccine. You may need a Td booster every 10 years.  Varicella vaccine. You may need this if you have not been vaccinated.  Zoster vaccine. You may need this after age 5.  Measles, mumps, and rubella (MMR) vaccine. You may need at least one dose of MMR if you were born in  1957 or later. You may also need a second dose.  Pneumococcal 13-valent conjugate (PCV13) vaccine. You may need this if you have certain conditions and were not previously vaccinated.  Pneumococcal polysaccharide (PPSV23) vaccine. You may need one or two doses if you smoke cigarettes or if you have certain conditions.  Meningococcal vaccine. You may need this if you have certain  conditions.  Hepatitis A vaccine. You may need this if you have certain conditions or if you travel or work in places where you may be exposed to hepatitis A.  Hepatitis B vaccine. You may need this if you have certain conditions or if you travel or work in places where you may be exposed to hepatitis B.  Haemophilus influenzae type b (Hib) vaccine. You may need this if you have certain conditions.  Talk to your health care provider about which screenings and vaccines you need and how often you need them. This information is not intended to replace advice given to you by your health care provider. Make sure you discuss any questions you have with your health care provider. Document Released: 08/29/2015 Document Revised: 04/21/2016 Document Reviewed: 06/03/2015 Elsevier Interactive Patient Education  2018 Elsevier Inc.  

## 2018-04-13 NOTE — Assessment & Plan Note (Signed)
Noted on last fasting blood work Check hemoglobin A1c Discussed low-carb diet

## 2018-04-13 NOTE — Assessment & Plan Note (Signed)
Chronic problem Discussed continued use of steroid ointment We will change triamcinolone cream to triamcinolone ointment to raise potency Continue to keep area well moisturized Discussed allergen avoidance, avoiding hot water or other skin drying activities

## 2018-04-13 NOTE — Assessment & Plan Note (Signed)
Well-controlled Continue current prescription Check CMP Follow-up in 6 months

## 2018-04-13 NOTE — Assessment & Plan Note (Signed)
Chronic problem, but new to me Patient is never tried any medications We discussed options and we will try Xifaxan for 2 weeks to see if this improves it

## 2018-04-14 ENCOUNTER — Telehealth: Payer: Self-pay

## 2018-04-14 LAB — LIPID PANEL
CHOLESTEROL TOTAL: 176 mg/dL (ref 100–199)
Chol/HDL Ratio: 2.7 ratio (ref 0.0–4.4)
HDL: 66 mg/dL (ref >39)
LDL Calculated: 90 mg/dL (ref 0–99)
Triglycerides: 101 mg/dL (ref 0–149)
VLDL CHOLESTEROL CAL: 20 mg/dL (ref 5–40)

## 2018-04-14 LAB — HEMOGLOBIN A1C
Est. average glucose Bld gHb Est-mCnc: 126 mg/dL
Hgb A1c MFr Bld: 6 % — ABNORMAL HIGH (ref 4.8–5.6)

## 2018-04-14 LAB — COMPREHENSIVE METABOLIC PANEL
A/G RATIO: 1.5 (ref 1.2–2.2)
ALBUMIN: 4.1 g/dL (ref 3.6–4.8)
ALK PHOS: 66 IU/L (ref 39–117)
ALT: 15 IU/L (ref 0–32)
AST: 20 IU/L (ref 0–40)
BILIRUBIN TOTAL: 0.6 mg/dL (ref 0.0–1.2)
BUN / CREAT RATIO: 19 (ref 12–28)
BUN: 19 mg/dL (ref 8–27)
CHLORIDE: 105 mmol/L (ref 96–106)
CO2: 23 mmol/L (ref 20–29)
Calcium: 9.3 mg/dL (ref 8.7–10.3)
Creatinine, Ser: 0.99 mg/dL (ref 0.57–1.00)
GFR calc non Af Amer: 60 mL/min/{1.73_m2} (ref >59)
GFR, EST AFRICAN AMERICAN: 70 mL/min/{1.73_m2} (ref >59)
GLOBULIN, TOTAL: 2.7 g/dL (ref 1.5–4.5)
GLUCOSE: 103 mg/dL — AB (ref 65–99)
POTASSIUM: 3.8 mmol/L (ref 3.5–5.2)
SODIUM: 144 mmol/L (ref 134–144)
TOTAL PROTEIN: 6.8 g/dL (ref 6.0–8.5)

## 2018-04-14 LAB — CBC
Hematocrit: 43.4 % (ref 34.0–46.6)
Hemoglobin: 14.7 g/dL (ref 11.1–15.9)
MCH: 30.8 pg (ref 26.6–33.0)
MCHC: 33.9 g/dL (ref 31.5–35.7)
MCV: 91 fL (ref 79–97)
Platelets: 206 x10E3/uL (ref 150–450)
RBC: 4.78 x10E6/uL (ref 3.77–5.28)
RDW: 13.2 % (ref 12.3–15.4)
WBC: 6.2 x10E3/uL (ref 3.4–10.8)

## 2018-04-14 LAB — HIV ANTIBODY (ROUTINE TESTING W REFLEX): HIV Screen 4th Generation wRfx: NONREACTIVE

## 2018-04-14 NOTE — Telephone Encounter (Signed)
Pt returned call. Pt stated she is traveling and request call on cell #. Pt stated if she isn't able to answer please leave the results on her voicemail. Please advise. Thanks TNP

## 2018-04-14 NOTE — Telephone Encounter (Signed)
-----   Message from Virginia Crews, MD sent at 04/14/2018  9:18 AM EDT ----- Labs look good. Normal cholesterol, Blood counts, kidney function, liver function, electrolytes.  Hemoglobin A1c - 3 month avg of blood sugars - is elevated into the pre-diabetes range.  Recommend low carb diet and regular exercise - at least 30 min/day, 5 times weekly.  We will recheck at 6 month f/u.  Virginia Crews, MD, MPH [facility] 04/14/2018 9:18 AM

## 2018-04-14 NOTE — Telephone Encounter (Signed)
LMTCB

## 2018-04-14 NOTE — Telephone Encounter (Signed)
Patient advised.

## 2018-06-08 ENCOUNTER — Other Ambulatory Visit: Payer: Self-pay | Admitting: Family Medicine

## 2018-06-08 DIAGNOSIS — R030 Elevated blood-pressure reading, without diagnosis of hypertension: Secondary | ICD-10-CM

## 2018-06-08 NOTE — Telephone Encounter (Signed)
Pt needing refills on:  triamterene-hydrochlorothiazide (MAXZIDE-25) 37.5-25 MG tablet - pt is out of Rx   Please fill at: [place], [place] [phone] (Phone) [phone] (Fax    Thanks, [place]

## 2018-06-08 NOTE — Telephone Encounter (Signed)
Already refilled  Brita Romp Dionne Bucy, MD, MPH [facility] 06/08/2018 4:17 PM

## 2018-06-21 ENCOUNTER — Ambulatory Visit
Admission: RE | Admit: 2018-06-21 | Discharge: 2018-06-21 | Disposition: A | Discharge status: Home / Self Care | Payer: Managed Care, Other (non HMO) | Source: Ambulatory Visit | Attending: Family Medicine | Admitting: Family Medicine

## 2018-06-21 DIAGNOSIS — Z1239 Encounter for other screening for malignant neoplasm of breast: Secondary | ICD-10-CM | POA: Insufficient documentation

## 2018-08-03 ENCOUNTER — Encounter: Payer: Self-pay | Admitting: *Deleted

## 2018-08-03 ENCOUNTER — Other Ambulatory Visit: Payer: Self-pay | Admitting: General Surgery

## 2018-09-19 ENCOUNTER — Ambulatory Visit
Admission: RE | Admit: 2018-09-19 | Discharge: 2018-09-19 | Disposition: A | Discharge status: Home / Self Care | Payer: Managed Care, Other (non HMO) | Source: Ambulatory Visit | Attending: Physician Assistant | Admitting: Physician Assistant

## 2018-09-19 ENCOUNTER — Ambulatory Visit
Admission: RE | Admit: 2018-09-19 | Discharge: 2018-09-19 | Disposition: A | Discharge status: Home / Self Care | Payer: Managed Care, Other (non HMO) | Attending: Physician Assistant | Admitting: Physician Assistant

## 2018-09-19 ENCOUNTER — Encounter: Payer: Self-pay | Admitting: Physician Assistant

## 2018-09-19 ENCOUNTER — Ambulatory Visit (INDEPENDENT_AMBULATORY_CARE_PROVIDER_SITE_OTHER): Payer: Managed Care, Other (non HMO) | Admitting: Physician Assistant

## 2018-09-19 VITALS — BP 160/83 | HR 77 | Temp 99.1°F | Resp 16 | Wt 261.0 lb

## 2018-09-19 DIAGNOSIS — R0981 Nasal congestion: Secondary | ICD-10-CM | POA: Diagnosis not present

## 2018-09-19 DIAGNOSIS — R509 Fever, unspecified: Secondary | ICD-10-CM | POA: Insufficient documentation

## 2018-09-19 DIAGNOSIS — R0989 Other specified symptoms and signs involving the circulatory and respiratory systems: Secondary | ICD-10-CM

## 2018-09-19 LAB — POCT INFLUENZA A/B
Influenza A, POC: NEGATIVE
Influenza B, POC: NEGATIVE

## 2018-09-19 NOTE — Progress Notes (Signed)
Patient: Rita Bailey Female    DOB: [birth year]   68 y.o.   MRN: [id number] Visit Date: 09/19/2018  Today's Provider: Trinna Post, PA-C   Chief Complaint  Patient presents with  . URI   Subjective:     HPI Upper Respiratory Infection: Patient complains of symptoms of a URI, possible sinusitis. Symptoms include bilateral ear pain, congestion, cough, fever, sore throat and swollen glands. Onset of symptoms was 2 weeks ago, gradually worsening since that time. She also c/o congestion, cough described as nonproductive, low grade fever, sinus pressure and sore throat for the past 1 week .  She is drinking plenty of fluids. Reports her temperature is 99.59F but that she normally runs at 47F so this is a fever for her. Evaluation to date: none. Treatment to date: none. Denies taking allergy medication, does take flonase as needed. Does not take mucinex or delsym. Did get flu shot this year. Denies nausea or vomiting. Does have some diarrhea.   Temp Readings from Last 3 Encounters:  09/19/18 99.1 F (37.3 C) (Oral)  04/13/18 98.6 F (37 C) (Oral)  10/05/17 98.6 F (37 C) (Oral)     No Known Allergies   Current Outpatient Medications:  .  Cholecalciferol (D 2000) 2000 units TABS, Take 1 tablet by mouth daily., Disp: , Rfl:  .  fluticasone (FLONASE) 50 MCG/ACT nasal spray, Place 2 sprays into both nostrils as needed., Disp: 16 g, Rfl: 0 .  lansoprazole (PREVACID) 30 MG capsule, TAKE ONE CAPSULE BY MOUTH EVERY DAY AT 12 NOON, Disp: 30 capsule, Rfl: 7 .  Multiple Vitamin (MULTI-VITAMINS) TABS, Take 1 tablet by mouth daily., Disp: , Rfl:  .  triamcinolone ointment (KENALOG) 0.5 %, Apply 1 application topically 2 (two) times daily., Disp: 60 g, Rfl: 2 .  triamterene-hydrochlorothiazide (MAXZIDE-25) 37.5-25 MG tablet, TAKE 1 TABLET BY MOUTH DAILY, Disp: 90 tablet, Rfl: 3 .  valACYclovir (VALTREX) 1000 MG tablet, TAKE 2 TABLETS TWICE DAILY AS NEEDED, Disp: 30 tablet, Rfl:  5  Review of Systems  Constitutional: Positive for fatigue.  HENT: Positive for sinus pressure, sinus pain and sore throat.   Respiratory: Positive for cough.     Social History   Tobacco Use  . Smoking status: Never Smoker  . Smokeless tobacco: Never Used  Substance Use Topics  . Alcohol use: Yes    Comment: none last 24hrs      Objective:   BP (!) 160/83 (BP Location: Left Arm, Patient Position: Sitting, Cuff Size: Normal)   Pulse 77   Temp 99.1 F (37.3 C) (Oral)   Resp 16   Wt 261 lb (118.4 kg)   SpO2 98%   BMI 40.88 kg/m  Vitals:   09/19/18 1506  BP: (!) 160/83  Pulse: 77  Resp: 16  Temp: 99.1 F (37.3 C)  TempSrc: Oral  SpO2: 98%  Weight: 261 lb (118.4 kg)     Physical Exam Constitutional:      General: She is not in acute distress.    Appearance: She is well-developed. She is not diaphoretic.  HENT:     Right Ear: External ear normal.     Left Ear: External ear normal.     Nose:     Right Sinus: Maxillary sinus tenderness and frontal sinus tenderness present.     Left Sinus: Maxillary sinus tenderness and frontal sinus tenderness present.     Mouth/Throat:     Pharynx: No oropharyngeal exudate or posterior  oropharyngeal erythema.  Eyes:     General:        Right eye: No discharge.        Left eye: No discharge.     Conjunctiva/sclera: Conjunctivae normal.  Neck:     Musculoskeletal: Neck supple.  Cardiovascular:     Rate and Rhythm: Normal rate and regular rhythm.  Pulmonary:     Effort: Pulmonary effort is normal.     Breath sounds: Rales present.     Comments: Crackles in LLL Lymphadenopathy:     Cervical: No cervical adenopathy.  Skin:    General: Skin is warm and dry.  Neurological:     Mental Status: She is alert and oriented to person, place, and time.  Psychiatric:        Behavior: Behavior normal.         Assessment & Plan    1. Fever, unspecified fever cause  Rapid flu negative. CXR redemonsrates left lung scarring  present on 2014 CXR. I have personally reviewed this image and agree with the findings.   - POCT Influenza A/B - DG Chest 2 View; Future  2. Sinus congestion  Amoxicillin 875 mg BID x 7 days.   3. Respiratory crackles at left lung base  - DG Chest 2 View; Future  The entirety of the information documented in the History of Present Illness, Review of Systems and Physical Exam were personally obtained by me. Portions of this information were initially documented by Lynford Humphrey, CMA and reviewed by me for thoroughness and accuracy.   Return if symptoms worsen or fail to improve.  I have spent 25 minutes with this patient, >50% of which was spent on counseling and coordination of care.      Trinna Post, PA-C  [facility]

## 2018-09-19 NOTE — Patient Instructions (Addendum)
2nd generation antihistamines: zyrtec, claritin, allegra, or xyzal daily flonase nasal spray daily mucinex daily   Community-Acquired Pneumonia, Adult Pneumonia is an infection of the lungs. It causes swelling in the airways of the lungs. Mucus and fluid may also build up inside the airways. One type of pneumonia can happen while a person is in a hospital. A different type can happen when a person is not in a hospital (community-acquired pneumonia).  What are the causes?  This condition is caused by germs (viruses, bacteria, or fungi). Some types of germs can be passed from one person to another. This can happen when you breathe in droplets from the cough or sneeze of an infected person. What increases the risk? You are more likely to develop this condition if you:  Have a long-term (chronic) disease, such as: ? Chronic obstructive pulmonary disease (COPD). ? Asthma. ? Cystic fibrosis. ? Congestive heart failure. ? Diabetes. ? Kidney disease.  Have HIV.  Have sickle cell disease.  Have had your spleen removed.  Do not take good care of your teeth and mouth (poor dental hygiene).  Have a medical condition that increases the risk of breathing in droplets from your own mouth and nose.  Have a weakened body defense system (immune system).  Are a smoker.  Travel to areas where the germs that cause this illness are common.  Are around certain animals or the places they live. What are the signs or symptoms?  A dry cough.  A wet (productive) cough.  Fever.  Sweating.  Chest pain. This often happens when breathing deeply or coughing.  Fast breathing or trouble breathing.  Shortness of breath.  Shaking chills.  Feeling tired (fatigue).  Muscle aches. How is this treated? Treatment for this condition depends on many things. Most adults can be treated at home. In some cases, treatment must happen in a hospital. Treatment may include:  Medicines given by mouth or  through an IV tube.  Being given extra oxygen.  Respiratory therapy. In rare cases, treatment for very bad pneumonia may include:  Using a machine to help you breathe.  Having a procedure to remove fluid from around your lungs. Follow these instructions at home: Medicines  Take over-the-counter and prescription medicines only as told by your doctor. ? Only take cough medicine if you are losing sleep.  If you were prescribed an antibiotic medicine, take it as told by your doctor. Do not stop taking the antibiotic even if you start to feel better. General instructions   Sleep with your head and neck raised (elevated). You can do this by sleeping in a recliner or by putting a few pillows under your head.  Rest as needed. Get at least 8 hours of sleep each night.  Drink enough water to keep your pee (urine) pale yellow.  Eat a healthy diet that includes plenty of vegetables, fruits, whole grains, low-fat dairy products, and lean protein.  Do not use any products that contain nicotine or tobacco. These include cigarettes, e-cigarettes, and chewing tobacco. If you need help quitting, ask your doctor.  Keep all follow-up visits as told by your doctor. This is important. How is this prevented? A shot (vaccine) can help prevent pneumonia. Shots are often suggested for:  People older than 66 years of age.  People older than 66 years of age who: ? Are having cancer treatment. ? Have long-term (chronic) lung disease. ? Have problems with their body's defense system. You may also prevent pneumonia if you  take these actions:  Get the flu (influenza) shot every year.  Go to the dentist as often as told.  Wash your hands often. If you cannot use soap and water, use hand sanitizer. Contact a doctor if:  You have a fever.  You lose sleep because your cough medicine does not help. Get help right away if:  You are short of breath and it gets worse.  You have more chest  pain.  Your sickness gets worse. This is very serious if: ? You are an older adult. ? Your body's defense system is weak.  You cough up blood. Summary  Pneumonia is an infection of the lungs.  Most adults can be treated at home. Some will need treatment in a hospital.  Drink enough water to keep your pee pale yellow.  Get at least 8 hours of sleep each night. This information is not intended to replace advice given to you by your health care provider. Make sure you discuss any questions you have with your health care provider. Document Released: 01/19/2008 Document Revised: 03/30/2018 Document Reviewed: 03/30/2018 Elsevier Interactive Patient Education  2019 [employer].

## 2018-09-20 ENCOUNTER — Telehealth: Payer: Self-pay

## 2018-09-20 DIAGNOSIS — R0981 Nasal congestion: Secondary | ICD-10-CM

## 2018-09-20 MED ORDER — AMOXICILLIN 875 MG PO TABS: 875.0000 mg | ORAL_TABLET | Freq: Two times a day (BID) | ORAL | 0 refills | Status: DC

## 2018-09-20 NOTE — Telephone Encounter (Signed)
-----   Message from Trinna Post, [place] sent at 09/20/2018  9:42 AM EST ----- There is mild stable scarring in the base of her left lung but otherwise the CXR does not show pneumonia. I can send amoxicillin 875 mg BID x 7 days for sinusitis if she is open to this. She should still try allergy medication we discussed yesterday in clinic as well.

## 2018-09-20 NOTE — Telephone Encounter (Signed)
Patient advised as below.  

## 2018-10-16 ENCOUNTER — Ambulatory Visit (INDEPENDENT_AMBULATORY_CARE_PROVIDER_SITE_OTHER): Payer: Managed Care, Other (non HMO) | Admitting: Family Medicine

## 2018-10-16 ENCOUNTER — Encounter: Payer: Self-pay | Admitting: Family Medicine

## 2018-10-16 VITALS — BP 137/91 | HR 74 | Temp 99.1°F | Resp 16 | Wt 260.2 lb

## 2018-10-16 DIAGNOSIS — R7303 Prediabetes: Secondary | ICD-10-CM | POA: Diagnosis not present

## 2018-10-16 DIAGNOSIS — I10 Essential (primary) hypertension: Secondary | ICD-10-CM

## 2018-10-16 NOTE — Patient Instructions (Signed)
Prediabetes Prediabetes is the condition of having a blood sugar (blood glucose) level that is higher than it should be, but not high enough for you to be diagnosed with type 2 diabetes. Having prediabetes puts you at risk for developing type 2 diabetes (type 2 diabetes mellitus). Prediabetes may be called impaired glucose tolerance or impaired fasting glucose. Prediabetes usually does not cause symptoms. Your health care provider can diagnose this condition with blood tests. You may be tested for prediabetes if you are overweight and if you have at least one other risk factor for prediabetes. What is blood glucose, and how is it measured? Blood glucose refers to the amount of glucose in your bloodstream. Glucose comes from eating foods that contain sugars and starches (carbohydrates), which the body breaks down into glucose. Your blood glucose level may be measured in mg/dL (milligrams per deciliter) or mmol/L (millimoles per liter). Your blood glucose may be checked with one or more of the following blood tests:  A fasting blood glucose (FBG) test. You will not be allowed to eat (you will fast) for 8 hours or longer before a blood sample is taken. ? A normal range for FBG is 70-100 mg/dl (3.9-5.6 mmol/L).  An A1c (hemoglobin A1c) blood test. This test provides information about blood glucose control over the previous 2?3months.  An oral glucose tolerance test (OGTT). This test measures your blood glucose at two times: ? After fasting. This is your baseline level. ? Two hours after you drink a beverage that contains glucose. You may be diagnosed with prediabetes:  If your FBG is 100?125 mg/dL (5.6-6.9 mmol/L).  If your A1c level is 5.7?6.4%.  If your OGTT result is 140?199 mg/dL (7.8-11 mmol/L). These blood tests may be repeated to confirm your diagnosis. How can this condition affect me? The pancreas produces a hormone (insulin) that helps to move glucose from the bloodstream into cells.  When cells in the body do not respond properly to insulin that the body makes (insulin resistance), excess glucose builds up in the blood instead of going into cells. As a result, high blood glucose (hyperglycemia) can develop, which can cause many complications. Hyperglycemia is a symptom of prediabetes. Having high blood glucose for a long time is dangerous. Too much glucose in your blood can damage your nerves and blood vessels. Long-term damage can lead to complications from diabetes, which may include:  Heart disease.  Stroke.  Blindness.  Kidney disease.  Depression.  Poor circulation in the feet and legs, which could lead to surgical removal (amputation) in severe cases. What can increase my risk? Risk factors for prediabetes include:  Having a family member with type 2 diabetes.  Being overweight or obese.  Being older than age 45.  Being of American Indian, African-American, Hispanic/Latino, or Asian/Pacific Islander descent.  Having an inactive (sedentary) lifestyle.  Having a history of heart disease.  History of gestational diabetes or polycystic ovary syndrome (PCOS), in women.  Having low levels of good cholesterol (HDL-C) or high levels of blood fats (triglycerides).  Having high blood pressure. What actions can I take to prevent diabetes?      Be physically active. ? Do moderate-intensity physical activity for 30 or more minutes on 5 or more days of the week, or as much as told by your health care provider. This could be brisk walking, biking, or water aerobics. ? Ask your health care provider what activities are safe for you. A mix of physical activities may be best, such as   or as much as told by your health care provider. This could be brisk walking, biking, or water aerobics.  ? Ask your health care provider what activities are safe for you. A mix of physical activities may be best, such as walking, swimming, cycling, and strength training.  · Lose weight as told by your health care provider.  ? Losing 5-7% of your body weight can reverse insulin resistance.  ? Your health care provider can determine how much weight loss is best for you and can help you lose weight  safely.  · Follow a healthy meal plan. This includes eating lean proteins, complex carbohydrates, fresh fruits and vegetables, low-fat dairy products, and healthy fats.  ? Follow instructions from your health care provider about eating or drinking restrictions.  ? Make an appointment to see a diet and nutrition specialist (registered dietitian) to help you create a healthy eating plan that is right for you.  · Do not smoke or use any tobacco products, such as cigarettes, chewing tobacco, and e-cigarettes. If you need help quitting, ask your health care provider.  · Take over-the-counter and prescription medicines as told by your health care provider. You may be prescribed medicines that help lower the risk of type 2 diabetes.  · Keep all follow-up visits as told by your health care provider. This is important.  Summary  · Prediabetes is the condition of having a blood sugar (blood glucose) level that is higher than it should be, but not high enough for you to be diagnosed with type 2 diabetes.  · Having prediabetes puts you at risk for developing type 2 diabetes (type 2 diabetes mellitus).  · To help prevent type 2 diabetes, make lifestyle changes such as being physically active and eating a healthy diet. Lose weight as told by your health care provider.  This information is not intended to replace advice given to you by your health care provider. Make sure you discuss any questions you have with your health care provider.  Document Released: 11/24/2015 Document Revised: 03/22/2017 Document Reviewed: 09/23/2015  Elsevier Interactive Patient Education © 2019 Elsevier Inc[employer]

## 2018-10-16 NOTE — Assessment & Plan Note (Signed)
Discussed diet and exercise 

## 2018-10-16 NOTE — Progress Notes (Signed)
Patient: Rita Bailey Female    DOB: Sep 19, 1950   69 y.o.   MRN: [id number] Visit Date: 10/16/2018  Today's Provider: Lavon Paganini, MD   Chief Complaint  Patient presents with  . Hypertension  . Hyperglycemia   Subjective:     HPI    Hypertension, follow-up:  BP Readings from Last 3 Encounters:  10/16/18 140/90  09/19/18 (!) 160/83  04/13/18 128/86    She was last seen for hypertension 6 months ago.  BP at that visit was 128/86. Management changes since that visit include none. She reports excellent compliance with treatment. She is not having side effects.  She is exercising. She is adherent to low salt diet.   Outside blood pressures are not being checked regularly. She is experiencing none.  Patient denies chest pain, chest pressure/discomfort, claudication, dyspnea, exertional chest pressure/discomfort, fatigue, irregular heart beat, lower extremity edema, near-syncope, orthopnea, palpitations, paroxysmal nocturnal dyspnea, syncope and tachypnea.   Cardiovascular risk factors include advanced age (older than 84 for men, 28 for women), hypertension and obesity (BMI >= 30 kg/m2).  Use of agents associated with hypertension: none.     Weight trend: stable Wt Readings from Last 3 Encounters:  10/16/18 260 lb 3.2 oz (118 kg)  09/19/18 261 lb (118.4 kg)  04/13/18 257 lb 6.4 oz (116.8 kg)    Current diet: well balanced  ------------------------------------------------------------------------  Prediabetes, Follow-up:   Lab Results  Component Value Date   HGBA1C 6.0 (H) 04/13/2018   GLUCOSE 103 (H) 04/13/2018   GLUCOSE 102 (H) 10/05/2017   GLUCOSE 106 (H) 04/12/2017    Last seen for for this 6 months ago.  Management since that visit includes none. Current symptoms include none and have been unchanged.  Weight trend: stable Prior visit with dietician: no Current diet: well balanced Current exercise: walking  Pertinent Labs:      Component Value Date/Time   CHOL 176 04/13/2018 1023   TRIG 101 04/13/2018 1023   CHOLHDL 2.7 04/13/2018 1023   CREATININE 0.99 04/13/2018 1023    Wt Readings from Last 3 Encounters:  10/16/18 260 lb 3.2 oz (118 kg)  09/19/18 261 lb (118.4 kg)  04/13/18 257 lb 6.4 oz (116.8 kg)     No Known Allergies   Current Outpatient Medications:  .  Cholecalciferol (D 2000) 2000 units TABS, Take 1 tablet by mouth daily., Disp: , Rfl:  .  lansoprazole (PREVACID) 30 MG capsule, TAKE ONE CAPSULE BY MOUTH EVERY DAY AT 12 NOON, Disp: 30 capsule, Rfl: 7 .  Multiple Vitamin (MULTI-VITAMINS) TABS, Take 1 tablet by mouth daily., Disp: , Rfl:  .  triamcinolone ointment (KENALOG) 0.5 %, Apply 1 application topically 2 (two) times daily., Disp: 60 g, Rfl: 2 .  triamterene-hydrochlorothiazide (MAXZIDE-25) 37.5-25 MG tablet, TAKE 1 TABLET BY MOUTH DAILY, Disp: 90 tablet, Rfl: 3 .  fluticasone (FLONASE) 50 MCG/ACT nasal spray, Place 2 sprays into both nostrils as needed. (Patient not taking: Reported on 10/16/2018), Disp: 16 g, Rfl: 0 .  valACYclovir (VALTREX) 1000 MG tablet, TAKE 2 TABLETS TWICE DAILY AS NEEDED (Patient not taking: Reported on 10/16/2018), Disp: 30 tablet, Rfl: 5  Review of Systems  Constitutional: Negative.   HENT: Negative.   Respiratory: Negative.   Cardiovascular: Negative.   Gastrointestinal: Negative.   Genitourinary: Negative.   Neurological: Negative.   Psychiatric/Behavioral: Negative.     Social History   Tobacco Use  . Smoking status: Never Smoker  . Smokeless tobacco: Never  Used  Substance Use Topics  . Alcohol use: Yes    Comment: none last 24hrs      Objective:   BP 140/90   Pulse 74   Temp 99.1 F (37.3 C) (Oral)   Resp 16   Wt 260 lb 3.2 oz (118 kg)   BMI 40.75 kg/m  Vitals:   10/16/18 0847  BP: 140/90  Pulse: 74  Resp: 16  Temp: 99.1 F (37.3 C)  TempSrc: Oral  Weight: 260 lb 3.2 oz (118 kg)     Physical Exam Vitals signs reviewed.   Constitutional:      General: She is not in acute distress.    Appearance: Normal appearance. She is obese. She is not diaphoretic.  HENT:     Head: Normocephalic and atraumatic.  Eyes:     General: No scleral icterus.    Conjunctiva/sclera: Conjunctivae normal.  Cardiovascular:     Rate and Rhythm: Normal rate and regular rhythm.     Pulses: Normal pulses.     Heart sounds: Normal heart sounds. No murmur.  Pulmonary:     Effort: Pulmonary effort is normal. No respiratory distress.     Breath sounds: Normal breath sounds. No wheezing or rhonchi.  Abdominal:     General: There is no distension.     Palpations: Abdomen is soft.     Tenderness: There is no abdominal tenderness.  Musculoskeletal:     Right lower leg: No edema.     Left lower leg: No edema.  Skin:    General: Skin is warm and dry.     Capillary Refill: Capillary refill takes less than 2 seconds.     Findings: No rash.  Neurological:     Mental Status: She is alert and oriented to person, place, and time. Mental status is at baseline.  Psychiatric:        Mood and Affect: Mood normal.        Behavior: Behavior normal.         Assessment & Plan   Problem List Items Addressed This Visit      Cardiovascular and Mediastinum   Hypertension - Primary    Well controlled Continue current meds Recheck BMP Follow-up in 6 months      Relevant Orders   Basic Metabolic Panel (BMET)     Other   Morbid obesity ([place])    Discussed diet and exercise      Prediabetes    Discussed importance of blood sugar control Recheck A1c Discussed low-carb diet      Relevant Orders   Hemoglobin A1c       Return in about 6 months (around 04/18/2019) for CPE.   The entirety of the information documented in the History of Present Illness, Review of Systems and Physical Exam were personally obtained by me. Portions of this information were initially documented by Hebert Soho, CMA and reviewed by me for thoroughness and  accuracy.    Virginia Crews, MD, MPH [facility] 10/16/2018 10:52 AM

## 2018-10-16 NOTE — Assessment & Plan Note (Signed)
Discussed importance of blood sugar control Recheck A1c Discussed low-carb diet

## 2018-10-16 NOTE — Assessment & Plan Note (Signed)
Well controlled Continue current meds Recheck BMP Follow-up in 6 months

## 2018-10-17 LAB — BASIC METABOLIC PANEL
BUN/Creatinine Ratio: 17 (ref 12–28)
BUN: 17 mg/dL (ref 8–27)
CO2: 24 mmol/L (ref 20–29)
Calcium: 9 mg/dL (ref 8.7–10.3)
Chloride: 107 mmol/L — ABNORMAL HIGH (ref 96–106)
Creatinine, Ser: 1.03 mg/dL — ABNORMAL HIGH (ref 0.57–1.00)
GFR calc Af Amer: 66 mL/min/{1.73_m2} (ref >59)
GFR calc non Af Amer: 57 mL/min/{1.73_m2} — ABNORMAL LOW (ref >59)
Glucose: 112 mg/dL — ABNORMAL HIGH (ref 65–99)
Potassium: 3.7 mmol/L (ref 3.5–5.2)
Sodium: 143 mmol/L (ref 134–144)

## 2018-10-17 LAB — HEMOGLOBIN A1C
Est. average glucose Bld gHb Est-mCnc: 126 mg/dL
Hgb A1c MFr Bld: 6 % — ABNORMAL HIGH (ref 4.8–5.6)

## 2019-04-16 ENCOUNTER — Encounter: Payer: Self-pay | Admitting: Family Medicine

## 2019-04-16 ENCOUNTER — Ambulatory Visit (INDEPENDENT_AMBULATORY_CARE_PROVIDER_SITE_OTHER): Payer: Managed Care, Other (non HMO) | Admitting: Family Medicine

## 2019-04-16 ENCOUNTER — Other Ambulatory Visit: Payer: Self-pay

## 2019-04-16 VITALS — BP 127/86 | HR 76 | Temp 96.9°F | Ht 67.0 in | Wt 254.8 lb

## 2019-04-16 DIAGNOSIS — Z1239 Encounter for other screening for malignant neoplasm of breast: Secondary | ICD-10-CM

## 2019-04-16 DIAGNOSIS — R7303 Prediabetes: Secondary | ICD-10-CM

## 2019-04-16 DIAGNOSIS — Z0000 Encounter for general adult medical examination without abnormal findings: Secondary | ICD-10-CM | POA: Diagnosis not present

## 2019-04-16 DIAGNOSIS — Z23 Encounter for immunization: Secondary | ICD-10-CM | POA: Diagnosis not present

## 2019-04-16 DIAGNOSIS — Z780 Asymptomatic menopausal state: Secondary | ICD-10-CM | POA: Diagnosis not present

## 2019-04-16 DIAGNOSIS — I10 Essential (primary) hypertension: Secondary | ICD-10-CM

## 2019-04-16 MED ORDER — FLUCONAZOLE 150 MG PO TABS: 150.0000 mg | ORAL_TABLET | Freq: Once | ORAL | 0 refills | Status: AC

## 2019-04-16 NOTE — Assessment & Plan Note (Signed)
Recheck A1c Low carb diet

## 2019-04-16 NOTE — Assessment & Plan Note (Signed)
Well controlled Continue current medications Recheck metabolic panel F/u in 6 months  

## 2019-04-16 NOTE — Patient Instructions (Signed)
Preventive Care 38 Years and Older, Female Preventive care refers to lifestyle choices and visits with your health care provider that can promote health and wellness. This includes:  A yearly physical exam. This is also called an annual well check.  Regular dental and eye exams.  Immunizations.  Screening for certain conditions.  Healthy lifestyle choices, such as diet and exercise. What can I expect for my preventive care visit? Physical exam Your health care provider will check:  Height and weight. These may be used to calculate body mass index (BMI), which is a measurement that tells if you are at a healthy weight.  Heart rate and blood pressure.  Your skin for abnormal spots. Counseling Your health care provider may ask you questions about:  Alcohol, tobacco, and drug use.  Emotional well-being.  Home and relationship well-being.  Sexual activity.  Eating habits.  History of falls.  Memory and ability to understand (cognition).  Work and work Statistician.  Pregnancy and menstrual history. What immunizations do I need?  Influenza (flu) vaccine  This is recommended every year. Tetanus, diphtheria, and pertussis (Tdap) vaccine  You may need a Td booster every 10 years. Varicella (chickenpox) vaccine  You may need this vaccine if you have not already been vaccinated. Zoster (shingles) vaccine  You may need this after age 33. Pneumococcal conjugate (PCV13) vaccine  One dose is recommended after age 33. Pneumococcal polysaccharide (PPSV23) vaccine  One dose is recommended after age 72. Measles, mumps, and rubella (MMR) vaccine  You may need at least one dose of MMR if you were born in 1957 or later. You may also need a second dose. Meningococcal conjugate (MenACWY) vaccine  You may need this if you have certain conditions. Hepatitis A vaccine  You may need this if you have certain conditions or if you travel or work in places where you may be exposed  to hepatitis A. Hepatitis B vaccine  You may need this if you have certain conditions or if you travel or work in places where you may be exposed to hepatitis B. Haemophilus influenzae type b (Hib) vaccine  You may need this if you have certain conditions. You may receive vaccines as individual doses or as more than one vaccine together in one shot (combination vaccines). Talk with your health care provider about the risks and benefits of combination vaccines. What tests do I need? Blood tests  Lipid and cholesterol levels. These may be checked every 5 years, or more frequently depending on your overall health.  Hepatitis C test.  Hepatitis B test. Screening  Lung cancer screening. You may have this screening every year starting at age 39 if you have a 30-pack-year history of smoking and currently smoke or have quit within the past 15 years.  Colorectal cancer screening. All adults should have this screening starting at age 36 and continuing until age 15. Your health care provider may recommend screening at age 23 if you are at increased risk. You will have tests every 1-10 years, depending on your results and the type of screening test.  Diabetes screening. This is done by checking your blood sugar (glucose) after you have not eaten for a while (fasting). You may have this done every 1-3 years.  Mammogram. This may be done every 1-2 years. Talk with your health care provider about how often you should have regular mammograms.  BRCA-related cancer screening. This may be done if you have a family history of breast, ovarian, tubal, or peritoneal cancers.  Other tests  Sexually transmitted disease (STD) testing.  Bone density scan. This is done to screen for osteoporosis. You may have this done starting at age 76. Follow these instructions at home: Eating and drinking  Eat a diet that includes fresh fruits and vegetables, whole grains, lean protein, and low-fat dairy products. Limit  your intake of foods with high amounts of sugar, saturated fats, and salt.  Take vitamin and mineral supplements as recommended by your health care provider.  Do not drink alcohol if your health care provider tells you not to drink.  If you drink alcohol: ? Limit how much you have to 0-1 drink a day. ? Be aware of how much alcohol is in your drink. In the [place] one drink equals one 12 oz bottle of beer (355 mL), one 5 oz glass of wine (148 mL), or one 1 oz glass of hard liquor (44 mL). Lifestyle  Take daily care of your teeth and gums.  Stay active. Exercise for at least 30 minutes on 5 or more days each week.  Do not use any products that contain nicotine or tobacco, such as cigarettes, e-cigarettes, and chewing tobacco. If you need help quitting, ask your health care provider.  If you are sexually active, practice safe sex. Use a condom or other form of protection in order to prevent STIs (sexually transmitted infections).  Talk with your health care provider about taking a low-dose aspirin or statin. What's next?  Go to your health care provider once a year for a well check visit.  Ask your health care provider how often you should have your eyes and teeth checked.  Stay up to date on all vaccines. This information is not intended to replace advice given to you by your health care provider. Make sure you discuss any questions you have with your health care provider. Document Released: 08/29/2015 Document Revised: 07/27/2018 Document Reviewed: 07/27/2018 Elsevier Patient Education  2020 [employer].

## 2019-04-16 NOTE — Progress Notes (Signed)
Patient: Rita Bailey, Female    DOB: Apr 01, 1951, 68 y.o.   MRN: [id number] Visit Date: 04/16/2019  Today's Provider: Lavon Paganini, MD   Chief Complaint  Patient presents with  . Annual Exam   Subjective:  I, Porsha McClurkin CMA, am acting as a scribe for Lavon Paganini, MD.    Annual Physical Rita Bailey is a 68 y.o. female who presents today for her Subsequent Annual Wellness Visit. She feels well. She reports exercising walking. She reports she is sleeping well.  ----------------------------------------------------------- Gets yeast infections in the summer.  Feels itchy externally. Diflucan usually treats this well  Seeing Derm for skin cancer of R upper arm and AKs.  Has excision coming up.  Review of Systems  Constitutional: Negative.   HENT: Positive for tinnitus.   Eyes: Negative.   Respiratory: Negative.   Cardiovascular: Negative.   Gastrointestinal: Negative.   Endocrine: Negative.   Genitourinary: Negative.   Musculoskeletal: Positive for back pain and gait problem.  Skin: Negative.   Allergic/Immunologic: Negative.   Hematological: Negative.   Psychiatric/Behavioral: Negative.     Social History   Socioeconomic History  . Marital status: Married    Spouse name: Dorothyann Peng  . Number of children: 1  . Years of education: Bachelors  . Highest education level: Not on file  Occupational History    Employer: [employer]  Social Needs  . Financial resource strain: Not on file  . Food insecurity    Worry: Not on file    Inability: Not on file  . Transportation needs    Medical: Not on file    Non-medical: Not on file  Tobacco Use  . Smoking status: Never Smoker  . Smokeless tobacco: Never Used  Substance and Sexual Activity  . Alcohol use: Yes    Comment: none last 24hrs  . Drug use: No  . Sexual activity: Yes  Lifestyle  . Physical activity    Days per week: Not on file    Minutes per session: Not on file  . Stress:  Not on file  Relationships  . Social Herbalist on phone: Not on file    Gets together: Not on file    Attends religious service: Not on file    Active member of club or organization: Not on file    Attends meetings of clubs or organizations: Not on file    Relationship status: Not on file  . Intimate partner violence    Fear of current or ex partner: Not on file    Emotionally abused: Not on file    Physically abused: Not on file    Forced sexual activity: Not on file  Other Topics Concern  . Not on file  Social History Narrative  . Not on file    Patient Active Problem List   Diagnosis Date Noted  . Eczema 04/13/2018  . Irritable bowel syndrome with diarrhea 04/13/2018  . Prediabetes 04/13/2018  . PMB (postmenopausal bleeding) 10/05/2017  . Gastro-esophageal reflux disease with esophagitis 05/13/2017  . Strain of muscle of right hip 05/06/2017  . Bilateral hearing loss 04/12/2017  . Scoliosis of lumbosacral spine 03/30/2017  . Abnormality of gait and mobility 03/30/2017  . Allergic rhinitis 09/08/2015  . Hypertension 09/08/2015  . H/O infectious disease 09/08/2015  . Gonalgia 09/08/2015  . RAD (reactive airway disease) 09/08/2015  . Morbid obesity ([place]) 03/12/2015  . Current tear of meniscus 01/02/2015  . Tear of meniscus of knee  01/02/2015    Past Surgical History:  Procedure Laterality Date  . CESAREAN SECTION    . COLONOSCOPY WITH PROPOFOL N/A 05/11/2017   Procedure: COLONOSCOPY WITH PROPOFOL;  Surgeon: Robert Bellow, MD;  Location: [facility] ENDOSCOPY;  Service: Endoscopy;  Laterality: N/A;  . DILATATION & CURETTAGE/HYSTEROSCOPY WITH MYOSURE N/A 02/27/2016   Procedure: DILATATION & CURETTAGE/HYSTEROSCOPY WITH MYOSURE;  Surgeon: Boykin Nearing, MD;  Location: [facility] ORS;  Service: Gynecology;  Laterality: N/A;  . ESOPHAGOGASTRODUODENOSCOPY (EGD) WITH PROPOFOL N/A 05/11/2017   Procedure: ESOPHAGOGASTRODUODENOSCOPY (EGD) WITH PROPOFOL;  Surgeon:  Robert Bellow, MD;  Location: [facility] ENDOSCOPY;  Service: Endoscopy;  Laterality: N/A;  . sebaceous syst removed  08/24/2011  . tonsillectomy    . TONSILLECTOMY      Her family history includes Cerebral palsy in her son; Diabetes in her father and mother; Hypertension in her mother.     Previous Medications   CHOLECALCIFEROL (D 2000) 2000 UNITS TABS    Take 1 tablet by mouth daily.   CLOBETASOL OINTMENT (TEMOVATE) 0.05 %    APP TOPICALLY TO ROUGH SPOTS ON THE LEGS BID UNTIL SMOOTH   FLUTICASONE (FLONASE) 50 MCG/ACT NASAL SPRAY    Place 2 sprays into both nostrils as needed.   MULTIPLE VITAMIN (MULTI-VITAMINS) TABS    Take 1 tablet by mouth daily.   TRIAMCINOLONE OINTMENT (KENALOG) 0.5 %    Apply 1 application topically 2 (two) times daily.   TRIAMTERENE-HYDROCHLOROTHIAZIDE (MAXZIDE-25) 37.5-25 MG TABLET    TAKE 1 TABLET BY MOUTH DAILY   VALACYCLOVIR (VALTREX) 1000 MG TABLET    TAKE 2 TABLETS TWICE DAILY AS NEEDED    Patient Care Team: Virginia Crews, MD as PCP - General (Family Medicine)      Objective:   Vitals: BP 127/86 (BP Location: Left Arm, Patient Position: Sitting, Cuff Size: Large)   Pulse 76   Temp (!) 96.9 F (36.1 C) (Temporal)   Ht 5\' 7"  (1.702 m)   Wt 254 lb 12.8 oz (115.6 kg)   SpO2 97%   BMI 39.91 kg/m   Physical Exam Vitals signs reviewed.  Constitutional:      General: She is not in acute distress.    Appearance: Normal appearance. She is well-developed. She is not diaphoretic.  HENT:     Head: Normocephalic and atraumatic.     Right Ear: Tympanic membrane, ear canal and external ear normal.     Left Ear: Tympanic membrane, ear canal and external ear normal.  Eyes:     General: No scleral icterus.    Conjunctiva/sclera: Conjunctivae normal.     Pupils: Pupils are equal, round, and reactive to light.  Neck:     Musculoskeletal: Neck supple.     Thyroid: No thyromegaly.  Cardiovascular:     Rate and Rhythm: Normal rate and regular rhythm.      Pulses: Normal pulses.     Heart sounds: Normal heart sounds. No murmur.  Pulmonary:     Effort: Pulmonary effort is normal. No respiratory distress.     Breath sounds: Normal breath sounds. No wheezing or rales.  Abdominal:     General: Bowel sounds are normal. There is no distension.     Palpations: Abdomen is soft.     Tenderness: There is no abdominal tenderness. There is no guarding or rebound.  Genitourinary:    Comments: GYN:  External genitalia within normal limits.  Small pimple on R labia majora   Breasts: breasts appear normal, no suspicious masses, no  skin or nipple changes or axillary nodes.  Musculoskeletal:        General: No deformity.     Right lower leg: No edema.     Left lower leg: No edema.  Lymphadenopathy:     Cervical: No cervical adenopathy.  Skin:    General: Skin is warm and dry.     Capillary Refill: Capillary refill takes less than 2 seconds.     Findings: No rash.  Neurological:     Mental Status: She is alert and oriented to person, place, and time. Mental status is at baseline.  Psychiatric:        Mood and Affect: Mood normal.        Behavior: Behavior normal.        Thought Content: Thought content normal.     Activities of Daily Living In your present state of health, do you have any difficulty performing the following activities: 04/16/2019  Hearing? Y  Comment Wear hearing aids  Vision? Y  Comment Wear glasses  Difficulty concentrating or making decisions? N  Walking or climbing stairs? N  Dressing or bathing? N  Doing errands, shopping? N  Some recent data might be hidden    Fall Risk Assessment Fall Risk  04/16/2019 04/13/2018 04/12/2017 09/15/2015  Falls in the past year? 0 No No No  Number falls in past yr: 0 - - -  Injury with Fall? 0 - - -     Depression Screen PHQ 2/9 Scores 04/16/2019 04/13/2018 04/12/2017 09/15/2015  PHQ - 2 Score 0 0 0 -  PHQ- 9 Score 0 0 - -  Exception Documentation - - - Patient refusal    6CIT Screen  04/16/2019  What Year? 0 points  What month? 0 points  What time? 0 points  Count back from 20 0 points  Months in reverse 0 points  Repeat phrase 2 points  Total Score 2    Assessment & Plan:     Annual Physical  Reviewed patient's Family Medical History Reviewed and updated list of patient's medical providers Assessment of cognitive impairment was done Assessed patient's functional ability Established a written schedule for health screening [place] Completed and Reviewed  Exercise Activities and Dietary recommendations Goals   None     Immunization History  Administered Date(s) Administered  . Influenza,inj,Quad PF,6+ Mos 06/30/2017  . Influenza-Unspecified 05/17/2015  . Tdap 04/29/2009  . Zoster Recombinat (Shingrix) 04/12/2017, 10/12/2017    Health Maintenance  Topic Date Due  . DEXA SCAN  07/06/2018  . PNA vac Low Risk Adult (1 of 2 - PCV13) 07/06/2018  . INFLUENZA VACCINE  03/17/2019  . TETANUS/TDAP  04/30/2019  . MAMMOGRAM  06/21/2020  . PAP SMEAR-Modifier  01/08/2021  . COLONOSCOPY  05/11/2022  . Hepatitis C Screening  Completed  . HIV Screening  Completed     Discussed health benefits of physical activity, and encouraged her to engage in regular exercise appropriate for her age and condition.    ------------------------------------------------------------------------------------------------------------  Problem List Items Addressed This Visit      Cardiovascular and Mediastinum   Hypertension    Well controlled Continue current medications Recheck metabolic panel F/u in 6 months       Relevant Orders   Comprehensive metabolic panel   Lipid panel     Other   Morbid obesity ([place])    Discussed importance of healthy weight management Discussed diet and exercise       Relevant Orders   Comprehensive  metabolic panel   Lipid panel   Hemoglobin A1c   Prediabetes    Recheck A1c Low carb diet      Relevant Orders    Hemoglobin A1c    Other Visit Diagnoses    Encounter for annual physical exam    -  Primary   Postmenopausal       Relevant Orders   DG Bone Density   Screening for breast cancer       Relevant Orders   MM 3D SCREEN BREAST BILATERAL   Need for vaccine for Td (tetanus-diphtheria)       Relevant Orders   Td : Tetanus/diphtheria >7yo Preservative  free   Need for pneumococcal vaccination       Relevant Orders   Pneumococcal polysaccharide vaccine 23-valent greater than or equal to 2yo subcutaneous/IM       Return in about 6 months (around 10/14/2019) for chronic disease f/u. Will return in 1 month for flu vaccine   The entirety of the information documented in the History of Present Illness, Review of Systems and Physical Exam were personally obtained by me. Portions of this information were initially documented by [facility] and Tiburcio Pea, [place] and reviewed by me for thoroughness and accuracy.    Jeanene Mena, Dionne Bucy, MD MPH [facility]

## 2019-04-16 NOTE — Assessment & Plan Note (Signed)
Discussed importance of healthy weight management Discussed diet and exercise  

## 2019-04-17 ENCOUNTER — Telehealth: Payer: Self-pay

## 2019-04-17 DIAGNOSIS — N289 Disorder of kidney and ureter, unspecified: Secondary | ICD-10-CM

## 2019-04-17 LAB — COMPREHENSIVE METABOLIC PANEL
ALT: 17 IU/L (ref 0–32)
AST: 21 IU/L (ref 0–40)
Albumin/Globulin Ratio: 1.5 (ref 1.2–2.2)
Albumin: 4.2 g/dL (ref 3.8–4.8)
Alkaline Phosphatase: 69 IU/L (ref 39–117)
BUN/Creatinine Ratio: 15 (ref 12–28)
BUN: 18 mg/dL (ref 8–27)
Bilirubin Total: 0.6 mg/dL (ref 0.0–1.2)
CO2: 22 mmol/L (ref 20–29)
Calcium: 9.5 mg/dL (ref 8.7–10.3)
Chloride: 103 mmol/L (ref 96–106)
Creatinine, Ser: 1.21 mg/dL — ABNORMAL HIGH (ref 0.57–1.00)
GFR calc Af Amer: 54 mL/min/{1.73_m2} — ABNORMAL LOW (ref >59)
GFR calc non Af Amer: 47 mL/min/{1.73_m2} — ABNORMAL LOW (ref >59)
Globulin, Total: 2.8 g/dL (ref 1.5–4.5)
Glucose: 103 mg/dL — ABNORMAL HIGH (ref 65–99)
Potassium: 3.6 mmol/L (ref 3.5–5.2)
Sodium: 141 mmol/L (ref 134–144)
Total Protein: 7 g/dL (ref 6.0–8.5)

## 2019-04-17 LAB — LIPID PANEL
Chol/HDL Ratio: 3 ratio (ref 0.0–4.4)
Cholesterol, Total: 185 mg/dL (ref 100–199)
HDL: 62 mg/dL (ref >39)
LDL Chol Calc (NIH): 103 mg/dL — ABNORMAL HIGH (ref 0–99)
Triglycerides: 112 mg/dL (ref 0–149)
VLDL Cholesterol Cal: 20 mg/dL (ref 5–40)

## 2019-04-17 LAB — HEMOGLOBIN A1C
Est. average glucose Bld gHb Est-mCnc: 123 mg/dL
Hgb A1c MFr Bld: 5.9 % — ABNORMAL HIGH (ref 4.8–5.6)

## 2019-04-17 MED ORDER — HYDROCHLOROTHIAZIDE 25 MG PO TABS: 25.0000 mg | ORAL_TABLET | Freq: Every day | ORAL | 1 refills | Status: DC

## 2019-04-17 NOTE — Telephone Encounter (Signed)
Patient advised. RX sent to pharmacy. Lab slipped printed at the front desk.

## 2019-04-17 NOTE — Telephone Encounter (Signed)
-----   Message from Virginia Crews, MD sent at 04/17/2019  8:51 AM EDT ----- Normal electrolytes and liver function.  Cholesterol is stable. A1c is stable in prediabetic range. Recommend low carb diet.  Kidney function is worsening over the last year.  Recommend switching BP medication from Maxzide to HCTZ 25 mg daily.  Would want to repeat renal function panel in 1 month. Watch home BPs and make sure they are not increasing after the switch.

## 2019-05-15 ENCOUNTER — Telehealth: Payer: Self-pay | Admitting: *Deleted

## 2019-05-15 NOTE — Telephone Encounter (Signed)
Patient had awv 04/16/2019. Pt states she was billed for diagnostic labs instead of AWV labs. Patient wanted to know what she should do. Please advise?

## 2019-05-16 NOTE — Telephone Encounter (Signed)
Patient was advised. Patient states that she will not be responsible for paying a bill because of how Dr. B coded the labs orders. Patient states that the office should be the one to contact [place] for a change form because she is not the one who is requesting the change form to correct the labs dx. Patient would like a call from Dr.B because she did not seem to care about the message. Please advise.

## 2019-05-16 NOTE — Telephone Encounter (Signed)
They were ordered as normal.  She can ask [facility] to send [place] a form to re-associate diagnosis codes and we can try again.

## 2019-05-16 NOTE — Telephone Encounter (Signed)
There is no way on our end to change the codes with the labs.  [facility] can send a change form, but there is nothing else we can do.

## 2019-05-16 NOTE — Telephone Encounter (Signed)
Patient was advised of message as below, but she states that when she was on 3 way call with [employer] and [facility] the representative from [place] states the diagnosis code is incorrect and that it was not billed as a wellness. Patient states that you would have to resubmit orders with the code. KW

## 2019-05-17 NOTE — Telephone Encounter (Signed)
Clair Gulling with [place] asking for a dx code to be updated so insurance will pay.  Clair Gulling states the code should be z0000 preventive.  CB#  [phone]  [place]

## 2019-05-17 NOTE — Telephone Encounter (Signed)
Yes. I changed it, but we can call [facility] and see if they will change it as well

## 2019-05-17 NOTE — Telephone Encounter (Signed)
The visit was done with that code as the primary diagnosis.  The lab association is corrected, but this typically does not resubmit.  Perhaps Christella Scheuermann can see if it resubmitted.  Usually it is difficult to change lab associations after the fact.  Also these labs were drawn for her medical problems, which they were associated with.

## 2019-05-17 NOTE — Telephone Encounter (Signed)
If these test were done in association with her physical, then Z00.00 can be used in replace of the other codes.  [facility] would need to be called to change diagnosis code.

## 2019-05-18 NOTE — Telephone Encounter (Signed)
Representative w/ [facility] states that [employer] paid patients balance in full and they did not need any additional information. Patient was advised as well.FYI

## 2019-05-23 ENCOUNTER — Ambulatory Visit (INDEPENDENT_AMBULATORY_CARE_PROVIDER_SITE_OTHER): Payer: Managed Care, Other (non HMO)

## 2019-05-23 ENCOUNTER — Other Ambulatory Visit: Payer: Self-pay

## 2019-05-23 DIAGNOSIS — Z23 Encounter for immunization: Secondary | ICD-10-CM

## 2019-06-04 ENCOUNTER — Other Ambulatory Visit: Payer: Self-pay | Admitting: Family Medicine

## 2019-06-04 ENCOUNTER — Telehealth: Payer: Self-pay | Admitting: Family Medicine

## 2019-06-04 DIAGNOSIS — I10 Essential (primary) hypertension: Secondary | ICD-10-CM

## 2019-06-04 DIAGNOSIS — R7303 Prediabetes: Secondary | ICD-10-CM

## 2019-06-04 MED ORDER — VALACYCLOVIR HCL 1 G PO TABS: ORAL_TABLET | ORAL | 5 refills | Status: DC

## 2019-06-04 NOTE — Telephone Encounter (Signed)
I don't see this on her med list.  Please advised.   Thanks,   -Mickel Baas

## 2019-06-04 NOTE — Telephone Encounter (Signed)
[facility] faxed refill request for the following medications:    triamterene-hydrochlorothiazide (MAXZIDE-25) 37.5-25 MG tablet   Please advise.  

## 2019-06-04 NOTE — Telephone Encounter (Signed)
Appears patient is taking just HCTZ, not triamterene HCTZ.  Appears this was switched in 2019.  We can send refill of HCTZ if needed

## 2019-06-11 ENCOUNTER — Telehealth: Payer: Self-pay | Admitting: Family Medicine

## 2019-06-11 NOTE — Telephone Encounter (Signed)
If I'm not mistaking does this pt take just HCTZ 25mg ?  Thanks,   -Mickel Baas

## 2019-06-11 NOTE — Telephone Encounter (Signed)
[facility] faxed refill request for the following medications:  Triamterene 37.6 mg  hctz 25 mg tabs  - 90   Last Rx: 03-02-19 Take 1 tablet by mouth daily   Please advise.  Thanks, [employer]

## 2019-06-12 NOTE — Telephone Encounter (Signed)
[facility] advised.

## 2019-06-12 NOTE — Telephone Encounter (Signed)
You are correct.  Patient is just taking HCTZ 25 mg.  She was switched from triamterene HCTZ within the last few months.  We can tell [facility] to discontinue that prescription

## 2019-06-28 ENCOUNTER — Ambulatory Visit
Admission: RE | Admit: 2019-06-28 | Discharge: 2019-06-28 | Disposition: A | Discharge status: Home / Self Care | Payer: Managed Care, Other (non HMO) | Source: Ambulatory Visit | Attending: Family Medicine | Admitting: Family Medicine

## 2019-06-28 ENCOUNTER — Telehealth: Payer: Self-pay

## 2019-06-28 DIAGNOSIS — Z780 Asymptomatic menopausal state: Secondary | ICD-10-CM | POA: Diagnosis present

## 2019-06-28 DIAGNOSIS — Z0000 Encounter for general adult medical examination without abnormal findings: Secondary | ICD-10-CM | POA: Insufficient documentation

## 2019-06-28 DIAGNOSIS — Z1239 Encounter for other screening for malignant neoplasm of breast: Secondary | ICD-10-CM

## 2019-06-28 NOTE — Telephone Encounter (Signed)
LMTCB

## 2019-06-28 NOTE — Telephone Encounter (Signed)
-----   Message from Virginia Crews, MD sent at 06/28/2019  4:05 PM EST ----- Normal bone density

## 2019-06-29 NOTE — Telephone Encounter (Signed)
Patient was notified of results. Expressed understanding.  

## 2019-06-29 NOTE — Telephone Encounter (Signed)
-----   Message from Virginia Crews, MD sent at 06/29/2019 10:57 AM EST ----- Normal mammogram. Repeat in 1 yr

## 2019-10-07 ENCOUNTER — Encounter: Payer: Self-pay | Admitting: Family Medicine

## 2019-10-08 MED ORDER — HYDROCHLOROTHIAZIDE 25 MG PO TABS: 25.0000 mg | ORAL_TABLET | Freq: Every day | ORAL | 1 refills | Status: DC

## 2019-11-26 ENCOUNTER — Ambulatory Visit (INDEPENDENT_AMBULATORY_CARE_PROVIDER_SITE_OTHER): Payer: Medicare Other | Admitting: Family Medicine

## 2019-11-26 ENCOUNTER — Other Ambulatory Visit: Payer: Self-pay

## 2019-11-26 ENCOUNTER — Encounter: Payer: Self-pay | Admitting: Family Medicine

## 2019-11-26 VITALS — BP 140/82 | HR 72 | Temp 96.6°F | Resp 16 | Ht 67.0 in | Wt 252.0 lb

## 2019-11-26 DIAGNOSIS — Z0000 Encounter for general adult medical examination without abnormal findings: Secondary | ICD-10-CM | POA: Diagnosis not present

## 2019-11-26 NOTE — Progress Notes (Deleted)
Patient: Rita Bailey, Female    DOB: 1951/04/29, 69 y.o.   MRN: [id number] Visit Date: 11/26/2019  Today's Provider: Lavon Paganini, MD   Chief Complaint  Patient presents with  . Medicare Physical   Subjective:     Complete Physical Rita Bailey is a 69 y.o. female. She feels {DESC; WELL/FAIRLY WELL/POORLY:18703}. She reports exercising ***. She reports she is sleeping {DESC; WELL/FAIRLY WELL/POORLY:18703}.  ----------------------------------------------------------- 1st Pneumo 23 : 04/16/2019 Colonoscopy : 05/11/2017 Two 6 mm polyps in the sigmoid colon and in the descending colon. Biopsied./Diverticulosis Mammogram: 06/29/2019 BI-RADS 1 Negative   Review of Systems  Constitutional: Negative.   HENT: Negative.   Eyes: Negative.   Respiratory: Negative.   Cardiovascular: Negative.   Gastrointestinal: Negative.   Endocrine: Negative.   Genitourinary: Negative.   Musculoskeletal: Negative.   Skin: Positive for wound.  Allergic/Immunologic: Negative.   Neurological: Negative.   Hematological: Negative.   Psychiatric/Behavioral: Negative.     Social History   Socioeconomic History  . Marital status: Married    Spouse name: Dorothyann Peng  . Number of children: 1  . Years of education: Bachelors  . Highest education level: Not on file  Occupational History    Employer: [employer]  Tobacco Use  . Smoking status: Never Smoker  . Smokeless tobacco: Never Used  Substance and Sexual Activity  . Alcohol use: Yes    Comment: none last 24hrs  . Drug use: No  . Sexual activity: Yes  Other Topics Concern  . Not on file  Social History Narrative  . Not on file   Social Determinants of Health   Financial Resource Strain:   . Difficulty of Paying Living Expenses:   Food Insecurity:   . Worried About Charity fundraiser in the Last Year:   . Arboriculturist in the Last Year:   Transportation Needs:   . Film/video editor (Medical):   Marland Kitchen Lack  of Transportation (Non-Medical):   Physical Activity:   . Days of Exercise per Week:   . Minutes of Exercise per Session:   Stress:   . Feeling of Stress :   Social Connections:   . Frequency of Communication with Friends and Family:   . Frequency of Social Gatherings with Friends and Family:   . Attends Religious Services:   . Active Member of Clubs or Organizations:   . Attends Archivist Meetings:   Marland Kitchen Marital Status:   Intimate Partner Violence:   . Fear of Current or Ex-Partner:   . Emotionally Abused:   Marland Kitchen Physically Abused:   . Sexually Abused:     Past Medical History:  Diagnosis Date  . Allergy   . Arthritis   . Hypertension      Patient Active Problem List   Diagnosis Date Noted  . Eczema 04/13/2018  . Irritable bowel syndrome with diarrhea 04/13/2018  . Prediabetes 04/13/2018  . PMB (postmenopausal bleeding) 10/05/2017  . Gastro-esophageal reflux disease with esophagitis 05/13/2017  . Strain of muscle of right hip 05/06/2017  . Bilateral hearing loss 04/12/2017  . Scoliosis of lumbosacral spine 03/30/2017  . Abnormality of gait and mobility 03/30/2017  . Allergic rhinitis 09/08/2015  . Hypertension 09/08/2015  . H/O infectious disease 09/08/2015  . Gonalgia 09/08/2015  . RAD (reactive airway disease) 09/08/2015  . Morbid obesity ([place]) 03/12/2015  . Current tear of meniscus 01/02/2015  . Tear of meniscus of knee 01/02/2015    Past Surgical  History:  Procedure Laterality Date  . CESAREAN SECTION    . COLONOSCOPY WITH PROPOFOL N/A 05/11/2017   Procedure: COLONOSCOPY WITH PROPOFOL;  Surgeon: Robert Bellow, MD;  Location: [facility] END[facility]COPY;  Service: Endoscopy;  Laterality: N/A;  . DILATATION & CURETTAGE/HYSTEROSCOPY WITH MYOSURE N/A 02/27/2016   Procedure: DILATATION & CURETTAGE/HYSTEROSCOPY WITH MYOSURE;  Surgeon: Boykin Nearing, MD;  Location: [facility] ORS;  Service: Gynecology;  Laterality: N/A;  . ESOPHAGOGASTRODUODENOSCOPY (EGD) WITH  PROPOFOL N/A 05/11/2017   Procedure: ESOPHAGOGASTRODUODENOSCOPY (EGD) WITH PROPOFOL;  Surgeon: Robert Bellow, MD;  Location: [facility] ENDOSCOPY;  Service: Endoscopy;  Laterality: N/A;  . sebaceous syst removed  08/24/2011  . tonsillectomy    . TONSILLECTOMY      Her family history includes Cerebral palsy in her son; Diabetes in her father and mother; Hypertension in her mother. There is no history of Breast cancer.   Current Outpatient Medications:  .  triamterene-hydrochlorothiazide (MAXZIDE-25) 37.5-25 MG tablet, TK 1 T PO D, Disp: , Rfl:  .  Cholecalciferol (D 2000) 2000 units TABS, Take 1 tablet by mouth daily., Disp: , Rfl:  .  clobetasol ointment (TEMOVATE) 0.05 %, APP TOPICALLY TO ROUGH SPOTS ON THE LEGS BID UNTIL SMOOTH, Disp: , Rfl:  .  fluticasone (FLONASE) 50 MCG/ACT nasal spray, Place 2 sprays into both nostrils as needed., Disp: 16 g, Rfl: 0 .  hydrochlorothiazide (HYDRODIURIL) 25 MG tablet, Take 1 tablet (25 mg total) by mouth daily., Disp: 90 tablet, Rfl: 1 .  Multiple Vitamin (MULTI-VITAMINS) TABS, Take 1 tablet by mouth daily., Disp: , Rfl:  .  neomycin-polymyxin b-dexamethasone (MAXITROL) 3.5-10000-0.1 SUSP, Place 1 drop into the right eye 4 (four) times daily., Disp: , Rfl:  .  triamcinolone ointment (KENALOG) 0.5 %, Apply 1 application topically 2 (two) times daily., Disp: 60 g, Rfl: 2 .  valACYclovir (VALTREX) 1000 MG tablet, TAKE 2 TABLETS TWICE DAILY AS NEEDED, Disp: 30 tablet, Rfl: 5  Patient Care Team: Virginia Crews, MD as PCP - General (Family Medicine)     Objective:    Vitals: BP (!) 141/81   Pulse 69   Temp (!) 96.6 F (35.9 C) (Temporal)   Resp 16   Ht 5\' 7"  (1.702 m)   Wt 252 lb (114.3 kg)   BMI 39.47 kg/m   Physical Exam  Activities of Daily Living In your present state of health, do you have any difficulty performing the following activities: 04/16/2019  Hearing? Y  Comment Wear hearing aids  Vision? Y  Comment Wear glasses  Difficulty  concentrating or making decisions? N  Walking or climbing stairs? N  Dressing or bathing? N  Doing errands, shopping? N  Some recent data might be hidden    Fall Risk Assessment Fall Risk  04/16/2019 04/13/2018 04/12/2017 09/15/2015  Falls in the past year? 0 No No No  Number falls in past yr: 0 - - -  Injury with Fall? 0 - - -     Depression Screen PHQ 2/9 Scores 04/16/2019 04/13/2018 04/12/2017 09/15/2015  PHQ - 2 Score 0 0 0 -  PHQ- 9 Score 0 0 - -  Exception Documentation - - - Patient refusal    6CIT Screen 04/16/2019  What Year? 0 points  What month? 0 points  What time? 0 points  Count back from 20 0 points  Months in reverse 0 points  Repeat phrase 2 points  Total Score 2       Assessment & Plan:    Annual  Physical Reviewed patient's Family Medical History Reviewed and updated list of patient's medical providers Assessment of cognitive impairment was done Assessed patient's functional ability Established a written schedule for health screening [place] Completed and Reviewed  Exercise Activities and Dietary recommendations Goals   None     Immunization History  Administered Date(s) Administered  . Fluad Quad(high Dose 65+) 05/23/2019  . Influenza,inj,Quad PF,6+ Mos 06/30/2017  . Influenza-Unspecified 05/17/2015  . Pneumococcal Polysaccharide-23 04/16/2019  . Td 04/16/2019  . Tdap 04/29/2009  . Zoster Recombinat (Shingrix) 04/12/2017, 10/12/2017    Health Maintenance  Topic Date Due  . INFLUENZA VACCINE  03/16/2020  . PNA vac Low Risk Adult (2 of 2 - PCV13) 04/15/2020  . MAMMOGRAM  06/27/2021  . COLONOSCOPY  05/11/2022  . TETANUS/TDAP  04/15/2029  . DEXA SCAN  Completed  . Hepatitis C Screening  Completed     Discussed health benefits of physical activity, and encouraged her to engage in regular exercise appropriate for her age and condition.     ------------------------------------------------------------------------------------------------------------    Lavon Paganini, MD  [place]

## 2019-11-26 NOTE — Patient Instructions (Signed)
Preventive Care 67 Years and Older, Female Preventive care refers to lifestyle choices and visits with your health care provider that can promote health and wellness. This includes:  A yearly physical exam. This is also called an annual well check.  Regular dental and eye exams.  Immunizations.  Screening for certain conditions.  Healthy lifestyle choices, such as diet and exercise. What can I expect for my preventive care visit? Physical exam Your health care provider will check:  Height and weight. These may be used to calculate body mass index (BMI), which is a measurement that tells if you are at a healthy weight.  Heart rate and blood pressure.  Your skin for abnormal spots. Counseling Your health care provider may ask you questions about:  Alcohol, tobacco, and drug use.  Emotional well-being.  Home and relationship well-being.  Sexual activity.  Eating habits.  History of falls.  Memory and ability to understand (cognition).  Work and work Statistician.  Pregnancy and menstrual history. What immunizations do I need?  Influenza (flu) vaccine  This is recommended every year. Tetanus, diphtheria, and pertussis (Tdap) vaccine  You may need a Td booster every 10 years. Varicella (chickenpox) vaccine  You may need this vaccine if you have not already been vaccinated. Zoster (shingles) vaccine  You may need this after age 33. Pneumococcal conjugate (PCV13) vaccine  One dose is recommended after age 33. Pneumococcal polysaccharide (PPSV23) vaccine  One dose is recommended after age 72. Measles, mumps, and rubella (MMR) vaccine  You may need at least one dose of MMR if you were born in 1957 or later. You may also need a second dose. Meningococcal conjugate (MenACWY) vaccine  You may need this if you have certain conditions. Hepatitis A vaccine  You may need this if you have certain conditions or if you travel or work in places where you may be exposed  to hepatitis A. Hepatitis B vaccine  You may need this if you have certain conditions or if you travel or work in places where you may be exposed to hepatitis B. Haemophilus influenzae type b (Hib) vaccine  You may need this if you have certain conditions. You may receive vaccines as individual doses or as more than one vaccine together in one shot (combination vaccines). Talk with your health care provider about the risks and benefits of combination vaccines. What tests do I need? Blood tests  Lipid and cholesterol levels. These may be checked every 5 years, or more frequently depending on your overall health.  Hepatitis C test.  Hepatitis B test. Screening  Lung cancer screening. You may have this screening every year starting at age 39 if you have a 30-pack-year history of smoking and currently smoke or have quit within the past 15 years.  Colorectal cancer screening. All adults should have this screening starting at age 36 and continuing until age 15. Your health care provider may recommend screening at age 23 if you are at increased risk. You will have tests every 1-10 years, depending on your results and the type of screening test.  Diabetes screening. This is done by checking your blood sugar (glucose) after you have not eaten for a while (fasting). You may have this done every 1-3 years.  Mammogram. This may be done every 1-2 years. Talk with your health care provider about how often you should have regular mammograms.  BRCA-related cancer screening. This may be done if you have a family history of breast, ovarian, tubal, or peritoneal cancers.  Other tests  Sexually transmitted disease (STD) testing.  Bone density scan. This is done to screen for osteoporosis. You may have this done starting at age 44. Follow these instructions at home: Eating and drinking  Eat a diet that includes fresh fruits and vegetables, whole grains, lean protein, and low-fat dairy products. Limit  your intake of foods with high amounts of sugar, saturated fats, and salt.  Take vitamin and mineral supplements as recommended by your health care provider.  Do not drink alcohol if your health care provider tells you not to drink.  If you drink alcohol: ? Limit how much you have to 0-1 drink a day. ? Be aware of how much alcohol is in your drink. In the [place] one drink equals one 12 oz bottle of beer (355 mL), one 5 oz glass of wine (148 mL), or one 1 oz glass of hard liquor (44 mL). Lifestyle  Take daily care of your teeth and gums.  Stay active. Exercise for at least 30 minutes on 5 or more days each week.  Do not use any products that contain nicotine or tobacco, such as cigarettes, e-cigarettes, and chewing tobacco. If you need help quitting, ask your health care provider.  If you are sexually active, practice safe sex. Use a condom or other form of protection in order to prevent STIs (sexually transmitted infections).  Talk with your health care provider about taking a low-dose aspirin or statin. What's next?  Go to your health care provider once a year for a well check visit.  Ask your health care provider how often you should have your eyes and teeth checked.  Stay up to date on all vaccines. This information is not intended to replace advice given to you by your health care provider. Make sure you discuss any questions you have with your health care provider. Document Revised: 07/27/2018 Document Reviewed: 07/27/2018 Elsevier Patient Education  2020 [employer].

## 2019-11-26 NOTE — Progress Notes (Signed)
Patient: Rita Bailey, Female    DOB: 1950-10-06, 69 y.o.   MRN: [id number] Visit Date: 11/26/2019  Today's Provider: Lavon Paganini, MD   Chief Complaint  Patient presents with  . Medicare Physical   Subjective:    Initial preventative physical exam Rita Bailey is a 69 y.o. female who presents today for her Initial Preventative Physical Exam. She feels fairly well. She reports exercising some. She reports she is sleeping fairly well.  ----------------------------------------------------------- 1st Pneumo 23 : 04/16/2019 Colonoscopy : 05/11/2017 Two 6 mm polyps in the sigmoid colon and in the descending colon. Biopsied./Diverticulosis Mammogram: 06/29/2019 BI-RADS 1 Negative  Review of Systems  Constitutional: Negative.   HENT: Negative.   Eyes: Negative.   Respiratory: Negative.   Cardiovascular: Negative.   Gastrointestinal: Negative.   Endocrine: Negative.   Genitourinary: Negative.   Musculoskeletal: Negative.   Skin: Positive for wound.  Allergic/Immunologic: Negative.   Neurological: Negative.   Hematological: Negative.   Psychiatric/Behavioral: Negative.     Social History   Socioeconomic History  . Marital status: Married    Spouse name: Dorothyann Peng  . Number of children: 1  . Years of education: Bachelors  . Highest education level: Not on file  Occupational History    Employer: [employer]  Tobacco Use  . Smoking status: Never Smoker  . Smokeless tobacco: Never Used  Substance and Sexual Activity  . Alcohol use: Yes    Comment: none last 24hrs  . Drug use: No  . Sexual activity: Yes  Other Topics Concern  . Not on file  Social History Narrative  . Not on file   Social Determinants of Health   Financial Resource Strain:   . Difficulty of Paying Living Expenses:   Food Insecurity:   . Worried About Charity fundraiser in the Last Year:   . Arboriculturist in the Last Year:   Transportation Needs:   . Film/video editor  (Medical):   Marland Kitchen Lack of Transportation (Non-Medical):   Physical Activity:   . Days of Exercise per Week:   . Minutes of Exercise per Session:   Stress:   . Feeling of Stress :   Social Connections:   . Frequency of Communication with Friends and Family:   . Frequency of Social Gatherings with Friends and Family:   . Attends Religious Services:   . Active Member of Clubs or Organizations:   . Attends Archivist Meetings:   Marland Kitchen Marital Status:   Intimate Partner Violence:   . Fear of Current or Ex-Partner:   . Emotionally Abused:   Marland Kitchen Physically Abused:   . Sexually Abused:     Past Medical History:  Diagnosis Date  . Allergy   . Arthritis   . Hypertension      Patient Active Problem List   Diagnosis Date Noted  . Eczema 04/13/2018  . Irritable bowel syndrome with diarrhea 04/13/2018  . Prediabetes 04/13/2018  . PMB (postmenopausal bleeding) 10/05/2017  . Gastro-esophageal reflux disease with esophagitis 05/13/2017  . Strain of muscle of right hip 05/06/2017  . Bilateral hearing loss 04/12/2017  . Scoliosis of lumbosacral spine 03/30/2017  . Abnormality of gait and mobility 03/30/2017  . Allergic rhinitis 09/08/2015  . Hypertension 09/08/2015  . H/O infectious disease 09/08/2015  . Gonalgia 09/08/2015  . RAD (reactive airway disease) 09/08/2015  . Morbid obesity ([place]) 03/12/2015  . Current tear of meniscus 01/02/2015  . Tear of meniscus of knee 01/02/2015  Past Surgical History:  Procedure Laterality Date  . CESAREAN SECTION    . COLONOSCOPY WITH PROPOFOL N/A 05/11/2017   Procedure: COLONOSCOPY WITH PROPOFOL;  Surgeon: Robert Bellow, MD;  Location: [facility] ENDOSCOPY;  Service: Endoscopy;  Laterality: N/A;  . DILATATION & CURETTAGE/HYSTEROSCOPY WITH MYOSURE N/A 02/27/2016   Procedure: DILATATION & CURETTAGE/HYSTEROSCOPY WITH MYOSURE;  Surgeon: Boykin Nearing, MD;  Location: [facility] ORS;  Service: Gynecology;  Laterality: N/A;  .  ESOPHAGOGASTRODUODENOSCOPY (EGD) WITH PROPOFOL N/A 05/11/2017   Procedure: ESOPHAGOGASTRODUODENOSCOPY (EGD) WITH PROPOFOL;  Surgeon: Robert Bellow, MD;  Location: [facility] ENDOSCOPY;  Service: Endoscopy;  Laterality: N/A;  . sebaceous syst removed  08/24/2011  . tonsillectomy    . TONSILLECTOMY      Her family history includes Cerebral palsy in her son; Diabetes in her father and mother; Hypertension in her mother. There is no history of Breast cancer.   Current Outpatient Medications:  .  triamterene-hydrochlorothiazide (MAXZIDE-25) 37.5-25 MG tablet, TK 1 T PO D, Disp: , Rfl:  .  Cholecalciferol (D 2000) 2000 units TABS, Take 1 tablet by mouth daily., Disp: , Rfl:  .  clobetasol ointment (TEMOVATE) 0.05 %, APP TOPICALLY TO ROUGH SPOTS ON THE LEGS BID UNTIL SMOOTH, Disp: , Rfl:  .  fluticasone (FLONASE) 50 MCG/ACT nasal spray, Place 2 sprays into both nostrils as needed., Disp: 16 g, Rfl: 0 .  hydrochlorothiazide (HYDRODIURIL) 25 MG tablet, Take 1 tablet (25 mg total) by mouth daily., Disp: 90 tablet, Rfl: 1 .  Multiple Vitamin (MULTI-VITAMINS) TABS, Take 1 tablet by mouth daily., Disp: , Rfl:  .  neomycin-polymyxin b-dexamethasone (MAXITROL) 3.5-10000-0.1 SUSP, Place 1 drop into the right eye 4 (four) times daily., Disp: , Rfl:  .  triamcinolone ointment (KENALOG) 0.5 %, Apply 1 application topically 2 (two) times daily., Disp: 60 g, Rfl: 2 .  valACYclovir (VALTREX) 1000 MG tablet, TAKE 2 TABLETS TWICE DAILY AS NEEDED, Disp: 30 tablet, Rfl: 5   Patient Care Team: Virginia Crews, MD as PCP - General (Family Medicine)   Objective:    Vitals: BP 140/82 (BP Location: Left Arm)   Pulse 72   Temp (!) 96.6 F (35.9 C) (Temporal)   Resp 16   Ht 5\' 7"  (1.702 m)   Wt 252 lb (114.3 kg)   BMI 39.47 kg/m   Physical Exam Vitals reviewed.  Constitutional:      General: She is not in acute distress.    Appearance: Normal appearance. She is well-developed. She is not diaphoretic.  HENT:      Head: Normocephalic and atraumatic.  Eyes:     General: No scleral icterus.    Conjunctiva/sclera: Conjunctivae normal.     Pupils: Pupils are equal, round, and reactive to light.  Neck:     Thyroid: No thyromegaly.  Cardiovascular:     Rate and Rhythm: Normal rate and regular rhythm.     Heart sounds: Normal heart sounds. No murmur.  Pulmonary:     Effort: Pulmonary effort is normal. No respiratory distress.     Breath sounds: Normal breath sounds. No wheezing or rales.  Abdominal:     General: There is no distension.     Palpations: Abdomen is soft.     Tenderness: There is no abdominal tenderness. There is no guarding or rebound.  Musculoskeletal:        General: No deformity.     Cervical back: Neck supple.     Right lower leg: No edema.  Left lower leg: No edema.  Lymphadenopathy:     Cervical: No cervical adenopathy.  Skin:    General: Skin is warm and dry.     Findings: No rash.  Neurological:     Mental Status: She is alert and oriented to person, place, and time. Mental status is at baseline.  Psychiatric:        Mood and Affect: Mood normal.        Behavior: Behavior normal.        Thought Content: Thought content normal.     Hearing screen declines as she sees audiology  Hearing Screening   125Hz  250Hz  500Hz  1000Hz  2000Hz  3000Hz  4000Hz  6000Hz  8000Hz   Right ear:           Left ear:             Visual Acuity Screening   Right eye Left eye Both eyes  Without correction:     With correction: 20/205 20/25 20/15    Activities of Daily Living In your present state of health, do you have any difficulty performing the following activities: 11/26/2019 04/16/2019  Hearing? Y Y  Comment Has Hearing Aids Wear hearing aids  Vision? N Y  Comment Wears Glasses Wear glasses  Difficulty concentrating or making decisions? N N  Walking or climbing stairs? N N  Dressing or bathing? N N  Doing errands, shopping? N N  Some recent data might be hidden    Fall  Risk Assessment Fall Risk  11/26/2019 04/16/2019 04/13/2018 04/12/2017 09/15/2015  Falls in the past year? 0 0 No No No  Number falls in past yr: 0 0 - - -  Injury with Fall? 0 0 - - -     Depression Screen PHQ 2/9 Scores 11/26/2019 04/16/2019 04/13/2018 04/12/2017  PHQ - 2 Score 0 0 0 0  PHQ- 9 Score 0 0 0 -  Exception Documentation - - - -    6CIT Screen 04/16/2019  What Year? 0 points  What month? 0 points  What time? 0 points  Count back from 20 0 points  Months in reverse 0 points  Repeat phrase 2 points  Total Score 2      Assessment & Plan:     Initial Preventative Physical Exam  Reviewed patient's Family Medical History Reviewed and updated list of patient's medical providers Assessment of cognitive impairment was done Assessed patient's functional ability Established a written schedule for health screening [place] Completed and Reviewed  Exercise Activities and Dietary recommendations Goals   None     Immunization History  Administered Date(s) Administered  . Fluad Quad(high Dose 65+) 05/23/2019  . Influenza,inj,Quad PF,6+ Mos 06/30/2017  . Influenza-Unspecified 05/17/2015  . Pneumococcal Polysaccharide-23 04/16/2019  . Td 04/16/2019  . Tdap 04/29/2009  . Zoster Recombinat (Shingrix) 04/12/2017, 10/12/2017    Health Maintenance  Topic Date Due  . INFLUENZA VACCINE  03/16/2020  . PNA vac Low Risk Adult (2 of 2 - PCV13) 04/15/2020  . MAMMOGRAM  06/27/2021  . COLONOSCOPY  05/11/2022  . TETANUS/TDAP  04/15/2029  . DEXA SCAN  Completed  . Hepatitis C Screening  Completed     Discussed health benefits of physical activity, and encouraged her to engage in regular exercise appropriate for her age and condition.    ------------------------------------------------------------------------------------------------------------  The entirety of the information documented in the History of Present Illness, Review of Systems and Physical Exam  were personally obtained by me. Portions of this information were initially documented  by Judie Petit , CMA and reviewed by me for thoroughness and accuracy.     Lavon Paganini, MD  [facility]

## 2020-02-25 ENCOUNTER — Ambulatory Visit: Payer: Self-pay | Admitting: Family Medicine

## 2020-02-29 ENCOUNTER — Ambulatory Visit: Payer: Self-pay | Admitting: Family Medicine

## 2020-03-20 ENCOUNTER — Telehealth: Payer: Self-pay

## 2020-03-20 NOTE — Telephone Encounter (Signed)
Copied from [place] [id number]. Topic: Complaint - Billing/Coding >> Feb 11, 2020 11:24 AM Erick Blinks wrote: Reason for CRM: Pt called back and needs a call back from management regarding billing complaint  Best contact: [phone] >> Mar 20, 2020 10:16 AM Sheran Luz wrote: Patient calling back about this request. She states she needs to hear back today as she has received another bill.

## 2020-03-20 NOTE — Telephone Encounter (Signed)
Copied from [place] [id number]. Topic: Complaint - Billing/Coding >> Feb 11, 2020 11:24 AM Erick Blinks wrote: Reason for CRM: Pt called back and needs a call back from management regarding billing complaint  Best contact: [phone] >> Mar 20, 2020 10:16 AM Sheran Luz wrote: Patient calling back about this request. She states she needs to hear back today as she has received another bill.

## 2020-03-21 NOTE — Telephone Encounter (Signed)
Pt called today regarding her EKG that was done during her AWV with Dr. Jacinto Reap. on 11-26-2019. She says she is getting threatining letters regarding her bill - collections. She as been calling and asking for a call back for the past 2 months. She has called [facility] and was told it's a coding issue by her provider's office. She says nothing was discussed for her to have the EKG requested by Dr. Jacinto Reap. She wants to know what needs to be done. She also asked if billing could be called to let them know that she's been waiting on our office for help with resolving this billing issue.  Thanks, [employer]

## 2020-04-02 ENCOUNTER — Ambulatory Visit: Payer: Self-pay | Admitting: Family Medicine

## 2020-04-02 ENCOUNTER — Other Ambulatory Visit: Payer: Self-pay

## 2020-04-02 ENCOUNTER — Ambulatory Visit (INDEPENDENT_AMBULATORY_CARE_PROVIDER_SITE_OTHER): Payer: Medicare Other | Admitting: Family Medicine

## 2020-04-02 ENCOUNTER — Encounter: Payer: Self-pay | Admitting: Family Medicine

## 2020-04-02 VITALS — BP 149/84 | HR 69 | Temp 99.0°F | Resp 16 | Ht 67.0 in | Wt 248.0 lb

## 2020-04-02 DIAGNOSIS — I10 Essential (primary) hypertension: Secondary | ICD-10-CM | POA: Diagnosis not present

## 2020-04-02 DIAGNOSIS — R7303 Prediabetes: Secondary | ICD-10-CM

## 2020-04-02 MED ORDER — HYDROCHLOROTHIAZIDE 25 MG PO TABS: 25.0000 mg | ORAL_TABLET | Freq: Every day | ORAL | 1 refills | Status: DC

## 2020-04-02 NOTE — Assessment & Plan Note (Signed)
Discussed importance of healthy weight management Discussed diet and exercise  

## 2020-04-02 NOTE — Progress Notes (Signed)
Established patient visit   Patient: Rita Bailey   DOB: 1950/11/02   69 y.o. Female  MRN: [id number] Visit Date: 04/02/2020  Today's healthcare provider: Lavon Paganini, MD   I,[name],acting as a scribe for Lavon Paganini, MD.,have documented all relevant documentation on the behalf of Lavon Paganini, MD,as directed by  Lavon Paganini, MD while in the presence of Lavon Paganini, MD.  Chief Complaint  Patient presents with  . Hypertension   Subjective    HPI  Hypertension, follow-up  BP Readings from Last 3 Encounters:  04/02/20 (!) 149/84  11/26/19 140/82  04/16/19 127/86   Wt Readings from Last 3 Encounters:  04/02/20 248 lb (112.5 kg)  11/26/19 252 lb (114.3 kg)  04/16/19 254 lb 12.8 oz (115.6 kg)     She was last seen for hypertension 4 months ago.  BP at that visit was 140/82. Management since that visit includes no changes.  She reports excellent compliance with treatment. She is not having side effects.  She is following a Regular diet. She is not exercising. She does not smoke.  Use of agents associated with hypertension: none.   Outside blood pressures are stable. Symptoms: No chest pain No chest pressure  No palpitations No syncope  No dyspnea No orthopnea  No paroxysmal nocturnal dyspnea No lower extremity edema   Pertinent labs: Lab Results  Component Value Date   CHOL 185 04/16/2019   HDL 62 04/16/2019   LDLCALC 103 (H) 04/16/2019   TRIG 112 04/16/2019   CHOLHDL 3.0 04/16/2019   Lab Results  Component Value Date   NA 141 04/16/2019   K 3.6 04/16/2019   CREATININE 1.21 (H) 04/16/2019   GFRNONAA 47 (L) 04/16/2019   GFRAA 54 (L) 04/16/2019   GLUCOSE 103 (H) 04/16/2019     The 10-year ASCVD risk score Mikey Bussing DC Jr., et al., 2013) is: 10.2%   ---------------------------------------------------------------------------------------------------   Patient Active Problem List   Diagnosis Date Noted  . Eczema  04/13/2018  . Irritable bowel syndrome with diarrhea 04/13/2018  . Prediabetes 04/13/2018  . PMB (postmenopausal bleeding) 10/05/2017  . Gastro-esophageal reflux disease with esophagitis 05/13/2017  . Strain of muscle of right hip 05/06/2017  . Bilateral hearing loss 04/12/2017  . Scoliosis of lumbosacral spine 03/30/2017  . Abnormality of gait and mobility 03/30/2017  . Allergic rhinitis 09/08/2015  . Hypertension 09/08/2015  . H/O infectious disease 09/08/2015  . Gonalgia 09/08/2015  . RAD (reactive airway disease) 09/08/2015  . Morbid obesity ([place]) 03/12/2015  . Current tear of meniscus 01/02/2015  . Tear of meniscus of knee 01/02/2015   Social History   Tobacco Use  . Smoking status: Never Smoker  . Smokeless tobacco: Never Used  Vaping Use  . Vaping Use: Never used  Substance Use Topics  . Alcohol use: Yes    Comment: none last 24hrs  . Drug use: No   No Known Allergies     Medications: Outpatient Medications Prior to Visit  Medication Sig  . Cholecalciferol (D 2000) 2000 units TABS Take 1 tablet by mouth daily.  . fluticasone (FLONASE) 50 MCG/ACT nasal spray Place 2 sprays into both nostrils as needed.  . Multiple Vitamin (MULTI-VITAMINS) TABS Take 1 tablet by mouth daily.  . valACYclovir (VALTREX) 1000 MG tablet TAKE 2 TABLETS TWICE DAILY AS NEEDED  . [DISCONTINUED] hydrochlorothiazide (HYDRODIURIL) 25 MG tablet Take 1 tablet (25 mg total) by mouth daily.  . [DISCONTINUED] clobetasol ointment (TEMOVATE) 0.05 % APP TOPICALLY  TO ROUGH SPOTS ON THE LEGS BID UNTIL SMOOTH (Patient not taking: Reported on 04/02/2020)  . [DISCONTINUED] neomycin-polymyxin b-dexamethasone (MAXITROL) 3.5-10000-0.1 SUSP Place 1 drop into the right eye 4 (four) times daily. (Patient not taking: Reported on 04/02/2020)  . [DISCONTINUED] triamcinolone ointment (KENALOG) 0.5 % Apply 1 application topically 2 (two) times daily. (Patient not taking: Reported on 04/02/2020)  . [DISCONTINUED]  triamterene-hydrochlorothiazide (MAXZIDE-25) 37.5-25 MG tablet TK 1 T PO D (Patient not taking: Reported on 04/02/2020)   No facility-administered medications prior to visit.    Review of Systems  Constitutional: Negative.   Respiratory: Negative.   Cardiovascular: Negative.       Objective    BP (!) 149/84 (BP Location: Left Arm, Patient Position: Sitting, Cuff Size: Large)   Pulse 69   Temp 99 F (37.2 C) (Oral)   Resp 16   Ht 5\' 7"  (1.702 m)   Wt 248 lb (112.5 kg)   BMI 38.84 kg/m  BP Readings from Last 3 Encounters:  04/02/20 (!) 149/84  11/26/19 140/82  04/16/19 127/86   Wt Readings from Last 3 Encounters:  04/02/20 248 lb (112.5 kg)  11/26/19 252 lb (114.3 kg)  04/16/19 254 lb 12.8 oz (115.6 kg)      Physical Exam Vitals reviewed.  Constitutional:      General: She is not in acute distress.    Appearance: Normal appearance. She is well-developed. She is not diaphoretic.  HENT:     Head: Normocephalic and atraumatic.  Eyes:     General: No scleral icterus.    Conjunctiva/sclera: Conjunctivae normal.  Neck:     Thyroid: No thyromegaly.  Cardiovascular:     Rate and Rhythm: Normal rate and regular rhythm.     Pulses: Normal pulses.     Heart sounds: Normal heart sounds. No murmur heard.   Pulmonary:     Effort: Pulmonary effort is normal. No respiratory distress.     Breath sounds: Normal breath sounds. No wheezing, rhonchi or rales.  Musculoskeletal:     Cervical back: Neck supple.     Right lower leg: No edema.     Left lower leg: No edema.  Lymphadenopathy:     Cervical: No cervical adenopathy.  Skin:    General: Skin is warm and dry.     Findings: No rash.  Neurological:     Mental Status: She is alert and oriented to person, place, and time. Mental status is at baseline.  Psychiatric:        Mood and Affect: Mood normal.        Behavior: Behavior normal.     No results found for any visits on 04/02/20.  Assessment & Plan     Problem  List Items Addressed This Visit      Cardiovascular and Mediastinum   Hypertension - Primary    Previously well controlled Elevated today (she suspects due to worry about labs today) Well controlled at home No changes to medications Continue to monitor at home  Continue current meds Recheck metabolic panel      Relevant Medications   hydrochlorothiazide (HYDRODIURIL) 25 MG tablet   Other Relevant Orders   Comprehensive metabolic panel   Lipid Panel With LDL/HDL Ratio     Other   Morbid obesity ([place])    Discussed importance of healthy weight management Discussed diet and exercise       Relevant Orders   Lipid Panel With LDL/HDL Ratio   Prediabetes    Encourage low carb  diet Recheck A1c      Relevant Orders   Hemoglobin A1c       Return in about 6 months (around 10/03/2020) for AWV, chronic disease f/u.      I, Lavon Paganini, MD, have reviewed all documentation for this visit. The documentation on 04/02/20 for the exam, diagnosis, procedures, and orders are all accurate and complete.   Neidy Guerrieri, Dionne Bucy, MD, MPH [facility]

## 2020-04-02 NOTE — Assessment & Plan Note (Signed)
Encourage low carb diet Recheck A1c 

## 2020-04-02 NOTE — Assessment & Plan Note (Signed)
Previously well controlled Elevated today (she suspects due to worry about labs today) Well controlled at home No changes to medications Continue to monitor at home  Continue current meds Recheck metabolic panel

## 2020-04-03 ENCOUNTER — Encounter: Payer: Self-pay | Admitting: Family Medicine

## 2020-04-03 MED ORDER — HYDROCHLOROTHIAZIDE 25 MG PO TABS: 25.0000 mg | ORAL_TABLET | Freq: Every day | ORAL | 1 refills | Status: DC

## 2020-04-16 ENCOUNTER — Telehealth: Payer: Self-pay

## 2020-04-16 LAB — COMPREHENSIVE METABOLIC PANEL
ALT: 18 IU/L (ref 0–32)
AST: 22 IU/L (ref 0–40)
Albumin/Globulin Ratio: 1.7 (ref 1.2–2.2)
Albumin: 4.3 g/dL (ref 3.8–4.8)
Alkaline Phosphatase: 63 IU/L (ref 48–121)
BUN/Creatinine Ratio: 20 (ref 12–28)
BUN: 18 mg/dL (ref 8–27)
Bilirubin Total: 0.7 mg/dL (ref 0.0–1.2)
CO2: 23 mmol/L (ref 20–29)
Calcium: 9.3 mg/dL (ref 8.7–10.3)
Chloride: 104 mmol/L (ref 96–106)
Creatinine, Ser: 0.88 mg/dL (ref 0.57–1.00)
GFR calc Af Amer: 79 mL/min/{1.73_m2} (ref >59)
GFR calc non Af Amer: 69 mL/min/{1.73_m2} (ref >59)
Globulin, Total: 2.5 g/dL (ref 1.5–4.5)
Glucose: 120 mg/dL — ABNORMAL HIGH (ref 65–99)
Potassium: 3.8 mmol/L (ref 3.5–5.2)
Sodium: 143 mmol/L (ref 134–144)
Total Protein: 6.8 g/dL (ref 6.0–8.5)

## 2020-04-16 LAB — LIPID PANEL WITH LDL/HDL RATIO
Cholesterol, Total: 175 mg/dL (ref 100–199)
HDL: 62 mg/dL (ref >39)
LDL Chol Calc (NIH): 93 mg/dL (ref 0–99)
LDL/HDL Ratio: 1.5 ratio (ref 0.0–3.2)
Triglycerides: 111 mg/dL (ref 0–149)
VLDL Cholesterol Cal: 20 mg/dL (ref 5–40)

## 2020-04-16 LAB — HEMOGLOBIN A1C
Est. average glucose Bld gHb Est-mCnc: 128 mg/dL
Hgb A1c MFr Bld: 6.1 % — ABNORMAL HIGH (ref 4.8–5.6)

## 2020-04-16 NOTE — Telephone Encounter (Signed)
LMTCB, P[facility] Triage Nurse may give patient results  

## 2020-04-16 NOTE — Telephone Encounter (Signed)
Seen by patient Rita Bailey on 04/16/2020  9:54 AM

## 2020-04-16 NOTE — Telephone Encounter (Signed)
-----   Message from Virginia Crews, MD sent at 04/16/2020  7:55 AM EDT ----- Normal labs.  Cholesterol has improved. A1c remains in prediabetic range.

## 2020-06-12 ENCOUNTER — Ambulatory Visit: Payer: Medicare Other

## 2020-06-17 ENCOUNTER — Other Ambulatory Visit: Payer: Self-pay | Admitting: Family Medicine

## 2020-06-17 DIAGNOSIS — Z1231 Encounter for screening mammogram for malignant neoplasm of breast: Secondary | ICD-10-CM

## 2020-07-07 ENCOUNTER — Other Ambulatory Visit: Payer: Self-pay

## 2020-07-07 ENCOUNTER — Ambulatory Visit
Admission: RE | Admit: 2020-07-07 | Discharge: 2020-07-07 | Disposition: A | Discharge status: Home / Self Care | Payer: Medicare Other | Source: Ambulatory Visit | Attending: Family Medicine | Admitting: Family Medicine

## 2020-07-07 DIAGNOSIS — Z1231 Encounter for screening mammogram for malignant neoplasm of breast: Secondary | ICD-10-CM | POA: Insufficient documentation

## 2020-09-16 ENCOUNTER — Telehealth: Payer: Self-pay

## 2020-09-16 NOTE — Telephone Encounter (Signed)
Patient's husband tested positive for Covid before his surgery.  He doesn't have symptoms.  Surgery was rescheduled.  She is asking if she needs to be tested. She is not having any symptoms.  Please advise.  Thanks, [employer]

## 2020-09-16 NOTE — Telephone Encounter (Signed)
If vaccinated, can technically avoid testing without symptoms.  To be extra cautious, can check on day 5 from exposure or just if develops symptoms.

## 2020-09-17 NOTE — Telephone Encounter (Signed)
Advised patient of recommendations.  Patient will call if needed

## 2020-11-18 ENCOUNTER — Encounter: Payer: Self-pay | Admitting: Family Medicine

## 2020-11-18 ENCOUNTER — Other Ambulatory Visit: Payer: Self-pay

## 2020-11-18 ENCOUNTER — Ambulatory Visit (INDEPENDENT_AMBULATORY_CARE_PROVIDER_SITE_OTHER): Payer: Medicare Other | Admitting: Family Medicine

## 2020-11-18 VITALS — BP 134/61 | HR 70 | Temp 98.2°F | Ht 67.0 in | Wt 249.6 lb

## 2020-11-18 DIAGNOSIS — Z23 Encounter for immunization: Secondary | ICD-10-CM

## 2020-11-18 DIAGNOSIS — Z0000 Encounter for general adult medical examination without abnormal findings: Secondary | ICD-10-CM | POA: Diagnosis not present

## 2020-11-18 DIAGNOSIS — R7303 Prediabetes: Secondary | ICD-10-CM

## 2020-11-18 DIAGNOSIS — I10 Essential (primary) hypertension: Secondary | ICD-10-CM

## 2020-11-18 MED ORDER — BUPROPION HCL ER (XL) 150 MG PO TB24: 150.0000 mg | ORAL_TABLET | Freq: Every day | ORAL | 0 refills | Status: DC

## 2020-11-18 MED ORDER — NALTREXONE HCL 50 MG PO TABS: 25.0000 mg | ORAL_TABLET | Freq: Every day | ORAL | 0 refills | Status: DC

## 2020-11-18 NOTE — Patient Instructions (Signed)
Preventive Care 68 Years and Older, Female Preventive care refers to lifestyle choices and visits with your health care provider that can promote health and wellness. This includes:  A yearly physical exam. This is also called an annual wellness visit.  Regular dental and eye exams.  Immunizations.  Screening for certain conditions.  Healthy lifestyle choices, such as: ? Eating a healthy diet. ? Getting regular exercise. ? Not using drugs or products that contain nicotine and tobacco. ? Limiting alcohol use. What can I expect for my preventive care visit? Physical exam Your health care provider will check your:  Height and weight. These may be used to calculate your BMI (body mass index). BMI is a measurement that tells if you are at a healthy weight.  Heart rate and blood pressure.  Body temperature.  Skin for abnormal spots. Counseling Your health care provider may ask you questions about your:  Past medical problems.  Family's medical history.  Alcohol, tobacco, and drug use.  Emotional well-being.  Home life and relationship well-being.  Sexual activity.  Diet, exercise, and sleep habits.  History of falls.  Memory and ability to understand (cognition).  Work and work Statistician.  Pregnancy and menstrual history.  Access to firearms. What immunizations do I need? Vaccines are usually given at various ages, according to a schedule. Your health care provider will recommend vaccines for you based on your age, medical history, and lifestyle or other factors, such as travel or where you work.   What tests do I need? Blood tests  Lipid and cholesterol levels. These may be checked every 5 years, or more often depending on your overall health.  Hepatitis C test.  Hepatitis B test. Screening  Lung cancer screening. You may have this screening every year starting at age 68 if you have a 30-pack-year history of smoking and currently smoke or have quit within  the past 15 years.  Colorectal cancer screening. ? All adults should have this screening starting at age 68 and continuing until age 68. ? Your health care provider may recommend screening at age 68 if you are at increased risk. ? You will have tests every 1-10 years, depending on your results and the type of screening test.  Diabetes screening. ? This is done by checking your blood sugar (glucose) after you have not eaten for a while (fasting). ? You may have this done every 1-3 years.  Mammogram. ? This may be done every 1-2 years. ? Talk with your health care provider about how often you should have regular mammograms.  Abdominal aortic aneurysm (AAA) screening. You may need this if you are a current or former smoker.  BRCA-related cancer screening. This may be done if you have a family history of breast, ovarian, tubal, or peritoneal cancers. Other tests  STD (sexually transmitted disease) testing, if you are at risk.  Bone density scan. This is done to screen for osteoporosis. You may have this done starting at age 68. Talk with your health care provider about your test results, treatment options, and if necessary, the need for more tests. Follow these instructions at home: Eating and drinking  Eat a diet that includes fresh fruits and vegetables, whole grains, lean protein, and low-fat dairy products. Limit your intake of foods with high amounts of sugar, saturated fats, and salt.  Take vitamin and mineral supplements as recommended by your health care provider.  Do not drink alcohol if your health care provider tells you not to drink.  If you drink alcohol: ? Limit how much you have to 0-1 drink a day. ? Be aware of how much alcohol is in your drink. In the [place] one drink equals one 12 oz bottle of beer (355 mL), one 5 oz glass of wine (148 mL), or one 1 oz glass of hard liquor (44 mL).   Lifestyle  Take daily care of your teeth and gums. Brush your teeth every morning  and night with fluoride toothpaste. Floss one time each day.  Stay active. Exercise for at least 30 minutes 5 or more days each week.  Do not use any products that contain nicotine or tobacco, such as cigarettes, e-cigarettes, and chewing tobacco. If you need help quitting, ask your health care provider.  Do not use drugs.  If you are sexually active, practice safe sex. Use a condom or other form of protection in order to prevent STIs (sexually transmitted infections).  Talk with your health care provider about taking a low-dose aspirin or statin.  Find healthy ways to cope with stress, such as: ? Meditation, yoga, or listening to music. ? Journaling. ? Talking to a trusted person. ? Spending time with friends and family. Safety  Always wear your seat belt while driving or riding in a vehicle.  Do not drive: ? If you have been drinking alcohol. Do not ride with someone who has been drinking. ? When you are tired or distracted. ? While texting.  Wear a helmet and other protective equipment during sports activities.  If you have firearms in your house, make sure you follow all gun safety procedures. What's next?  Visit your health care provider once a year for an annual wellness visit.  Ask your health care provider how often you should have your eyes and teeth checked.  Stay up to date on all vaccines. This information is not intended to replace advice given to you by your health care provider. Make sure you discuss any questions you have with your health care provider. Document Revised: 07/23/2020 Document Reviewed: 07/27/2018 Elsevier Patient Education  2021 Elsevier Inc.  

## 2020-11-18 NOTE — Progress Notes (Signed)
Annual Wellness Visit     Patient: Rita Bailey, Female    DOB: [birth year], 70 y.o.   MRN: [id number] Visit Date: 11/18/2020  Today's Provider: Lavon Paganini, MD   Chief Complaint  Patient presents with  . Annual Wellness Exam   I,Porsha C McClurkin,acting as a scribe for Lavon Paganini, MD.,have documented all relevant documentation on the behalf of Lavon Paganini, MD,as directed by  Lavon Paganini, MD while in the presence of Lavon Paganini, MD.  Subjective    Rita Bailey is a 70 y.o. female who presents today for her Annual Wellness Visit. She reports consuming a low fat and low sodium diet. Home exercise routine includes walking. She generally feels well. She reports sleeping well. She does have additional problems to discuss today.   HPI Hypertension, follow-up  BP Readings from Last 3 Encounters:  11/18/20 134/61  04/02/20 (!) 149/84  11/26/19 140/82   Wt Readings from Last 3 Encounters:  11/18/20 249 lb 9.6 oz (113.2 kg)  04/02/20 248 lb (112.5 kg)  11/26/19 252 lb (114.3 kg)     She was last seen for hypertension 7 months ago.  BP at that visit was 149/84. Management since that visit includes continue current medication.  She reports good compliance with treatment. She is not having side effects.  She is following a Low fat, Low Sodium diet. She is exercising. She does not smoke.  Use of agents associated with hypertension: none.   Outside blood pressures are arranging around 130's/60's-70's. Symptoms: No chest pain No chest pressure  No palpitations No syncope  No dyspnea No orthopnea  No paroxysmal nocturnal dyspnea Yes lower extremity edema   Pertinent labs: Lab Results  Component Value Date   CHOL 175 04/15/2020   HDL 62 04/15/2020   LDLCALC 93 04/15/2020   TRIG 111 04/15/2020   CHOLHDL 3.0 04/16/2019   Lab Results  Component Value Date   NA 143 04/15/2020   K 3.8 04/15/2020   CREATININE 0.88 04/15/2020    GFRNONAA 69 04/15/2020   GFRAA 79 04/15/2020   GLUCOSE 120 (H) 04/15/2020     The 10-year ASCVD risk score Mikey Bussing DC Jr., et al., 2013) is: 9.1%   ---------------------------------------------------------------------------------------------------   Social History   Tobacco Use  . Smoking status: Never Smoker  . Smokeless tobacco: Never Used  Vaping Use  . Vaping Use: Never used  Substance Use Topics  . Alcohol use: Yes    Comment: none last 24hrs  . Drug use: No       Medications: Outpatient Medications Prior to Visit  Medication Sig  . Cholecalciferol 50 MCG (2000 UT) TABS Take 1 tablet by mouth daily.  . fluticasone (FLONASE) 50 MCG/ACT nasal spray Place 2 sprays into both nostrils as needed.  . hydrochlorothiazide (HYDRODIURIL) 25 MG tablet Take 1 tablet (25 mg total) by mouth daily.  . Multiple Vitamin (MULTI-VITAMINS) TABS Take 1 tablet by mouth daily.  . valACYclovir (VALTREX) 1000 MG tablet TAKE 2 TABLETS TWICE DAILY AS NEEDED   No facility-administered medications prior to visit.    No Known Allergies  Patient Care Team: Virginia Crews, MD as PCP - General (Family Medicine)  Review of Systems      Objective    Vitals: BP 134/61 Comment: home reading  Pulse 70   Temp 98.2 F (36.8 C) (Oral)   Ht 5\' 7"  (1.702 m)   Wt 249 lb 9.6 oz (113.2 kg)   SpO2 97%  BMI 39.09 kg/m    Physical Exam Vitals reviewed.  Constitutional:      General: She is not in acute distress.    Appearance: Normal appearance. She is well-developed. She is not diaphoretic.  HENT:     Head: Normocephalic and atraumatic.     Right Ear: Tympanic membrane, ear canal and external ear normal.     Left Ear: Tympanic membrane, ear canal and external ear normal.  Eyes:     General: No scleral icterus.    Conjunctiva/sclera: Conjunctivae normal.     Pupils: Pupils are equal, round, and reactive to light.  Neck:     Thyroid: No thyromegaly.  Cardiovascular:     Rate and  Rhythm: Normal rate and regular rhythm.     Pulses: Normal pulses.     Heart sounds: Normal heart sounds. No murmur heard.   Pulmonary:     Effort: Pulmonary effort is normal. No respiratory distress.     Breath sounds: Normal breath sounds. No wheezing or rales.  Abdominal:     General: There is no distension.     Palpations: Abdomen is soft.     Tenderness: There is no abdominal tenderness.  Musculoskeletal:        General: No deformity.     Cervical back: Neck supple.     Right lower leg: No edema.     Left lower leg: No edema.  Lymphadenopathy:     Cervical: No cervical adenopathy.  Skin:    General: Skin is warm and dry.     Findings: No rash.  Neurological:     Mental Status: She is alert and oriented to person, place, and time. Mental status is at baseline.     Gait: Gait abnormal (trendelenburg).  Psychiatric:        Mood and Affect: Mood normal.        Behavior: Behavior normal.        Thought Content: Thought content normal.      Most recent functional status assessment: In your present state of health, do you have any difficulty performing the following activities: 11/18/2020  Hearing? Y  Comment -  Vision? N  Comment -  Difficulty concentrating or making decisions? N  Walking or climbing stairs? N  Dressing or bathing? N  Doing errands, shopping? N  Some recent data might be hidden   Most recent fall risk assessment: Fall Risk  11/18/2020  Falls in the past year? 0  Number falls in past yr: 0  Injury with Fall? 0  Risk for fall due to : No Fall Risks  Follow up Falls evaluation completed    Most recent depression screenings: PHQ 2/9 Scores 11/18/2020 11/26/2019  PHQ - 2 Score 0 0  PHQ- 9 Score 0 0  Exception Documentation - -   Most recent cognitive screening: 6CIT Screen 04/16/2019  What Year? 0 points  What month? 0 points  What time? 0 points  Count back from 20 0 points  Months in reverse 0 points  Repeat phrase 2 points  Total Score 2    Most recent Audit-C alcohol use screening Alcohol Use Disorder Test (AUDIT) 11/18/2020  1. How often do you have a drink containing alcohol? 1  2. How many drinks containing alcohol do you have on a typical day when you are drinking? 0  3. How often do you have six or more drinks on one occasion? 0  AUDIT-C Score 1  Alcohol Brief Interventions/Follow-up -   A  score of 3 or more in women, and 4 or more in men indicates increased risk for alcohol abuse, EXCEPT if all of the points are from question 1   No results found for any visits on 11/18/20.  Assessment & Plan     Annual wellness visit done today including the all of the following: Reviewed patient's Family Medical History Reviewed and updated list of patient's medical providers Assessment of cognitive impairment was done Assessed patient's functional ability Established a written schedule for health screening [place] Completed and Reviewed  Exercise Activities and Dietary recommendations Goals   None     Immunization History  Administered Date(s) Administered  . Fluad Quad(high Dose 65+) 05/23/2019  . Influenza, High Dose Seasonal PF 06/04/2020  . Influenza,inj,Quad PF,6+ Mos 06/30/2017  . Influenza-Unspecified 05/17/2015  . Moderna Sars-Covid-2 Vaccination 10/10/2019, 11/09/2019, 08/13/2020  . Pneumococcal Polysaccharide-23 04/16/2019  . Td 04/16/2019  . Tdap 04/29/2009  . Zoster Recombinat (Shingrix) 04/12/2017, 10/12/2017    Health Maintenance  Topic Date Due  . PNA vac Low Risk Adult (2 of 2 - PCV13) 04/15/2020  . COVID-19 Vaccine (4 - Booster for Moderna series) 02/11/2021  . INFLUENZA VACCINE  03/16/2021  . COLONOSCOPY (Pts 45-33yrs Insurance coverage will need to be confirmed)  05/11/2022  . MAMMOGRAM  07/07/2022  . TETANUS/TDAP  04/15/2029  . DEXA SCAN  Completed  . Hepatitis C Screening  Completed  . HPV VACCINES  Aged Out     Discussed health benefits of physical activity,  and encouraged her to engage in regular exercise appropriate for her age and condition.   Problem List Items Addressed This Visit      Cardiovascular and Mediastinum   Hypertension    Well controlled Continue current medications Recheck metabolic panel F/u in 6 months       Relevant Orders   Comprehensive metabolic panel     Other   Morbid obesity ([place])    BMI 39 and assoc with HTN, prediabetes and GERD Discussed importance of healthy weight management Discussed diet and exercise  Patient would like to try Contrave for weight loss - will try generic alternative of naltrexone and wellbutrin      Relevant Orders   Comprehensive metabolic panel   Hemoglobin A1c   Prediabetes    Recommend low carb diet Recheck A1c       Relevant Orders   Hemoglobin A1c    Other Visit Diagnoses    Encounter for annual wellness visit (AWV) in Medicare patient    -  Primary   Need for vaccination against Streptococcus pneumoniae using pneumococcal conjugate vaccine 13       Relevant Orders   Pneumococcal conjugate vaccine 13-valent IM       Return in about 3 months (around 02/17/2021) for chronic disease f/u.     I, Lavon Paganini, MD, have reviewed all documentation for this visit. The documentation on 11/18/20 for the exam, diagnosis, procedures, and orders are all accurate and complete.   Peirce Deveney, Dionne Bucy, MD, MPH [facility]

## 2020-11-18 NOTE — Assessment & Plan Note (Signed)
BMI 39 and assoc with HTN, prediabetes and GERD Discussed importance of healthy weight management Discussed diet and exercise  Patient would like to try Contrave for weight loss - will try generic alternative of naltrexone and wellbutrin

## 2020-11-18 NOTE — Assessment & Plan Note (Signed)
Well controlled Continue current medications Recheck metabolic panel F/u in 6 months  

## 2020-11-18 NOTE — Assessment & Plan Note (Signed)
Recommend low carb diet °Recheck A1c  °

## 2020-11-19 LAB — HEMOGLOBIN A1C
Est. average glucose Bld gHb Est-mCnc: 128 mg/dL
Hgb A1c MFr Bld: 6.1 % — ABNORMAL HIGH (ref 4.8–5.6)

## 2020-11-19 LAB — COMPREHENSIVE METABOLIC PANEL
ALT: 17 IU/L (ref 0–32)
AST: 20 IU/L (ref 0–40)
Albumin/Globulin Ratio: 1.6 (ref 1.2–2.2)
Albumin: 4.3 g/dL (ref 3.8–4.8)
Alkaline Phosphatase: 73 IU/L (ref 44–121)
BUN/Creatinine Ratio: 25 (ref 12–28)
BUN: 22 mg/dL (ref 8–27)
Bilirubin Total: 0.6 mg/dL (ref 0.0–1.2)
CO2: 23 mmol/L (ref 20–29)
Calcium: 9.4 mg/dL (ref 8.7–10.3)
Chloride: 104 mmol/L (ref 96–106)
Creatinine, Ser: 0.87 mg/dL (ref 0.57–1.00)
Globulin, Total: 2.7 g/dL (ref 1.5–4.5)
Glucose: 110 mg/dL — ABNORMAL HIGH (ref 65–99)
Potassium: 3.5 mmol/L (ref 3.5–5.2)
Sodium: 144 mmol/L (ref 134–144)
Total Protein: 7 g/dL (ref 6.0–8.5)
eGFR: 73 mL/min/{1.73_m2} (ref >59)

## 2020-11-24 ENCOUNTER — Telehealth: Payer: Self-pay

## 2020-11-24 NOTE — Telephone Encounter (Signed)
Copied from [place] [phone]. Topic: General - Other >> Nov 24, 2020 11:19 AM Pawlus, Brayton Layman A wrote: Reason for CRM: Pt had some questions about a referral to a dental clinic, Pt stated she spoke to Judson Roch who knows about her situation, please call back.

## 2021-01-04 ENCOUNTER — Other Ambulatory Visit: Payer: Self-pay | Admitting: Family Medicine

## 2021-01-04 NOTE — Telephone Encounter (Signed)
Requested Prescriptions  Pending Prescriptions Disp Refills  . hydrochlorothiazide (HYDRODIURIL) 25 MG tablet [Pharmacy Med Name: HYDROCHLOROTHIAZIDE 25 MG TAB] 90 tablet 1    Sig: TAKE 1 TABLET BY MOUTH EVERY DAY     Cardiovascular: Diuretics - Thiazide Passed - 01/04/2021  9:44 AM      Passed - Ca in normal range and within 360 days    Calcium  Date Value Ref Range Status  11/18/2020 9.4 8.7 - 10.3 mg/dL Final         Passed - Cr in normal range and within 360 days    Creatinine, Ser  Date Value Ref Range Status  11/18/2020 0.87 0.57 - 1.00 mg/dL Final         Passed - K in normal range and within 360 days    Potassium  Date Value Ref Range Status  11/18/2020 3.5 3.5 - 5.2 mmol/L Final         Passed - Na in normal range and within 360 days    Sodium  Date Value Ref Range Status  11/18/2020 144 134 - 144 mmol/L Final         Passed - Last BP in normal range    BP Readings from Last 1 Encounters:  11/18/20 134/61         Passed - Valid encounter within last 6 months    Recent Outpatient Visits          1 month ago Encounter for annual wellness visit (AWV) in Medicare patient   [facility] [facility], Dionne Bucy, MD   9 months ago Essential hypertension   [facility] [facility], Dionne Bucy, MD   1 year ago [employer] [facility] [employer] preventive visit   [facility], Dionne Bucy, MD   1 year ago Encounter for annual physical exam   [facility] [place], Dionne Bucy, MD   2 years ago Essential hypertension   [employer], Dionne Bucy, MD      Future Appointments            In 1 month [place], Dionne Bucy, MD [facility] [place]

## 2021-02-11 ENCOUNTER — Other Ambulatory Visit: Payer: Self-pay | Admitting: Family Medicine

## 2021-02-11 MED ORDER — BUPROPION HCL ER (XL) 150 MG PO TB24: 150.0000 mg | ORAL_TABLET | Freq: Every day | ORAL | 0 refills | Status: DC

## 2021-02-11 MED ORDER — NALTREXONE HCL 50 MG PO TABS: 25.0000 mg | ORAL_TABLET | Freq: Every day | ORAL | 0 refills | Status: DC

## 2021-02-23 ENCOUNTER — Ambulatory Visit: Payer: Self-pay | Admitting: Family Medicine

## 2021-03-03 ENCOUNTER — Ambulatory Visit (INDEPENDENT_AMBULATORY_CARE_PROVIDER_SITE_OTHER): Payer: Medicare Other | Admitting: Family Medicine

## 2021-03-03 ENCOUNTER — Other Ambulatory Visit: Payer: Self-pay

## 2021-03-03 ENCOUNTER — Encounter: Payer: Self-pay | Admitting: Family Medicine

## 2021-03-03 DIAGNOSIS — R7303 Prediabetes: Secondary | ICD-10-CM

## 2021-03-03 DIAGNOSIS — I10 Essential (primary) hypertension: Secondary | ICD-10-CM | POA: Diagnosis not present

## 2021-03-03 MED ORDER — BUPROPION HCL ER (XL) 300 MG PO TB24: 300.0000 mg | ORAL_TABLET | Freq: Every day | ORAL | 1 refills | Status: DC

## 2021-03-03 MED ORDER — VALACYCLOVIR HCL 1 G PO TABS: ORAL_TABLET | ORAL | 5 refills | Status: AC

## 2021-03-03 NOTE — Progress Notes (Signed)
Established patient visit   Patient: Rita Bailey   DOB: 25-Sep-1950   70 y.o. Female  MRN: [id number] Visit Date: 03/03/2021  Today's healthcare provider: Lavon Paganini, MD   Chief Complaint  Patient presents with   Obesity   Hypertension   Subjective    Hypertension Pertinent negatives include no chest pain, headaches, neck pain, palpitations or shortness of breath.    Follow up for obesity  The patient was last seen for this 3 months ago. Changes made at last visit include start Contrave. She is happy with her slow decline in weight she is working to achieve her goal of 10 lbs.   She reports excellent compliance with treatment. She feels that condition is Improved. She is not having side effects.   Patient reports moderate exercise walking for 30 minutes a day 4-5 days a week. Patient reports healthy diet and smaller portions.  Wt Readings from Last 3 Encounters:  03/03/21 241 lb 1.6 oz (109.4 kg)  11/18/20 249 lb 9.6 oz (113.2 kg)  04/02/20 248 lb (112.5 kg)   -----------------------------------------------------------------------------------------  Hypertension, follow-up  BP Readings from Last 3 Encounters:  03/03/21 134/78  11/18/20 134/61  04/02/20 (!) 149/84   Wt Readings from Last 3 Encounters:  03/03/21 241 lb 1.6 oz (109.4 kg)  11/18/20 249 lb 9.6 oz (113.2 kg)  04/02/20 248 lb (112.5 kg)     She was last seen for hypertension 3 months ago.  BP at that visit was 134/61. Management since that visit includes no changes.  She reports excellent compliance with treatment. She is not having side effects.  She is following a Low fat, Low Sodium diet. She is exercising. She does not smoke.  Use of agents associated with hypertension: none.   Outside blood pressures are monitored and ranges from 134-141 over 76-78.  Symptoms: No chest pain No chest pressure  No palpitations No syncope  No dyspnea No orthopnea  No paroxysmal nocturnal  dyspnea No lower extremity edema   Pertinent labs: Lab Results  Component Value Date   CHOL 175 04/15/2020   HDL 62 04/15/2020   LDLCALC 93 04/15/2020   TRIG 111 04/15/2020   CHOLHDL 3.0 04/16/2019   Lab Results  Component Value Date   NA 144 11/18/2020   K 3.5 11/18/2020   CREATININE 0.87 11/18/2020   GFRNONAA 69 04/15/2020   GFRAA 79 04/15/2020   GLUCOSE 110 (H) 11/18/2020     The 10-year ASCVD risk score Mikey Bussing DC Jr., et al., 2013) is: 9.1%   ---------------------------------------------------------------------------------------------------  Patient Active Problem List   Diagnosis Date Noted   Eczema 04/13/2018   Irritable bowel syndrome with diarrhea 04/13/2018   Prediabetes 04/13/2018   PMB (postmenopausal bleeding) 10/05/2017   Gastro-esophageal reflux disease with esophagitis 05/13/2017   Strain of muscle of right hip 05/06/2017   Bilateral hearing loss 04/12/2017   Scoliosis of lumbosacral spine 03/30/2017   Abnormality of gait and mobility 03/30/2017   Allergic rhinitis 09/08/2015   Hypertension 09/08/2015   Gonalgia 09/08/2015   RAD (reactive airway disease) 09/08/2015   Morbid obesity ([place]) 03/12/2015   Current tear of meniscus 01/02/2015   Tear of meniscus of knee 01/02/2015   Social History   Tobacco Use   Smoking status: Never   Smokeless tobacco: Never  Vaping Use   Vaping Use: Never used  Substance Use Topics   Alcohol use: Yes    Comment: none last 24hrs   Drug use: No  No Known Allergies   Medications: Outpatient Medications Prior to Visit  Medication Sig   Cholecalciferol 50 MCG (2000 UT) TABS Take 1 tablet by mouth daily.   fluticasone (FLONASE) 50 MCG/ACT nasal spray Place 2 sprays into both nostrils as needed.   hydrochlorothiazide (HYDRODIURIL) 25 MG tablet TAKE 1 TABLET BY MOUTH EVERY DAY   Multiple Vitamin (MULTI-VITAMINS) TABS Take 1 tablet by mouth daily.   naltrexone (DEPADE) 50 MG tablet Take 0.5 tablets (25 mg total) by  mouth daily.   [DISCONTINUED] buPROPion (WELLBUTRIN XL) 150 MG 24 hr tablet Take 1 tablet (150 mg total) by mouth daily.   [DISCONTINUED] valACYclovir (VALTREX) 1000 MG tablet TAKE 2 TABLETS TWICE DAILY AS NEEDED   No facility-administered medications prior to visit.   Review of Systems  Constitutional:  Negative for activity change, appetite change, chills, fatigue and fever.  HENT:  Negative for ear pain, sinus pressure, sinus pain and sore throat.   Eyes:  Negative for pain and visual disturbance.  Respiratory:  Negative for cough, chest tightness, shortness of breath and wheezing.   Cardiovascular:  Negative for chest pain, palpitations and leg swelling.  Gastrointestinal:  Negative for abdominal pain, blood in stool, diarrhea, nausea and vomiting.  Genitourinary:  Negative for flank pain, frequency, pelvic pain and urgency.  Musculoskeletal:  Negative for back pain, myalgias and neck pain.  Neurological:  Negative for dizziness, weakness, light-headedness, numbness and headaches.   Last metabolic panel Lab Results  Component Value Date   GLUCOSE 110 (H) 11/18/2020   NA 144 11/18/2020   K 3.5 11/18/2020   CL 104 11/18/2020   CO2 23 11/18/2020   BUN 22 11/18/2020   CREATININE 0.87 11/18/2020   GFRNONAA 69 04/15/2020   GFRAA 79 04/15/2020   CALCIUM 9.4 11/18/2020   PROT 7.0 11/18/2020   ALBUMIN 4.3 11/18/2020   LABGLOB 2.7 11/18/2020   AGRATIO 1.6 11/18/2020   BILITOT 0.6 11/18/2020   ALKPHOS 73 11/18/2020   AST 20 11/18/2020   ALT 17 11/18/2020   ANIONGAP 7 02/12/2016   Last lipids Lab Results  Component Value Date   CHOL 175 04/15/2020   HDL 62 04/15/2020   LDLCALC 93 04/15/2020   TRIG 111 04/15/2020   CHOLHDL 3.0 04/16/2019   Last hemoglobin A1c Lab Results  Component Value Date   HGBA1C 6.1 (H) 11/18/2020   Last thyroid functions Lab Results  Component Value Date   TSH 2.650 01/09/2016       Objective    BP 134/78 Comment: home reading  Pulse 66    Temp 98.6 F (37 C) (Oral)   Resp 16   Ht 5\' 7"  (1.702 m)   Wt 241 lb 1.6 oz (109.4 kg)   SpO2 98%   BMI 37.76 kg/m  BP Readings from Last 3 Encounters:  03/03/21 134/78  11/18/20 134/61  04/02/20 (!) 149/84   Wt Readings from Last 3 Encounters:  03/03/21 241 lb 1.6 oz (109.4 kg)  11/18/20 249 lb 9.6 oz (113.2 kg)  04/02/20 248 lb (112.5 kg)     Physical Exam Vitals reviewed.  Constitutional:      General: She is not in acute distress.    Appearance: Normal appearance. She is well-developed. She is not diaphoretic.  HENT:     Head: Normocephalic and atraumatic.  Eyes:     General: No scleral icterus.    Conjunctiva/sclera: Conjunctivae normal.  Neck:     Thyroid: No thyromegaly.  Cardiovascular:     Rate  and Rhythm: Normal rate and regular rhythm.     Pulses: Normal pulses.     Heart sounds: Normal heart sounds. No murmur heard. Pulmonary:     Effort: Pulmonary effort is normal. No respiratory distress.     Breath sounds: Normal breath sounds. No wheezing, rhonchi or rales.  Musculoskeletal:     Cervical back: Neck supple.     Right lower leg: No edema.     Left lower leg: No edema.  Lymphadenopathy:     Cervical: No cervical adenopathy.  Skin:    General: Skin is warm and dry.     Findings: No rash.  Neurological:     Mental Status: She is alert and oriented to person, place, and time. Mental status is at baseline.  Psychiatric:        Mood and Affect: Mood normal.        Behavior: Behavior normal.     No results found for any visits on 03/03/21.  Assessment & Plan     Problem List Items Addressed This Visit       Cardiovascular and Mediastinum   Hypertension    Well controlled on home readings Continue current medications Reviewed last metabolic panel F/u in 3 months          Other   Morbid obesity ([place]) - Primary    BMI 37 and associated HTN and HLD and prediabetes Discussed importance of healthy weight management Discussed diet and  exercise  Increase bupropion to 300mg  daily Continue naltrexone       Prediabetes    Reviewed last A1c Recheck at next visit        Return in about 3 months (around 06/03/2021) for chronic disease f/u.      I,Essence Turner,acting as a Education administrator for Lavon Paganini, MD.,have documented all relevant documentation on the behalf of Lavon Paganini, MD,as directed by  Lavon Paganini, MD while in the presence of Lavon Paganini, MD.  I, Lavon Paganini, MD, have reviewed all documentation for this visit. The documentation on 03/03/21 for the exam, diagnosis, procedures, and orders are all accurate and complete.   Charleen Madera, Dionne Bucy, MD, MPH [facility]

## 2021-03-03 NOTE — Assessment & Plan Note (Addendum)
BMI 37 and associated HTN and HLD and prediabetes Discussed importance of healthy weight management Discussed diet and exercise  Increase bupropion to 300mg  daily Continue naltrexone

## 2021-03-03 NOTE — Assessment & Plan Note (Signed)
Well controlled on home readings Continue current medications Reviewed last metabolic panel F/u in 3 months

## 2021-03-03 NOTE — Assessment & Plan Note (Signed)
Reviewed last A1c Recheck at next visit

## 2021-05-03 ENCOUNTER — Emergency Department
Admission: EM | Admit: 2021-05-03 | Discharge: 2021-05-03 | Disposition: A | Discharge status: Home / Self Care | Payer: Medicare Other | Attending: Emergency Medicine | Admitting: Emergency Medicine

## 2021-05-03 ENCOUNTER — Emergency Department: Payer: Medicare Other

## 2021-05-03 ENCOUNTER — Other Ambulatory Visit: Payer: Self-pay

## 2021-05-03 DIAGNOSIS — R0602 Shortness of breath: Secondary | ICD-10-CM | POA: Insufficient documentation

## 2021-05-03 DIAGNOSIS — E876 Hypokalemia: Secondary | ICD-10-CM | POA: Insufficient documentation

## 2021-05-03 DIAGNOSIS — Z79899 Other long term (current) drug therapy: Secondary | ICD-10-CM | POA: Diagnosis not present

## 2021-05-03 DIAGNOSIS — Z20822 Contact with and (suspected) exposure to covid-19: Secondary | ICD-10-CM | POA: Diagnosis not present

## 2021-05-03 DIAGNOSIS — R0789 Other chest pain: Secondary | ICD-10-CM | POA: Insufficient documentation

## 2021-05-03 DIAGNOSIS — R079 Chest pain, unspecified: Secondary | ICD-10-CM

## 2021-05-03 DIAGNOSIS — R519 Headache, unspecified: Secondary | ICD-10-CM | POA: Insufficient documentation

## 2021-05-03 DIAGNOSIS — I10 Essential (primary) hypertension: Secondary | ICD-10-CM | POA: Diagnosis not present

## 2021-05-03 LAB — BASIC METABOLIC PANEL
Anion gap: 8 (ref 5–15)
BUN: 17 mg/dL (ref 8–23)
CO2: 27 mmol/L (ref 22–32)
Calcium: 9 mg/dL (ref 8.9–10.3)
Chloride: 101 mmol/L (ref 98–111)
Creatinine, Ser: 0.89 mg/dL (ref 0.44–1.00)
GFR, Estimated: >60 mL/min (ref >60)
Glucose, Bld: 143 mg/dL — ABNORMAL HIGH (ref 70–99)
Potassium: 3.1 mmol/L — ABNORMAL LOW (ref 3.5–5.1)
Sodium: 136 mmol/L (ref 135–145)

## 2021-05-03 LAB — CBC
HCT: 45.4 % (ref 36.0–46.0)
Hemoglobin: 15.7 g/dL — ABNORMAL HIGH (ref 12.0–15.0)
MCH: 31 pg (ref 26.0–34.0)
MCHC: 34.6 g/dL (ref 30.0–36.0)
MCV: 89.5 fL (ref 80.0–100.0)
Platelets: 187 10*3/uL (ref 150–400)
RBC: 5.07 MIL/uL (ref 3.87–5.11)
RDW: 13 % (ref 11.5–15.5)
WBC: 11.1 10*3/uL — ABNORMAL HIGH (ref 4.0–10.5)
nRBC: 0 % (ref 0.0–0.2)

## 2021-05-03 LAB — MAGNESIUM: Magnesium: 2 mg/dL (ref 1.7–2.4)

## 2021-05-03 LAB — RESP PANEL BY RT-PCR (FLU A&B, COVID) ARPGX2
Influenza A by PCR: NEGATIVE
Influenza B by PCR: NEGATIVE
SARS Coronavirus 2 by RT PCR: NEGATIVE

## 2021-05-03 LAB — TROPONIN I (HIGH SENSITIVITY)
Troponin I (High Sensitivity): 7 ng/L (ref <18)
Troponin I (High Sensitivity): 7 ng/L (ref <18)

## 2021-05-03 MED ORDER — IBUPROFEN 400 MG PO TABS
400.0000 mg | ORAL_TABLET | Freq: Once | ORAL | Status: AC
  Administered 2021-05-03: 400 mg via ORAL
  Filled 2021-05-03: qty 1

## 2021-05-03 MED ORDER — ACETAMINOPHEN 500 MG PO TABS
1000.0000 mg | ORAL_TABLET | Freq: Once | ORAL | Status: AC
  Administered 2021-05-03: 1000 mg via ORAL
  Filled 2021-05-03: qty 2

## 2021-05-03 MED ORDER — POTASSIUM CHLORIDE CRYS ER 20 MEQ PO TBCR
40.0000 meq | EXTENDED_RELEASE_TABLET | Freq: Once | ORAL | Status: AC
  Administered 2021-05-03: 40 meq via ORAL
  Filled 2021-05-03: qty 2

## 2021-05-03 NOTE — ED Triage Notes (Signed)
Pt comes pov with left sided chest pain that started about 930 last night. Goes into back and left arm. Took a prilosec and gasx to try to relieve the pain but it got worse.

## 2021-05-03 NOTE — ED Provider Notes (Signed)
[facility] Emergency Department Provider Note  ____________________________________________   Event Date/Time   First MD Initiated Contact with Patient 05/03/21 1832     (approximate)  I have reviewed the triage vital signs and the nursing notes.   HISTORY  Chief Complaint Chest Pain   HPI Rita Bailey is a 70 y.o. female with a past medical history of hypertension who presents accompanied by husband for assessment of some left-sided chest pain she states started last night.  Patient states it feels like makes it difficult to deep with deep breaths and because of this she has some shortness of breath.  Radiate to the back on the left.  She also concerned mild headache and reported temperature of 100 earlier today.  She has no earache, vision changes, cough, fevers, neck pain, right-sided chest or back pain, abdominal pain, nausea, vomiting, diarrhea, dysuria, rash or recent injuries or falls.  No prior similar episodes.  No clearly getting any rating factors.  No history of CAD or DVT/PE.  She did take some Prilosec last night but this did not seem to help.         Past Medical History:  Diagnosis Date   Allergy    Arthritis    Hypertension     Patient Active Problem List   Diagnosis Date Noted   Eczema 04/13/2018   Irritable bowel syndrome with diarrhea 04/13/2018   Prediabetes 04/13/2018   PMB (postmenopausal bleeding) 10/05/2017   Gastro-esophageal reflux disease with esophagitis 05/13/2017   Strain of muscle of right hip 05/06/2017   Bilateral hearing loss 04/12/2017   Scoliosis of lumbosacral spine 03/30/2017   Abnormality of gait and mobility 03/30/2017   Allergic rhinitis 09/08/2015   Hypertension 09/08/2015   Gonalgia 09/08/2015   RAD (reactive airway disease) 09/08/2015   Morbid obesity ([place]) 03/12/2015   Current tear of meniscus 01/02/2015   Tear of meniscus of knee 01/02/2015    Past Surgical History:  Procedure Laterality  Date   CESAREAN SECTION     COLONOSCOPY WITH PROPOFOL N/A 05/11/2017   Procedure: COLONOSCOPY WITH PROPOFOL;  Surgeon: Robert Bellow, MD;  Location: [facility] ENDOSCOPY;  Service: Endoscopy;  Laterality: N/A;   DILATATION & CURETTAGE/HYSTEROSCOPY WITH MYOSURE N/A 02/27/2016   Procedure: DILATATION & CURETTAGE/HYSTEROSCOPY WITH MYOSURE;  Surgeon: Boykin Nearing, MD;  Location: [facility] ORS;  Service: Gynecology;  Laterality: N/A;   ESOPHAGOGASTRODUODENOSCOPY (EGD) WITH PROPOFOL N/A 05/11/2017   Procedure: ESOPHAGOGASTRODUODENOSCOPY (EGD) WITH PROPOFOL;  Surgeon: Robert Bellow, MD;  Location: [facility] ENDOSCOPY;  Service: Endoscopy;  Laterality: N/A;   sebaceous syst removed  08/24/2011   tonsillectomy     TONSILLECTOMY      Prior to Admission medications   Medication Sig Start Date End Date Taking? Authorizing Provider  buPROPion (WELLBUTRIN XL) 300 MG 24 hr tablet Take 1 tablet (300 mg total) by mouth daily. 03/03/21   Virginia Crews, MD  Cholecalciferol 50 MCG (2000 UT) TABS Take 1 tablet by mouth daily.    [provider]  fluticasone (FLONASE) 50 MCG/ACT nasal spray Place 2 sprays into both nostrils as needed. 09/16/17   Mar Daring, PA-C  hydrochlorothiazide (HYDRODIURIL) 25 MG tablet TAKE 1 TABLET BY MOUTH EVERY DAY 01/04/21   Virginia Crews, MD  Multiple Vitamin (MULTI-VITAMINS) TABS Take 1 tablet by mouth daily. 05/26/10   [provider]  naltrexone (DEPADE) 50 MG tablet Take 0.5 tablets (25 mg total) by mouth daily. 02/11/21   Virginia Crews,  MD  valACYclovir (VALTREX) 1000 MG tablet TAKE 2 TABLETS TWICE DAILY AS NEEDED 03/03/21   Bacigalupo, Dionne Bucy, MD    Allergies Patient has no known allergies.  Family History  Problem Relation Age of Onset   Diabetes Mother    Hypertension Mother    Diabetes Father    Cerebral palsy Son    Breast cancer Neg Hx     Social History Social History   Tobacco Use   Smoking status: Never    Smokeless tobacco: Never  Vaping Use   Vaping Use: Never used  Substance Use Topics   Alcohol use: Yes    Comment: none last 24hrs   Drug use: No    Review of Systems  Review of Systems  Constitutional:  Negative for chills and fever.  HENT:  Negative for sore throat.   Eyes:  Negative for pain.  Respiratory:  Positive for shortness of breath. Negative for cough and stridor.   Cardiovascular:  Positive for chest pain.  Gastrointestinal:  Negative for vomiting.  Genitourinary:  Negative for dysuria.  Musculoskeletal:  Negative for myalgias.  Skin:  Negative for rash.  Neurological:  Positive for headaches. Negative for seizures and loss of consciousness.  Psychiatric/Behavioral:  Negative for suicidal ideas.   All other systems reviewed and are negative.    ____________________________________________   PHYSICAL EXAM:  VITAL SIGNS: ED Triage Vitals  Enc Vitals Group     BP 05/03/21 1355 (!) 169/98     Pulse Rate 05/03/21 1355 61     Resp 05/03/21 1355 20     Temp 05/03/21 1355 98.9 F (37.2 C)     Temp Source 05/03/21 1355 Oral     SpO2 05/03/21 1355 96 %     Weight 05/03/21 1355 236 lb (107 kg)     Height 05/03/21 1355 '5\' 7"'$  (1.702 m)     Head Circumference --      Peak Flow --      Pain Score 05/03/21 1407 8     Pain Loc --      Pain Edu? --      Excl. in [place]? --    Vitals:   05/03/21 1355 05/03/21 1741  BP: (!) 169/98 (!) 135/111  Pulse: 61 74  Resp: 20 20  Temp: 98.9 F (37.2 C)   SpO2: 96% 97%   Physical Exam Vitals and nursing note reviewed.  Constitutional:      General: She is not in acute distress.    Appearance: She is well-developed. She is obese.  HENT:     Head: Normocephalic and atraumatic.     Right Ear: External ear normal.     Left Ear: External ear normal.     Nose: Nose normal.     Mouth/Throat:     Mouth: Mucous membranes are moist.  Eyes:     Conjunctiva/sclera: Conjunctivae normal.  Cardiovascular:     Rate and Rhythm:  Normal rate and regular rhythm.     Heart sounds: No murmur heard. Pulmonary:     Effort: Pulmonary effort is normal. No respiratory distress.     Breath sounds: Normal breath sounds.  Abdominal:     Palpations: Abdomen is soft.     Tenderness: There is no abdominal tenderness.  Musculoskeletal:     Cervical back: Neck supple.  Skin:    General: Skin is warm and dry.     Capillary Refill: Capillary refill takes less than 2 seconds.  Neurological:  Mental Status: She is alert and oriented to person, place, and time.  Psychiatric:        Mood and Affect: Mood normal.     ____________________________________________   LABS (all labs ordered are listed, but only abnormal results are displayed)  Labs Reviewed  BASIC METABOLIC PANEL - Abnormal; Notable for the following components:      Result Value   Potassium 3.1 (*)    Glucose, Bld 143 (*)    All other components within normal limits  CBC - Abnormal; Notable for the following components:   WBC 11.1 (*)    Hemoglobin 15.7 (*)    All other components within normal limits  RESP PANEL BY RT-PCR (FLU A&B, COVID) ARPGX2  MAGNESIUM  D-DIMER, QUANTITATIVE  TROPONIN I (HIGH SENSITIVITY)  TROPONIN I (HIGH SENSITIVITY)   ____________________________________________  EKG  Sinus rhythm with a ventricular rate of 90, left axis deviation, unremarkable intervals without evidence of clear acute ischemia or significant arrhythmia. ____________________________________________  RADIOLOGY  ED MD interpretation: Chest x-ray shows evidence of some left basilar atelectasis without clear focal consolidation, overt edema, pneumothorax, effusion or other clear acute thoracic process.  Official radiology report(s): DG Chest 2 View  Result Date: 05/03/2021 CLINICAL DATA:  Chest pain EXAM: CHEST - 2 VIEW COMPARISON:  Chest radiograph 09/19/2018 FINDINGS: The cardiomediastinal silhouette is within normal limits. Reticular opacities in the left  base are favored to reflect subsegmental atelectasis/scar. Otherwise, there is no focal consolidation or pulmonary edema. There is no pleural effusion or pneumothorax. There is multilevel degenerative change of the thoracic spine. There is no acute osseous abnormality. IMPRESSION: Left basilar subsegmental atelectasis/scar. Otherwise, no radiographic evidence of acute cardiopulmonary process. Electronically Signed   By: Valetta Mole M.D.   On: 05/03/2021 15:06    ____________________________________________   PROCEDURES  Procedure(s) performed (including Critical Care):  Procedures   ____________________________________________   INITIAL IMPRESSION / ASSESSMENT AND PLAN / ED COURSE        Patient presents with above-stated history exam for assessment of some left-sided chest pain associate with shortness of breath rating to the back of started last night.  On arrival patient is hypertensive with BP of 169/98 but on recheck is 135/111 with otherwise stable vital signs on room air.  On exam her lungs are clear bilaterally and there are no significant overlying skin changes over the chest and pain is not reproducible.  Differential includes pleurisy, bronchitis, PE, ACS, pneumonia, pneumothorax possible esophageal spasm.  ECG and nonelevated troponin x2 are not suggestive of ACS or myocarditis.  Chest x-ray shows evidence of some left basilar atelectasis without clear focal consolidation, overt edema, pneumothorax, effusion or other clear acute thoracic process.  BMP remarkable for K3.1 without any other significant electrolyte or metabolic derangements.  CBC shows WBC count 11.1 without acute anemia and normal platelets.  Magnesium is within normal limits.  I will obtain a D-dimer to assess risk for PE and if this is elevated obtain a CTA chest.  Care patient signed over to assuming provider approximately 8 PM.  Plan is to follow-up D-dimer and if necessary obtain a CTA chest and  reassess.  If D-dimer is less than 0.5 I think patient will be stable for discharge with close outpatient PCP follow-up.     ____________________________________________   FINAL CLINICAL IMPRESSION(S) / ED DIAGNOSES  Final diagnoses:  Chest pain, unspecified type  Hypokalemia    Medications  potassium chloride SA (KLOR-CON) CR tablet 40 mEq (40 mEq Oral Given  05/03/21 1841)  acetaminophen (TYLENOL) tablet 1,000 mg (1,000 mg Oral Given 05/03/21 1847)  ibuprofen (ADVIL) tablet 400 mg (400 mg Oral Given 05/03/21 1847)     ED Discharge Orders     None        Note:  This document was prepared using Dragon voice recognition software and may include unintentional dictation errors.    Lucrezia Starch, MD 05/03/21 [phone]

## 2021-05-03 NOTE — Discharge Instructions (Signed)
Your work-up for chest pain is incomplete at this time.  Previously reported labs, EKG, and chest ray all normal and reassuring at this time.  Your pending D-dimer test is being drawn but is pending at time of discharge.  Return to the ED medially for worsening symptoms.  You should follow results on [facility] MyChart.

## 2021-05-04 NOTE — ED Provider Notes (Signed)
-----------------------------------------   12:27 AM on 05/04/2021 -----------------------------------------  Blood pressure 136/89, pulse 79, temperature 98.9 F (37.2 C), temperature source Oral, resp. rate 20, height '5\' 7"'$  (1.702 m), weight 107 kg, SpO2 99 %.  Assuming care from Dr. Hulan Saas.  In short, Rita Bailey is a 70 y.o. female with a chief complaint of Chest Pain .  Refer to the original H&P for additional details.  The current plan of care is to await D-dimer results and dispo accordingly.  ____________________________________________   LABS (pertinent positives/negatives)  Labs Reviewed  BASIC METABOLIC PANEL - Abnormal; Notable for the following components:      Result Value   Potassium 3.1 (*)    Glucose, Bld 143 (*)    All other components within normal limits  CBC - Abnormal; Notable for the following components:   WBC 11.1 (*)    Hemoglobin 15.7 (*)    All other components within normal limits  RESP PANEL BY RT-PCR (FLU A&B, COVID) ARPGX2  MAGNESIUM  TROPONIN I (HIGH SENSITIVITY)  TROPONIN I (HIGH SENSITIVITY)  ____________________________________________  EKG  ____________________________________________   RADIOLOGY  ____________________________________________  PROCEDURES   Procedures ____________________________________________  INITIAL IMPRESSION / ASSESSMENT AND PLAN / ED COURSE  Interim check on patient, reveals that the pending lab had not been drawn.  The patient was requesting to be discharged from the ED as she had been here for 7 hours for care.  Patient apparently is endorsing significant provement of her pain, has been resting up until this time.  I encouraged the patient to at least allow [place] to draw the lab, and she still desired to leave prior to reporting, she could do so, AMA.  She was agreeable to allow the nurses to draw the lab.  She understood that she was discharging prior to complete work-up and evaluation of her  reported chest pain.  She was given strict return precautions, the lab was drawn, discharge papers were provided.  ----------------------------------------- 12:29 AM on 05/04/2021 ----------------------------------------- In an attempt to review labs and complete charts, I realized that the patient's D-dimer has not been reported.  It appears lab has been discontinued.  I spoke to the lab tech, who confirms that the quantity was not sufficient to run on the tube collected.  They spoke to the charge nurse at the time the lab was cancelled.  Patient has been discharged from the ED for several hours at this time and is unavailable for repeat lab draw.  Rita Bailey was evaluated in Emergency Department on 05/04/2021 for the symptoms described in the history of present illness. She was evaluated in the context of the global COVID-19 pandemic, which necessitated consideration that the patient might be at risk for infection with the SARS-CoV-2 virus that causes COVID-19. Institutional protocols and algorithms that pertain to the evaluation of patients at risk for COVID-19 are in a state of rapid change based on information released by regulatory bodies including the CDC and federal and state organizations. These policies and algorithms were followed during the patient's care in the ED. ____________________________________________  FINAL CLINICAL IMPRESSION(S) / ED DIAGNOSES  Final diagnoses:  Chest pain, unspecified type  Hypokalemia      Addysin Porco, Dannielle Karvonen, PA-C 05/04/21 0031    Lucrezia Starch, MD 05/04/21 1005

## 2021-05-05 ENCOUNTER — Ambulatory Visit (INDEPENDENT_AMBULATORY_CARE_PROVIDER_SITE_OTHER): Payer: Medicare Other | Admitting: Family Medicine

## 2021-05-05 ENCOUNTER — Encounter: Payer: Self-pay | Admitting: Family Medicine

## 2021-05-05 ENCOUNTER — Other Ambulatory Visit: Payer: Self-pay

## 2021-05-05 VITALS — BP 159/87 | HR 16 | Temp 99.3°F | Resp 16 | Wt 236.0 lb

## 2021-05-05 DIAGNOSIS — R079 Chest pain, unspecified: Secondary | ICD-10-CM | POA: Diagnosis not present

## 2021-05-05 DIAGNOSIS — M940 Chondrocostal junction syndrome [Tietze]: Secondary | ICD-10-CM

## 2021-05-05 DIAGNOSIS — R519 Headache, unspecified: Secondary | ICD-10-CM

## 2021-05-05 MED ORDER — PREDNISONE 20 MG PO TABS: 20.0000 mg | ORAL_TABLET | Freq: Every day | ORAL | 0 refills | Status: DC

## 2021-05-05 NOTE — Progress Notes (Signed)
I,April Miller,acting as a scribe for Wilhemena Durie, MD.,have documented all relevant documentation on the behalf of Wilhemena Durie, MD,as directed by  Wilhemena Durie, MD while in the presence of Wilhemena Durie, MD.   Established patient visit   Patient: Rita Bailey   DOB: April 23, 1951   70 y.o. Female  MRN: [id number] Visit Date: 05/05/2021  Today's healthcare provider: Wilhemena Durie, MD   Chief Complaint  Patient presents with   Hospitalization Follow-up   Subjective    HPI  Patient had negative work-up in the ED but did not have CT scan and left before D-dimer was checked.  She continues to have left-sided chest pain.  It is not exertional and she has no associated symptoms. Follow up ER visit  Patient was seen in ER for Chest pain on 05/03/2021. She was treated for chest pain. Treatment for this included labs, CXR was stable. She reports fair compliance with treatment. She reports this condition is Unchanged.  -----------------------------------------------------------------------------------------  Patient states she has been taking Tylenol and Mortin, which was advised. Patient states pain has lessened since ER visit. Patient states she still gets a slight twinge in her chest with certain movements. Patient also has symptoms of fever and sinus headache.     Medications: Outpatient Medications Prior to Visit  Medication Sig   buPROPion (WELLBUTRIN XL) 300 MG 24 hr tablet Take 1 tablet (300 mg total) by mouth daily.   Cholecalciferol 50 MCG (2000 UT) TABS Take 1 tablet by mouth daily.   fluticasone (FLONASE) 50 MCG/ACT nasal spray Place 2 sprays into both nostrils as needed.   hydrochlorothiazide (HYDRODIURIL) 25 MG tablet TAKE 1 TABLET BY MOUTH EVERY DAY   Multiple Vitamin (MULTI-VITAMINS) TABS Take 1 tablet by mouth daily.   naltrexone (DEPADE) 50 MG tablet Take 0.5 tablets (25 mg total) by mouth daily.   valACYclovir (VALTREX) 1000 MG  tablet TAKE 2 TABLETS TWICE DAILY AS NEEDED   No facility-administered medications prior to visit.    Review of Systems      Objective    BP (!) 159/87 (BP Location: Right Arm, Patient Position: Sitting, Cuff Size: Large)   Pulse (!) 16   Temp 99.3 F (37.4 C) (Oral)   Resp 16   Wt 236 lb (107 kg)   SpO2 97%   BMI 36.96 kg/m  BP Readings from Last 3 Encounters:  05/05/21 (!) 159/87  05/03/21 136/89  03/03/21 134/78   Wt Readings from Last 3 Encounters:  05/05/21 236 lb (107 kg)  05/03/21 235 lb 14.3 oz (107 kg)  03/03/21 241 lb 1.6 oz (109.4 kg)      Physical Exam Vitals reviewed.  Constitutional:      General: She is not in acute distress.    Appearance: Normal appearance. She is well-developed. She is not diaphoretic.  HENT:     Head: Normocephalic and atraumatic.  Eyes:     General: No scleral icterus.    Conjunctiva/sclera: Conjunctivae normal.  Neck:     Thyroid: No thyromegaly.  Cardiovascular:     Rate and Rhythm: Normal rate and regular rhythm.     Pulses: Normal pulses.     Heart sounds: Normal heart sounds. No murmur heard.    Comments: Left-sided chest wall is tender to palpation and reproduces the pain the patient's had.  This is in the left costochondral junction lowerchest. Pulmonary:     Effort: Pulmonary effort is normal. No respiratory distress.  Breath sounds: Normal breath sounds. No wheezing, rhonchi or rales.  Musculoskeletal:     Cervical back: Neck supple.     Right lower leg: No edema.     Left lower leg: No edema.  Lymphadenopathy:     Cervical: No cervical adenopathy.  Skin:    General: Skin is warm and dry.     Findings: No rash.  Neurological:     Mental Status: She is alert and oriented to person, place, and time. Mental status is at baseline.  Psychiatric:        Mood and Affect: Mood normal.        Behavior: Behavior normal.      No results found for any visits on 05/05/21.  Assessment & Plan     1. Chest pain,  unspecified type Pain is noncardiac.  We will test D-dimer just to make sure this is negative.  As long as this is negative we will just treat with a costochondritis. - CBC w/Diff/Platelet - D-dimer, quantitative  2. Costochondritis Treat with prednisone 20 mg daily for 5 days. - CBC w/Diff/Platelet - D-dimer, quantitative - predniSONE (DELTASONE) 20 MG tablet; Take 1 tablet (20 mg total) by mouth daily with breakfast.  Dispense: 5 tablet; Refill: 0  3. Acute nonintractable headache, unspecified headache type Mild complaint.  No other symptoms noted. - CBC w/Diff/Platelet - D-dimer, quantitative 4.  Obesity  No follow-ups on file.      I, Wilhemena Durie, MD, have reviewed all documentation for this visit. The documentation on 05/09/21 for the exam, diagnosis, procedures, and orders are all accurate and complete.    Karan Ramnauth Cranford Mon, MD  [facility] [phone] (phone) [phone] (fax)  [place]

## 2021-05-06 LAB — D-DIMER, QUANTITATIVE: D-DIMER: 0.41 mg/L FEU (ref 0.00–0.49)

## 2021-05-06 LAB — CBC WITH DIFFERENTIAL/PLATELET
Basophils Absolute: 0.1 10*3/uL (ref 0.0–0.2)
Basos: 1 %
EOS (ABSOLUTE): 0.1 10*3/uL (ref 0.0–0.4)
Eos: 2 %
Hematocrit: 45.6 % (ref 34.0–46.6)
Hemoglobin: 15.4 g/dL (ref 11.1–15.9)
Immature Grans (Abs): 0 10*3/uL (ref 0.0–0.1)
Immature Granulocytes: 0 %
Lymphocytes Absolute: 2.1 10*3/uL (ref 0.7–3.1)
Lymphs: 34 %
MCH: 30.2 pg (ref 26.6–33.0)
MCHC: 33.8 g/dL (ref 31.5–35.7)
MCV: 89 fL (ref 79–97)
Monocytes Absolute: 0.6 10*3/uL (ref 0.1–0.9)
Monocytes: 9 %
Neutrophils Absolute: 3.4 10*3/uL (ref 1.4–7.0)
Neutrophils: 54 %
Platelets: 201 10*3/uL (ref 150–450)
RBC: 5.1 x10E6/uL (ref 3.77–5.28)
RDW: 12.2 % (ref 11.7–15.4)
WBC: 6.2 10*3/uL (ref 3.4–10.8)

## 2021-05-10 ENCOUNTER — Other Ambulatory Visit: Payer: Self-pay | Admitting: Family Medicine

## 2021-05-14 ENCOUNTER — Other Ambulatory Visit: Payer: Self-pay | Admitting: Family Medicine

## 2021-05-14 NOTE — Telephone Encounter (Signed)
Requested medication (s) are due for refill today - yes  Requested medication (s) are on the active medication list -yes  Future visit scheduled -yes  Last refill: 02/11/21 #45  Notes to clinic: Request RF: non delegated Rx  Requested Prescriptions  Pending Prescriptions Disp Refills   naltrexone (DEPADE) 50 MG tablet [Pharmacy Med Name: NALTREXONE 50 MG TABLET] 45 tablet 0    Sig: TAKE 1/2 TABLET BY MOUTH DAILY     Not Delegated - Psychiatry: Drug Dependence Therapy - naltrexone Failed - 05/14/2021  1:43 PM      Failed - This refill cannot be delegated      Passed - ALT in normal range and within 360 days    ALT  Date Value Ref Range Status  11/18/2020 17 0 - 32 IU/L Final          Passed - AST in normal range and within 360 days    AST  Date Value Ref Range Status  11/18/2020 20 0 - 40 IU/L Final          Passed - Valid encounter within last 12 months    Recent Outpatient Visits           1 week ago Chest pain, unspecified type   [facility] Jerrol Banana., MD   2 months ago Morbid obesity [facility])   [facility], Dionne Bucy, MD   5 months ago Encounter for annual wellness visit (AWV) in Medicare patient   [facility] [place], Dionne Bucy, MD   1 year ago Essential hypertension   [facility] [place], Dionne Bucy, MD   1 year ago Welcome to [employer] preventive visit   [facility], Dionne Bucy, MD       Future Appointments             In 3 weeks Bacigalupo, Dionne Bucy, MD [facility]               Requested Prescriptions  Pending Prescriptions Disp Refills   naltrexone (DEPADE) 50 MG tablet [Pharmacy Med Name: NALTREXONE 50 MG TABLET] 45 tablet 0    Sig: TAKE 1/2 TABLET BY MOUTH DAILY     Not Delegated - Psychiatry: Drug Dependence Therapy - naltrexone Failed - 05/14/2021  1:43 PM      Failed - This refill cannot be delegated       Passed - ALT in normal range and within 360 days    ALT  Date Value Ref Range Status  11/18/2020 17 0 - 32 IU/L Final          Passed - AST in normal range and within 360 days    AST  Date Value Ref Range Status  11/18/2020 20 0 - 40 IU/L Final          Passed - Valid encounter within last 12 months    Recent Outpatient Visits           1 week ago Chest pain, unspecified type   [facility] Jerrol Banana., MD   2 months ago Morbid obesity [facility])   [facility] [place], Dionne Bucy, MD   5 months ago Encounter for annual wellness visit (AWV) in Medicare patient   [facility] [place], Dionne Bucy, MD   1 year ago Essential hypertension   [facility] [place], Dionne Bucy, MD   1 year ago [facility] preventive visit  [facility] Bacigalupo, Dionne Bucy, MD       Future Appointments             In 3 weeks Bacigalupo, Dionne Bucy, MD [facility] [place]

## 2021-05-15 NOTE — Telephone Encounter (Signed)
Please review. Thanks!  

## 2021-06-05 ENCOUNTER — Ambulatory Visit (INDEPENDENT_AMBULATORY_CARE_PROVIDER_SITE_OTHER): Payer: Medicare Other | Admitting: Family Medicine

## 2021-06-05 ENCOUNTER — Encounter: Payer: Self-pay | Admitting: Family Medicine

## 2021-06-05 ENCOUNTER — Other Ambulatory Visit: Payer: Self-pay

## 2021-06-05 VITALS — BP 166/107 | HR 68 | Temp 99.2°F | Ht 67.0 in | Wt 232.3 lb

## 2021-06-05 DIAGNOSIS — Z23 Encounter for immunization: Secondary | ICD-10-CM | POA: Diagnosis not present

## 2021-06-05 DIAGNOSIS — I10 Essential (primary) hypertension: Secondary | ICD-10-CM

## 2021-06-05 DIAGNOSIS — E785 Hyperlipidemia, unspecified: Secondary | ICD-10-CM | POA: Insufficient documentation

## 2021-06-05 DIAGNOSIS — Z1231 Encounter for screening mammogram for malignant neoplasm of breast: Secondary | ICD-10-CM

## 2021-06-05 DIAGNOSIS — R7303 Prediabetes: Secondary | ICD-10-CM | POA: Diagnosis not present

## 2021-06-05 DIAGNOSIS — L03115 Cellulitis of right lower limb: Secondary | ICD-10-CM | POA: Insufficient documentation

## 2021-06-05 LAB — POCT GLYCOSYLATED HEMOGLOBIN (HGB A1C): Hemoglobin A1C: 5.6 % (ref 4.0–5.6)

## 2021-06-05 MED ORDER — FLUCONAZOLE 150 MG PO TABS: 150.0000 mg | ORAL_TABLET | Freq: Once | ORAL | 0 refills | Status: AC

## 2021-06-05 MED ORDER — LISINOPRIL 20 MG PO TABS: 20.0000 mg | ORAL_TABLET | Freq: Every day | ORAL | 3 refills | Status: DC

## 2021-06-05 MED ORDER — CEPHALEXIN 500 MG PO CAPS: 500.0000 mg | ORAL_CAPSULE | Freq: Three times a day (TID) | ORAL | 0 refills | Status: AC

## 2021-06-05 NOTE — Progress Notes (Signed)
Established patient visit   Patient: Rita Bailey   DOB: [birth year]   70 y.o. Female  MRN: [id number] Visit Date: 06/05/2021  Today's healthcare provider: Lavon Paganini, MD   Chief Complaint  Patient presents with   Diabetes   Hypertension   Subjective    HPI  Hypertension, follow-up  BP Readings from Last 3 Encounters:  06/05/21 (!) 166/107  05/05/21 (!) 159/87  05/03/21 136/89   Wt Readings from Last 3 Encounters:  06/05/21 232 lb 4.8 oz (105.4 kg)  05/05/21 236 lb (107 kg)  05/03/21 235 lb 14.3 oz (107 kg)     She was last seen for hypertension 3 months ago.  BP at that visit was 134/78. Management since that visit includes no changes.  She reports good compliance with treatment. She is not having side effects.  She is following a Low Sodium diet. She is exercising. She does not smoke.  Use of agents associated with hypertension: none.   Outside blood pressures are none. Symptoms: Yes chest pain No chest pressure  No palpitations No syncope  No dyspnea No orthopnea  No paroxysmal nocturnal dyspnea No lower extremity edema   Pertinent labs: Lab Results  Component Value Date   CHOL 175 04/15/2020   HDL 62 04/15/2020   LDLCALC 93 04/15/2020   TRIG 111 04/15/2020   CHOLHDL 3.0 04/16/2019   Lab Results  Component Value Date   NA 136 05/03/2021   K 3.1 (L) 05/03/2021   CREATININE 0.89 05/03/2021   EGFR 73 11/18/2020   GFRNONAA >60 05/03/2021   GLUCOSE 143 (H) 05/03/2021     The 10-year ASCVD risk score (Arnett DK, et al., 2019) is: 13.7%   --------------------------------------------------------------------------------------------------- Prediabetes, Follow-up  Lab Results  Component Value Date   HGBA1C 5.6 06/05/2021   HGBA1C 6.1 (H) 11/18/2020   HGBA1C 6.1 (H) 04/15/2020   GLUCOSE 143 (H) 05/03/2021   GLUCOSE 110 (H) 11/18/2020   GLUCOSE 120 (H) 04/15/2020    Last seen for for this6 months ago.  Management since that  visit includes no changes. Current symptoms include none and have been unchanged.  Prior visit with dietician: no Current diet: well balanced, low salt Current exercise: walking  Pertinent Labs:    Component Value Date/Time   CHOL 175 04/15/2020 1032   TRIG 111 04/15/2020 1032   CHOLHDL 3.0 04/16/2019 0957   CREATININE 0.89 05/03/2021 1352    Wt Readings from Last 3 Encounters:  06/05/21 232 lb 4.8 oz (105.4 kg)  05/05/21 236 lb (107 kg)  05/03/21 235 lb 14.3 oz (107 kg)    -----------------------------------------------------------------------------------------     Medications: Outpatient Medications Prior to Visit  Medication Sig   buPROPion (WELLBUTRIN XL) 300 MG 24 hr tablet Take 1 tablet (300 mg total) by mouth daily.   Cholecalciferol 50 MCG (2000 UT) TABS Take 1 tablet by mouth daily.   fluticasone (FLONASE) 50 MCG/ACT nasal spray Place 2 sprays into both nostrils as needed.   hydrochlorothiazide (HYDRODIURIL) 25 MG tablet TAKE 1 TABLET BY MOUTH EVERY DAY   Multiple Vitamin (MULTI-VITAMINS) TABS Take 1 tablet by mouth daily.   naltrexone (DEPADE) 50 MG tablet TAKE 1/2 TABLET BY MOUTH DAILY   valACYclovir (VALTREX) 1000 MG tablet TAKE 2 TABLETS TWICE DAILY AS NEEDED   buPROPion (WELLBUTRIN XL) 150 MG 24 hr tablet TAKE 1 TABLET BY MOUTH EVERY DAY   [DISCONTINUED] predniSONE (DELTASONE) 20 MG tablet Take 1 tablet (20 mg total) by mouth daily  with breakfast.   No facility-administered medications prior to visit.    Review of Systems - per HPI     Objective    BP (!) 166/107 (BP Location: Right Arm, Patient Position: Sitting, Cuff Size: Large)   Pulse 68   Temp 99.2 F (37.3 C) (Oral)   Ht 5' 7"  (1.702 m)   Wt 232 lb 4.8 oz (105.4 kg)   SpO2 98%   BMI 36.38 kg/m  {Show previous vital signs (optional):23777}  Physical Exam Vitals reviewed.  Constitutional:      General: She is not in acute distress.    Appearance: Normal appearance. She is  well-developed. She is not diaphoretic.  HENT:     Head: Normocephalic and atraumatic.  Eyes:     General: No scleral icterus.    Conjunctiva/sclera: Conjunctivae normal.  Neck:     Thyroid: No thyromegaly.  Cardiovascular:     Rate and Rhythm: Normal rate and regular rhythm.     Pulses: Normal pulses.     Heart sounds: Normal heart sounds. No murmur heard. Pulmonary:     Effort: Pulmonary effort is normal. No respiratory distress.     Breath sounds: Normal breath sounds. No wheezing, rhonchi or rales.  Musculoskeletal:     Cervical back: Neck supple.     Right lower leg: No edema.     Left lower leg: No edema.  Lymphadenopathy:     Cervical: No cervical adenopathy.  Skin:    General: Skin is warm and dry.     Findings: No rash.     Comments: Area of previous skin cancer removal on R leg with purulent drainage, erythema and TTP  Neurological:     Mental Status: She is alert and oriented to person, place, and time. Mental status is at baseline.  Psychiatric:        Mood and Affect: Mood normal.        Behavior: Behavior normal.      Results for orders placed or performed in visit on 06/05/21  POCT glycosylated hemoglobin (Hb A1C)  Result Value Ref Range   Hemoglobin A1C 5.6 4.0 - 5.6 %    Assessment & Plan     Problem List Items Addressed This Visit       Cardiovascular and Mediastinum   Hypertension - Primary    Chronic and uncontrolled Continue hctz 20m daily Add lisinopril 20 mg daily Recheck metabolic panel      Relevant Medications   lisinopril (ZESTRIL) 20 MG tablet     Other   Morbid obesity (HCC)    Improving BMI 36 and associated with HTN and prediabetes Discussed importance of healthy weight management Discussed diet and exercise  Continue wellbutrin and naltrexone      Relevant Orders   Comprehensive metabolic panel   Lipid panel   Prediabetes    A1c is normal today! Encourage low carb diet      Relevant Orders   POCT glycosylated  hemoglobin (Hb A1C) (Completed)   Cellulitis of right lower extremity    New problem Treat with Keflex x7d Discussed return precautions      Other Visit Diagnoses     Need for immunization against influenza       Relevant Orders   Flu Vaccine QUAD High Dose(Fluad) (Completed)   Screening mammogram for breast cancer       Relevant Orders   MM 3D SCREEN BREAST BILATERAL        Return in about 4 weeks (  around 07/03/2021) for BP f/u.      I, Lavon Paganini, MD, have reviewed all documentation for this visit. The documentation on 06/05/21 for the exam, diagnosis, procedures, and orders are all accurate and complete.   Keyshaun Exley, Dionne Bucy, MD, MPH [facility]

## 2021-06-05 NOTE — Assessment & Plan Note (Signed)
Improving BMI 36 and associated with HTN and prediabetes Discussed importance of healthy weight management Discussed diet and exercise  Continue wellbutrin and naltrexone

## 2021-06-05 NOTE — Assessment & Plan Note (Signed)
New problem Treat with Keflex x7d Discussed return precautions

## 2021-06-05 NOTE — Assessment & Plan Note (Signed)
A1c is normal today! Encourage low carb diet

## 2021-06-05 NOTE — Assessment & Plan Note (Signed)
Chronic and uncontrolled Continue hctz 25mg  daily Add lisinopril 20 mg daily Recheck metabolic panel

## 2021-07-02 ENCOUNTER — Encounter: Payer: Self-pay | Admitting: Family Medicine

## 2021-07-02 ENCOUNTER — Other Ambulatory Visit: Payer: Self-pay | Admitting: Family Medicine

## 2021-07-02 ENCOUNTER — Ambulatory Visit (INDEPENDENT_AMBULATORY_CARE_PROVIDER_SITE_OTHER): Payer: Medicare Other | Admitting: Family Medicine

## 2021-07-02 ENCOUNTER — Other Ambulatory Visit: Payer: Self-pay

## 2021-07-02 DIAGNOSIS — I10 Essential (primary) hypertension: Secondary | ICD-10-CM | POA: Diagnosis not present

## 2021-07-02 MED ORDER — LISINOPRIL 20 MG PO TABS: 20.0000 mg | ORAL_TABLET | Freq: Every day | ORAL | 1 refills | Status: DC

## 2021-07-02 MED ORDER — HYDROCHLOROTHIAZIDE 25 MG PO TABS: 25.0000 mg | ORAL_TABLET | Freq: Every day | ORAL | 1 refills | Status: DC

## 2021-07-03 LAB — LIPID PANEL
Chol/HDL Ratio: 2.8 ratio (ref 0.0–4.4)
Cholesterol, Total: 175 mg/dL (ref 100–199)
HDL: 63 mg/dL (ref >39)
LDL Chol Calc (NIH): 94 mg/dL (ref 0–99)
Triglycerides: 103 mg/dL (ref 0–149)
VLDL Cholesterol Cal: 18 mg/dL (ref 5–40)

## 2021-07-03 LAB — COMPREHENSIVE METABOLIC PANEL WITH GFR
ALT: 19 IU/L (ref 0–32)
AST: 19 IU/L (ref 0–40)
Albumin/Globulin Ratio: 1.6 (ref 1.2–2.2)
Albumin: 4.2 g/dL (ref 3.8–4.8)
Alkaline Phosphatase: 69 IU/L (ref 44–121)
BUN/Creatinine Ratio: 12 (ref 12–28)
BUN: 14 mg/dL (ref 8–27)
Bilirubin Total: 0.6 mg/dL (ref 0.0–1.2)
CO2: 27 mmol/L (ref 20–29)
Calcium: 9.4 mg/dL (ref 8.7–10.3)
Chloride: 104 mmol/L (ref 96–106)
Creatinine, Ser: 1.16 mg/dL — ABNORMAL HIGH (ref 0.57–1.00)
Globulin, Total: 2.7 g/dL (ref 1.5–4.5)
Glucose: 111 mg/dL — ABNORMAL HIGH (ref 70–99)
Potassium: 3.8 mmol/L (ref 3.5–5.2)
Sodium: 144 mmol/L (ref 134–144)
Total Protein: 6.9 g/dL (ref 6.0–8.5)
eGFR: 52 mL/min/1.73 — ABNORMAL LOW

## 2021-07-03 NOTE — Assessment & Plan Note (Signed)
Well controlled Continue current medications Reviewed recent metabolic panel 

## 2021-07-03 NOTE — Progress Notes (Signed)
Established patient visit   Patient: Rita Bailey   DOB: 1951/03/23   70 y.o. Female  MRN: [id number] Visit Date: 07/02/2021  Today's healthcare provider: Lavon Paganini, MD   Chief Complaint  Patient presents with   Hypertension   Subjective    Hypertension   Started lisinopril in addition to her HCTZ ~68m ago.  Tolerating well without SEs.  Home BPs well controlled.   Medications: Outpatient Medications Prior to Visit  Medication Sig   buPROPion (WELLBUTRIN XL) 300 MG 24 hr tablet Take 1 tablet (300 mg total) by mouth daily.   Cholecalciferol 50 MCG (2000 UT) TABS Take 1 tablet by mouth daily.   fluticasone (FLONASE) 50 MCG/ACT nasal spray Place 2 sprays into both nostrils as needed.   hydrochlorothiazide (HYDRODIURIL) 25 MG tablet Take 1 tablet (25 mg total) by mouth daily.   lisinopril (ZESTRIL) 20 MG tablet Take 1 tablet (20 mg total) by mouth daily.   Multiple Vitamin (MULTI-VITAMINS) TABS Take 1 tablet by mouth daily.   naltrexone (DEPADE) 50 MG tablet TAKE 1/2 TABLET BY MOUTH DAILY   valACYclovir (VALTREX) 1000 MG tablet TAKE 2 TABLETS TWICE DAILY AS NEEDED   No facility-administered medications prior to visit.    Review of Systems  Respiratory: Negative.    Cardiovascular: Negative.       Objective    BP 116/70 (BP Location: Left Arm, Patient Position: Sitting, Cuff Size: Large)   Pulse 63   Wt 229 lb (103.9 kg)   BMI 35.87 kg/m  {Show previous vital signs (optional):23777}  Physical Exam Vitals reviewed.  Constitutional:      General: She is not in acute distress.    Appearance: Normal appearance. She is well-developed. She is not diaphoretic.  HENT:     Head: Normocephalic and atraumatic.  Eyes:     General: No scleral icterus.    Conjunctiva/sclera: Conjunctivae normal.  Neck:     Thyroid: No thyromegaly.  Cardiovascular:     Rate and Rhythm: Normal rate and regular rhythm.     Pulses: Normal pulses.     Heart sounds: Normal  heart sounds. No murmur heard. Pulmonary:     Effort: Pulmonary effort is normal. No respiratory distress.     Breath sounds: Normal breath sounds. No wheezing, rhonchi or rales.  Musculoskeletal:     Cervical back: Neck supple.     Right lower leg: No edema.     Left lower leg: No edema.  Lymphadenopathy:     Cervical: No cervical adenopathy.  Skin:    General: Skin is warm and dry.     Findings: No rash.  Neurological:     Mental Status: She is alert and oriented to person, place, and time. Mental status is at baseline.  Psychiatric:        Mood and Affect: Mood normal.        Behavior: Behavior normal.      No results found for any visits on 07/02/21.  Assessment & Plan     Problem List Items Addressed This Visit       Cardiovascular and Mediastinum   Hypertension    Well controlled Continue current medications Reviewed recent metabolic panel        No follow-ups on file.      I, Lavon Paganini, MD, have reviewed all documentation for this visit. The documentation on 07/03/21 for the exam, diagnosis, procedures, and orders are all accurate and complete.   Lavon Paganini  M, MD, MPH [facility]

## 2021-08-12 ENCOUNTER — Other Ambulatory Visit: Payer: Self-pay | Admitting: Family Medicine

## 2021-08-12 NOTE — Telephone Encounter (Signed)
Requested medications are due for refill today.  yes  Requested medications are on the active medications list.  yes  Last refill. 05/15/2021  Future visit scheduled.   yes  Notes to clinic.  Medication not delegated.    Requested Prescriptions  Pending Prescriptions Disp Refills   naltrexone (DEPADE) 50 MG tablet [Pharmacy Med Name: NALTREXONE 50 MG TABLET] 45 tablet 0    Sig: TAKE 1/2 TABLET BY MOUTH EVERY DAY     Not Delegated - Psychiatry: Drug Dependence Therapy - naltrexone Failed - 08/12/2021  5:11 PM      Failed - This refill cannot be delegated      Passed - ALT in normal range and within 360 days    ALT  Date Value Ref Range Status  07/02/2021 19 0 - 32 IU/L Final          Passed - AST in normal range and within 360 days    AST  Date Value Ref Range Status  07/02/2021 19 0 - 40 IU/L Final          Passed - Valid encounter within last 12 months    Recent Outpatient Visits           1 month ago Primary hypertension   [facility], Dionne Bucy, MD   2 months ago Primary hypertension   [facility] [place] Crews, MD   3 months ago Chest pain, unspecified type   [facility] Jerrol Banana., MD   5 months ago Morbid obesity [facility])   [facility] Brit[place] Romp, Dionne Bucy, MD   8 months ago Encounter for annual wellness visit (AWV) in Medicare patient   [facility], Dionne Bucy, MD

## 2021-08-26 ENCOUNTER — Other Ambulatory Visit: Payer: Self-pay | Admitting: Family Medicine

## 2021-08-26 NOTE — Telephone Encounter (Signed)
Requested Prescriptions  Pending Prescriptions Disp Refills   buPROPion (WELLBUTRIN XL) 300 MG 24 hr tablet [Pharmacy Med Name: BUPROPION HCL XL 300 MG TABLET] 90 tablet 1    Sig: TAKE 1 TABLET BY MOUTH EVERY DAY     Psychiatry: Antidepressants - bupropion Passed - 08/26/2021  9:49 AM      Passed - Last BP in normal range    BP Readings from Last 1 Encounters:  07/02/21 116/70         Passed - Valid encounter within last 6 months    Recent Outpatient Visits          1 month ago Primary hypertension   [facility] [place], Dionne Bucy, MD   2 months ago Primary hypertension   [facility] Virginia Crews, MD   3 months ago Chest pain, unspecified type   [facility] Jerrol Banana., MD   5 months ago Morbid obesity [facility])   [facility] Brita Romp, Dionne Bucy, MD   9 months ago Encounter for annual wellness visit (AWV) in Medicare patient   [facility], Dionne Bucy, MD

## 2021-09-02 ENCOUNTER — Encounter: Payer: Self-pay | Admitting: Family Medicine

## 2021-09-03 NOTE — Telephone Encounter (Signed)
Can you double book my 8am tomorrow with a virtual for her?

## 2021-09-04 ENCOUNTER — Telehealth (INDEPENDENT_AMBULATORY_CARE_PROVIDER_SITE_OTHER): Payer: Medicare Other | Admitting: Family Medicine

## 2021-09-04 DIAGNOSIS — L259 Unspecified contact dermatitis, unspecified cause: Secondary | ICD-10-CM | POA: Diagnosis not present

## 2021-09-04 DIAGNOSIS — B372 Candidiasis of skin and nail: Secondary | ICD-10-CM

## 2021-09-04 MED ORDER — TRIAMCINOLONE ACETONIDE 0.5 % EX OINT: 1.0000 "application " | TOPICAL_OINTMENT | Freq: Two times a day (BID) | CUTANEOUS | 2 refills | Status: DC

## 2021-09-04 NOTE — Progress Notes (Signed)
MyChart Video Visit    Virtual Visit via Video Note   This visit type was conducted due to national recommendations for restrictions regarding the COVID-19 Pandemic (e.g. social distancing) in an effort to limit this patient's exposure and mitigate transmission in our community. This patient is at least at moderate risk for complications without adequate follow up. This format is felt to be most appropriate for this patient at this time. Physical exam was limited by quality of the video and audio technology used for the visit.    Patient location: home Provider location: [place] involved in the visit: patient, provider   I discussed the limitations of evaluation and management by telemedicine and the availability of in person appointments. The patient expressed understanding and agreed to proceed.  Patient: Rita Bailey   DOB: 1950-10-12   71 y.o. Female  MRN: [id number] Visit Date: 09/04/2021  Today's healthcare provider: Lavon Paganini, MD   Chief Complaint  Patient presents with   Rash   Subjective    Rash   Rash under R breast and on R arm. No blisters. No pain +itchy.   Medications: Outpatient Medications Prior to Visit  Medication Sig   buPROPion (WELLBUTRIN XL) 300 MG 24 hr tablet TAKE 1 TABLET BY MOUTH EVERY DAY   Cholecalciferol 50 MCG (2000 UT) TABS Take 1 tablet by mouth daily.   fluticasone (FLONASE) 50 MCG/ACT nasal spray Place 2 sprays into both nostrils as needed.   hydrochlorothiazide (HYDRODIURIL) 25 MG tablet Take 1 tablet (25 mg total) by mouth daily.   lisinopril (ZESTRIL) 20 MG tablet Take 1 tablet (20 mg total) by mouth daily.   Multiple Vitamin (MULTI-VITAMINS) TABS Take 1 tablet by mouth daily.   naltrexone (DEPADE) 50 MG tablet TAKE 1/2 TABLET BY MOUTH EVERY DAY   valACYclovir (VALTREX) 1000 MG tablet TAKE 2 TABLETS TWICE DAILY AS NEEDED   No facility-administered medications prior to visit.    Review of  Systems  Skin:  Positive for rash.     Objective    There were no vitals taken for this visit.   Physical Exam Constitutional:      General: She is not in acute distress.    Appearance: Normal appearance.  HENT:     Head: Normocephalic.  Pulmonary:     Effort: Pulmonary effort is normal. No respiratory distress.  Skin:    Comments: + red macular rash on R shoulder, without vesicles  Neurological:     Mental Status: She is alert and oriented to person, place, and time. Mental status is at baseline.       Assessment & Plan     1. Contact dermatitis, unspecified contact dermatitis type, unspecified trigger - new problem - no known new products/contacts - no vesicles suggestive of shingles - will treat with topical steroids bid until resolution  2. Candidal intertrigo - new problem - continue OTC Clotrimazole as instructed yesterday via Mychart - if not improving next week, add triamcinolone ointment also.   Meds ordered this encounter  Medications   triamcinolone ointment (KENALOG) 0.5 %    Sig: Apply 1 application topically 2 (two) times daily.    Dispense:  30 g    Refill:  2     No follow-ups on file.     I discussed the assessment and treatment plan with the patient. The patient was provided an opportunity to ask questions and all were answered. The patient agreed with the plan and demonstrated  an understanding of the instructions.   The patient was advised to call back or seek an in-person evaluation if the symptoms worsen or if the condition fails to improve as anticipated.  I, Lavon Paganini, MD, have reviewed all documentation for this visit. The documentation on 09/04/21 for the exam, diagnosis, procedures, and orders are all accurate and complete.   Isaih Bulger, Dionne Bucy, MD, MPH [employer]

## 2021-11-10 ENCOUNTER — Ambulatory Visit
Admission: RE | Admit: 2021-11-10 | Discharge: 2021-11-10 | Disposition: A | Discharge status: Home / Self Care | Payer: Medicare Other | Source: Ambulatory Visit | Attending: Family Medicine | Admitting: Family Medicine

## 2021-11-10 ENCOUNTER — Other Ambulatory Visit: Payer: Self-pay

## 2021-11-10 ENCOUNTER — Other Ambulatory Visit: Payer: Self-pay | Admitting: Physician Assistant

## 2021-11-10 DIAGNOSIS — Z1231 Encounter for screening mammogram for malignant neoplasm of breast: Secondary | ICD-10-CM | POA: Insufficient documentation

## 2021-11-11 ENCOUNTER — Other Ambulatory Visit: Payer: Self-pay | Admitting: Family Medicine

## 2021-11-12 ENCOUNTER — Encounter: Payer: Self-pay | Admitting: Family Medicine

## 2021-11-12 MED ORDER — NALTREXONE HCL 50 MG PO TABS: 25.0000 mg | ORAL_TABLET | Freq: Every day | ORAL | 0 refills | Status: DC

## 2021-11-12 NOTE — Telephone Encounter (Signed)
Requested medication (s) are due for refill today:   Provider to review ? ?Requested medication (s) are on the active medication list:   Yes ? ?Future visit scheduled:   No ? ? ?Last ordered: 08/13/2021 #45, 0 refills ? ?Returned because it's a non delegated refill.   It was requested yesterday via MyChart  ? ?Requested Prescriptions  ?Pending Prescriptions Disp Refills  ? naltrexone (DEPADE) 50 MG tablet [Pharmacy Med Name: NALTREXONE 50 MG TABLET] 45 tablet 0  ?  Sig: TAKE 1/2 TABLET BY MOUTH EVERY DAY  ?  ? Not Delegated - Psychiatry: Drug Dependence Therapy - naltrexone Failed - 11/10/2021  3:02 PM  ?  ?  Failed - This refill cannot be delegated  ?  ?  Passed - Completed PHQ-2 or PHQ-9 in the last 360 days  ?  ?  Passed - Valid encounter within last 6 months  ?  Recent Outpatient Visits   ? ?      ? 2 months ago Contact dermatitis, unspecified contact dermatitis type, unspecified trigger  ? [facility] [place], Dionne Bucy, MD  ? 4 months ago Primary hypertension  ? [facility] [place], Dionne Bucy, MD  ? 5 months ago Primary hypertension  ? [facility] [place], Dionne Bucy, MD  ? 6 months ago Chest pain, unspecified type  ? [facility] Jerrol Banana., MD  ? 8 months ago Morbid obesity [facility])  ? [facility] Bacigal[place], Dionne Bucy, MD  ? ?  ?  ? ?  ?  ?  ? ?

## 2021-11-18 NOTE — Progress Notes (Signed)
? ? ?I, Rita Bailey,acting as a scribe for Rita Paganini, MD.,have documented all relevant documentation on the behalf of Rita Paganini, MD,as directed by  Rita Paganini, MD while in the presence of Rita Paganini, MD.  ? ? ?Annual Wellness Visit ? ?  ? ?Patient: Rita Bailey, Female    DOB: 1950-09-09, 71 y.o.   MRN: [id number] ?Visit Date: 11/19/2021 ? ?Today's Provider: Lavon Paganini, MD  ? ?Chief Complaint  ?Patient presents with  ? Medicare Wellness  ? ?Subjective  ?  ?Rita Bailey is a 71 y.o. female who presents today for her Annual Wellness Visit. ?She reports consuming a general diet. Home exercise routine includes walking 3-4 times weekly. She generally feels fairly well. She reports sleeping fairly well. She does not have additional problems to discuss today.  ? ?HPI ? ? ? ?Medications: ?Outpatient Medications Prior to Visit  ?Medication Sig  ? buPROPion (WELLBUTRIN XL) 300 MG 24 hr tablet TAKE 1 TABLET BY MOUTH EVERY DAY  ? Cholecalciferol 50 MCG (2000 UT) TABS Take 1 tablet by mouth daily.  ? fluticasone (FLONASE) 50 MCG/ACT nasal spray Place 2 sprays into both nostrils as needed.  ? hydrochlorothiazide (HYDRODIURIL) 25 MG tablet Take 1 tablet (25 mg total) by mouth daily.  ? lisinopril (ZESTRIL) 20 MG tablet Take 1 tablet (20 mg total) by mouth daily.  ? Multiple Vitamin (MULTI-VITAMINS) TABS Take 1 tablet by mouth daily.  ? naltrexone (DEPADE) 50 MG tablet Take 0.5 tablets (25 mg total) by mouth daily.  ? valACYclovir (VALTREX) 1000 MG tablet TAKE 2 TABLETS TWICE DAILY AS NEEDED  ? [DISCONTINUED] triamcinolone ointment (KENALOG) 0.5 % Apply 1 application topically 2 (two) times daily. (Patient not taking: Reported on 11/19/2021)  ? ?No facility-administered medications prior to visit.  ?  ?No Known Allergies ? ?Patient Care Team: ?Virginia Crews, MD as PCP - General (Family Medicine) ? ?Review of Systems  ?Constitutional:  Negative for chills, fatigue and fever.   ?HENT:  Negative for congestion, ear pain, rhinorrhea, sneezing and sore throat.   ?Eyes: Negative.  Negative for pain and redness.  ?Respiratory:  Negative for cough, shortness of breath and wheezing.   ?Cardiovascular:  Negative for chest pain and leg swelling.  ?Gastrointestinal:  Positive for constipation and diarrhea. Negative for abdominal pain, blood in stool and nausea.  ?Endocrine: Negative for polydipsia and polyphagia.  ?Genitourinary: Negative.  Negative for dysuria, flank pain, hematuria, pelvic pain, vaginal bleeding and vaginal discharge.  ?Musculoskeletal:  Negative for arthralgias, back pain, gait problem and joint swelling.  ?Skin:  Negative for rash.  ?Neurological: Negative.  Negative for dizziness, tremors, seizures, weakness, light-headedness, numbness and headaches.  ?Hematological:  Negative for adenopathy.  ?Psychiatric/Behavioral: Negative.  Negative for behavioral problems, confusion and dysphoric mood. The patient is not nervous/anxious and is not hyperactive.   ? ? ?  ? Objective  ?  ?Vitals: BP 109/75 (BP Location: Left Arm, Patient Position: Sitting, Cuff Size: Large)   Pulse 71   Temp 97.7 ?F (36.5 ?C) (Oral)   Resp 16   Ht '5\' 7"'$  (1.702 m)   Wt 221 lb (100.2 kg)   BMI 34.61 kg/m?  ? ? ? ?Physical Exam ?Vitals reviewed.  ?Constitutional:   ?   General: She is not in acute distress. ?   Appearance: Normal appearance. She is well-developed. She is not diaphoretic.  ?HENT:  ?   Head: Normocephalic and atraumatic.  ?   Right Ear: Tympanic membrane, ear canal  and external ear normal.  ?   Left Ear: Tympanic membrane, ear canal and external ear normal.  ?   Nose: Nose normal.  ?   Mouth/Throat:  ?   Mouth: Mucous membranes are moist.  ?   Pharynx: Oropharynx is clear. No oropharyngeal exudate.  ?Eyes:  ?   General: No scleral icterus. ?   Conjunctiva/sclera: Conjunctivae normal.  ?   Pupils: Pupils are equal, round, and reactive to light.  ?Neck:  ?   Thyroid: No thyromegaly.   ?Cardiovascular:  ?   Rate and Rhythm: Normal rate and regular rhythm.  ?   Pulses: Normal pulses.  ?   Heart sounds: Normal heart sounds. No murmur heard. ?Pulmonary:  ?   Effort: Pulmonary effort is normal. No respiratory distress.  ?   Breath sounds: Normal breath sounds. No wheezing or rales.  ?Abdominal:  ?   General: There is no distension.  ?   Palpations: Abdomen is soft.  ?   Tenderness: There is no abdominal tenderness.  ?Musculoskeletal:     ?   General: No deformity.  ?   Cervical back: Neck supple.  ?   Right lower leg: No edema.  ?   Left lower leg: No edema.  ?Lymphadenopathy:  ?   Cervical: No cervical adenopathy.  ?Skin: ?   General: Skin is warm and dry.  ?   Findings: No rash.  ?Neurological:  ?   Mental Status: She is alert and oriented to person, place, and time. Mental status is at baseline.  ?   Gait: Gait normal.  ?Psychiatric:     ?   Mood and Affect: Mood normal.     ?   Behavior: Behavior normal.     ?   Thought Content: Thought content normal.  ? ? ? ?Most recent functional status assessment: ? ?  11/19/2021  ? 11:15 AM  ?In your present state of health, do you have any difficulty performing the following activities:  ?Hearing? 0  ?Vision? 0  ?Difficulty concentrating or making decisions? 0  ?Walking or climbing stairs? 0  ?Dressing or bathing? 0  ?Doing errands, shopping? 0  ? ?Most recent fall risk assessment: ? ?  11/19/2021  ? 11:16 AM  ?Fall Risk   ?Falls in the past year? 0  ?Number falls in past yr: 0  ?Injury with Fall? 0  ?Risk for fall due to : No Fall Risks  ?Follow up Falls evaluation completed  ? ? Most recent depression screenings: ? ?  11/19/2021  ? 11:15 AM 06/05/2021  ? 10:51 AM  ?PHQ 2/9 Scores  ?PHQ - 2 Score 0 0  ?PHQ- 9 Score 0 0  ? ?Most recent cognitive screening: ? ?  04/16/2019  ?  9:13 AM  ?6CIT Screen  ?What Year? 0 points  ?What month? 0 points  ?What time? 0 points  ?Count back from 20 0 points  ?Months in reverse 0 points  ?Repeat phrase 2 points  ?Total Score 2  points  ? ?Most recent Audit-C alcohol use screening ? ?  11/19/2021  ? 11:15 AM  ?Alcohol Use Disorder Test (AUDIT)  ?1. How often do you have a drink containing alcohol? 0  ?2. How many drinks containing alcohol do you have on a typical day when you are drinking? 0  ?3. How often do you have six or more drinks on one occasion? 0  ?AUDIT-C Score 0  ? ?A score of 3 or more in  women, and 4 or more in men indicates increased risk for alcohol abuse, EXCEPT if all of the points are from question 1  ? ?No results found for any visits on 11/19/21. ? Assessment & Plan  ?  ? ?Annual wellness visit done today including the all of the following: ?Reviewed patient's Family Medical History ?Reviewed and updated list of patient's medical providers ?Assessment of cognitive impairment was done ?Assessed patient's functional ability ?Established a written schedule for health screening services ?Health Risk Assessent Completed and Reviewed ? ?Exercise Activities and Dietary recommendations ? Goals   ?None ?  ? ? ?Immunization History  ?Administered Date(s) Administered  ? Fluad Quad(high Dose 65+) 05/23/2019, 06/05/2021  ? Influenza, High Dose Seasonal PF 06/04/2020  ? Influenza,inj,Quad PF,6+ Mos 06/30/2017  ? Influenza-Unspecified 05/17/2015  ? Moderna Sars-Covid-2 Vaccination 10/10/2019, 11/09/2019, 08/13/2020  ? Pneumococcal Conjugate-13 11/18/2020  ? Pneumococcal Polysaccharide-23 04/16/2019  ? Td 04/16/2019  ? Tdap 04/29/2009  ? Zoster Recombinat (Shingrix) 04/12/2017, 10/12/2017  ? ? ?Health Maintenance  ?Topic Date Due  ? COVID-19 Vaccine (4 - Booster for Moderna series) 10/08/2020  ? INFLUENZA VACCINE  03/16/2022  ? COLONOSCOPY (Pts 45-53yr Insurance coverage will need to be confirmed)  05/11/2022  ? MAMMOGRAM  11/11/2023  ? TETANUS/TDAP  04/15/2029  ? Pneumonia Vaccine 69 Years old  Completed  ? DEXA SCAN  Completed  ? Hepatitis C Screening  Completed  ? Zoster Vaccines- Shingrix  Completed  ? HPV VACCINES  Aged Out   ? ? ? ?Discussed health benefits of physical activity, and encouraged her to engage in regular exercise appropriate for her age and condition.  ?  ?Problem List Items Addressed This Visit   ? ?  ? Cardiovascular and Medi

## 2021-11-19 ENCOUNTER — Ambulatory Visit (INDEPENDENT_AMBULATORY_CARE_PROVIDER_SITE_OTHER): Payer: Medicare Other | Admitting: Family Medicine

## 2021-11-19 ENCOUNTER — Encounter: Payer: Self-pay | Admitting: Family Medicine

## 2021-11-19 VITALS — BP 109/75 | HR 71 | Temp 97.7°F | Resp 16 | Ht 67.0 in | Wt 221.0 lb

## 2021-11-19 DIAGNOSIS — Z6834 Body mass index (BMI) 34.0-34.9, adult: Secondary | ICD-10-CM

## 2021-11-19 DIAGNOSIS — I10 Essential (primary) hypertension: Secondary | ICD-10-CM | POA: Diagnosis not present

## 2021-11-19 DIAGNOSIS — R7303 Prediabetes: Secondary | ICD-10-CM | POA: Diagnosis not present

## 2021-11-19 DIAGNOSIS — Z0000 Encounter for general adult medical examination without abnormal findings: Secondary | ICD-10-CM | POA: Diagnosis not present

## 2021-11-19 DIAGNOSIS — Z1211 Encounter for screening for malignant neoplasm of colon: Secondary | ICD-10-CM

## 2021-11-19 DIAGNOSIS — E669 Obesity, unspecified: Secondary | ICD-10-CM

## 2021-11-19 NOTE — Assessment & Plan Note (Addendum)
Discussed importance of healthy weight management ?Discussed diet and exercise  ?Continue generic Contrave alternative ?

## 2021-11-19 NOTE — Assessment & Plan Note (Signed)
Recommend low carb diet °Recheck A1c  °

## 2021-11-19 NOTE — Assessment & Plan Note (Signed)
Well controlled Continue current medications Recheck metabolic panel F/u in 6 months  

## 2021-11-20 LAB — HEMOGLOBIN A1C
Est. average glucose Bld gHb Est-mCnc: 120 mg/dL
Hgb A1c MFr Bld: 5.8 % — ABNORMAL HIGH (ref 4.8–5.6)

## 2021-11-20 LAB — BASIC METABOLIC PANEL
BUN/Creatinine Ratio: 18 (ref 12–28)
BUN: 21 mg/dL (ref 8–27)
CO2: 24 mmol/L (ref 20–29)
Calcium: 9.1 mg/dL (ref 8.7–10.3)
Chloride: 105 mmol/L (ref 96–106)
Creatinine, Ser: 1.14 mg/dL — ABNORMAL HIGH (ref 0.57–1.00)
Glucose: 97 mg/dL (ref 70–99)
Potassium: 3.8 mmol/L (ref 3.5–5.2)
Sodium: 143 mmol/L (ref 134–144)
eGFR: 52 mL/min/{1.73_m2} — ABNORMAL LOW (ref >59)

## 2021-12-11 ENCOUNTER — Other Ambulatory Visit: Payer: Self-pay | Admitting: General Surgery

## 2021-12-11 NOTE — Progress Notes (Signed)
Subjective:  ?  ? Patient ID: Rita Bailey is a 71 y.o. female. ?  ?HPI ?  ?The following portions of the patient's history were reviewed and updated as appropriate. ?  ?This an established patient is here today for: office visit. The patient has been referred by Dr. Brita Romp for evaluation of a colonoscopy. Last colonoscopy ws in 2018. ?  ?Bowels move daily, no bleeding. Denies any rectal pain. ?  ?   ?Chief Complaint  ?Patient presents with  ? New Patient  ?  ?  ?BP 124/68   Pulse 71   Temp 36.6 ?C (97.8 ?F)   Ht 170.2 cm ('5\' 7"'$ )   Wt (!) 101.4 kg (223 lb 9.6 oz)   SpO2 96%   BMI 35.02 kg/m?  ?  ?    ?Past Medical History:  ?Diagnosis Date  ? Herpes labialis    ? History of chicken pox    ? Hx of measles    ? Hx of mumps    ? Hypertension    ? Obesity    ? Osteoarthritis    ? Scoliosis of lumbosacral spine 03/30/2017  ?  ?  ?     ?Past Surgical History:  ?Procedure Laterality Date  ? excision sebaceous cyst   08/24/2011  ? Fx D&C, resection of endometrial polyp with Myosure   02/27/2016  ? COLONOSCOPY   05/11/2017  ? EGD   05/11/2017  ? CESAREAN SECTION      ? TONSILLECTOMY      ?  ?  ?  ?        ?OB History   ?  Gravida  ?2  ? Para  ?2  ? Term  ?2  ? Preterm  ?   ? AB  ?   ? Living  ?2  ?  ?  SAB  ?   ? IAB  ?   ? Ectopic  ?   ? Molar  ?   ? Multiple  ?   ? Live Births  ?   ?  ?  ?  Obstetric Comments  ?Age at first period 32 ?Age of first pregnancy 26 ?  ?   ?  ?   ?  ?  ?Social History  ?  ?  ?     ?Socioeconomic History  ? Marital status: Married  ?Tobacco Use  ? Smoking status: Never  ? Smokeless tobacco: Never  ?Substance and Sexual Activity  ? Alcohol use: Yes  ?    Alcohol/week: 1.0 standard drink  ?    Types: 1 Glasses of wine per week  ?    Comment: once a month   ? Drug use: No  ?  ?  ?  ?No Known Allergies ?  ?Current Medications  ?      ?Current Outpatient Medications  ?Medication Sig Dispense Refill  ? azelastine (ASTELIN) 137 mcg nasal spray Place 1 spray into both nostrils 2 (two) times  daily 10 mL 1  ? buPROPion (WELLBUTRIN XL) 150 MG XL tablet Take by mouth      ? cholecalciferol (VITAMIN D3) 2,000 unit tablet Take by mouth.      ? fluticasone (FLONASE) 50 mcg/actuation nasal spray Place 1 spray into both nostrils 2 (two) times daily. 16 g 0  ? hydroCHLOROthiazide (HYDRODIURIL) 25 MG tablet Take 25 mg by mouth once daily      ? lisinopriL (ZESTRIL) 20 MG tablet Take 20 mg by mouth once daily      ?  MULTIVITAMIN (MULTIPLE VITAMIN ESSENTIAL ORAL) Take by mouth.      ? naltrexone (REVIA) 50 mg tablet Take 25 mg by mouth once daily      ? valACYclovir (VALTREX) 1000 MG tablet Take by mouth      ? benzonatate (TESSALON) 200 MG capsule Take 1 capsule (200 mg total) by mouth 3 (three) times daily as needed for Cough (Patient not taking: Reported on 06/16/2021) 30 capsule 1  ? fluconazole (DIFLUCAN) 150 MG tablet TAKE 1 TABLET (150 MG TOTAL) BY MOUTH ONCE FOR 1 DOSE. MAY REPEAT IN 3 DAYS IF SYMPTOMS NOT RESOLVED (Patient not taking: Reported on 12/10/2021)      ? hydrocodone-chlorpheniramine (TUSSIONEX) 10-8 mg/5 mL ER suspension Take 5 mLs by mouth nightly as needed for Cough (Patient not taking: Reported on 12/10/2021) 120 mL 0  ? triamcinolone 0.1 % cream Apply topically. (Patient not taking: Reported on 06/16/2021)      ? triamterene-hydrochlorothiazide (MAXZIDE-25) 37.5-25 mg tablet  (Patient not taking: Reported on 06/16/2021)   0  ?  ?No current facility-administered medications for this visit.  ?  ?  ?  ?     ?Family History  ?Problem Relation Age of Onset  ? Diabetes Mother    ? High blood pressure (Hypertension) Mother    ? Diabetes type II Father    ? Cerebral palsy Son    ? Breast cancer Neg Hx    ?  ?  ?  ?Labs and Radiology:  ?  ?January 20, 2007 colonoscopy: ?  ?10 mm polyp in the rectosigmoid, hyperplastic mucosa. ?  ?May 11, 2017 colonoscopy: ?  ?Tubular adenoma x2.  Less than 10 mm. ? Descending and sigmoid colon.  ?  ?  ?  ?  ?  ?Review of Systems  ?Constitutional: Negative for chills and  fever.  ?Respiratory: Negative for cough.   ?Gastrointestinal: Negative for anal bleeding.  ?  ?  ?   ?Objective:  ? Physical Exam ?Exam conducted with a chaperone present.  ?Constitutional:   ?   Appearance: Normal appearance.  ?Cardiovascular:  ?   Rate and Rhythm: Normal rate and regular rhythm.  ?   Pulses: Normal pulses.  ?   Heart sounds: Normal heart sounds.  ?Pulmonary:  ?   Effort: Pulmonary effort is normal.  ?   Breath sounds: Normal breath sounds.  ?Musculoskeletal:  ?   Cervical back: Neck supple.  ?Skin: ?   General: Skin is warm and dry.  ?Neurological:  ?   Mental Status: She is alert and oriented to person, place, and time.  ?Psychiatric:     ?   Mood and Affect: Mood normal.     ?   Behavior: Behavior normal.  ?  ?  ?  ?   ?Assessment:  ?   ?Past history colonic tubular adenoma. ?   ?Plan:  ?   ?At the time of the patient's 2018 exam standard follow-up was every 5 years.  Since the then the 2020 [place] multisociety task force recommendations came out suggesting that a appropriate interval may be stretched to 7-9 years for patients with 2 or less polyps less than 10 mm in diameter. ?  ?The options at this time are for screening at this time or deferring another 2 years. ?  ?Reviewing pros and cons the patient has decided to proceed with screening at this time. ?  ?The risks of colonoscopy were discussed in detail. ?  ?We will plan to have  this completed in the near future as an outpatient at [facility]. ?   ?This note is partially prepared by Ledell Noss, CMA acting as a scribe in the presence of Dr. Hervey Ard, MD.  ?  ?The documentation recorded by the scribe accurately reflects the service I personally performed and the decisions made by me.  ?  ?Robert Bellow, MD FACS ?  ?

## 2021-12-15 ENCOUNTER — Encounter: Payer: Self-pay | Admitting: General Surgery

## 2021-12-16 ENCOUNTER — Other Ambulatory Visit: Payer: Self-pay

## 2021-12-16 ENCOUNTER — Ambulatory Visit
Admission: RE | Admit: 2021-12-16 | Discharge: 2021-12-16 | Disposition: A | Discharge status: Home / Self Care | Payer: Medicare Other | Attending: General Surgery | Admitting: General Surgery

## 2021-12-16 ENCOUNTER — Ambulatory Visit: Payer: Medicare Other | Admitting: Anesthesiology

## 2021-12-16 ENCOUNTER — Encounter
Admission: RE | Disposition: A | Discharge status: Home / Self Care | Payer: Self-pay | Source: Home / Self Care | Attending: General Surgery

## 2021-12-16 ENCOUNTER — Encounter: Payer: Self-pay | Admitting: General Surgery

## 2021-12-16 DIAGNOSIS — E669 Obesity, unspecified: Secondary | ICD-10-CM | POA: Diagnosis not present

## 2021-12-16 DIAGNOSIS — Z1211 Encounter for screening for malignant neoplasm of colon: Secondary | ICD-10-CM | POA: Insufficient documentation

## 2021-12-16 DIAGNOSIS — Z86010 Personal history of colonic polyps: Secondary | ICD-10-CM | POA: Insufficient documentation

## 2021-12-16 DIAGNOSIS — D122 Benign neoplasm of ascending colon: Secondary | ICD-10-CM | POA: Diagnosis not present

## 2021-12-16 DIAGNOSIS — K5730 Diverticulosis of large intestine without perforation or abscess without bleeding: Secondary | ICD-10-CM | POA: Insufficient documentation

## 2021-12-16 DIAGNOSIS — M1990 Unspecified osteoarthritis, unspecified site: Secondary | ICD-10-CM | POA: Insufficient documentation

## 2021-12-16 DIAGNOSIS — I10 Essential (primary) hypertension: Secondary | ICD-10-CM | POA: Insufficient documentation

## 2021-12-16 DIAGNOSIS — D123 Benign neoplasm of transverse colon: Secondary | ICD-10-CM | POA: Insufficient documentation

## 2021-12-16 DIAGNOSIS — D124 Benign neoplasm of descending colon: Secondary | ICD-10-CM | POA: Diagnosis not present

## 2021-12-16 HISTORY — DX: Scoliosis, unspecified: M41.9

## 2021-12-16 HISTORY — PX: COLONOSCOPY WITH PROPOFOL: SHX5780

## 2021-12-16 SURGERY — COLONOSCOPY WITH PROPOFOL
Anesthesia: General

## 2021-12-16 MED ORDER — PROPOFOL 10 MG/ML IV BOLUS
INTRAVENOUS | Status: DC | PRN
  Administered 2021-12-16: 50 mg via INTRAVENOUS

## 2021-12-16 MED ORDER — PROPOFOL 500 MG/50ML IV EMUL
INTRAVENOUS | Status: DC | PRN
Start: 2021-12-16 — End: 2021-12-16
  Administered 2021-12-16: 200 ug/kg/min via INTRAVENOUS

## 2021-12-16 MED ORDER — SODIUM CHLORIDE 0.9 % IV SOLN: INTRAVENOUS | Status: DC

## 2021-12-16 NOTE — Op Note (Signed)
[facility] ?Gastroenterology ?Patient Name: Rita Bailey ?Procedure Date: 12/16/2021 10:45 AM ?MRN: [id number] ?Account #: [id number] ?Date of Birth: [birth year] ?Admit Type: Outpatient ?Age: 71 ?Room: [facility] ENDO ROOM 1 ?Gender: Female ?Note Status: Finalized ?Instrument Name: Peds Colonoscope [id number] ?Procedure:             Colonoscopy ?Indications:           High risk colon cancer surveillance: Personal history  ?                       of colonic polyps ?Providers:             Robert Bellow, MD ?Referring MD:          Dionne Bucy. Bacigalupo (Referring MD) ?Medicines:             Propofol per Anesthesia ?Procedure:             Pre-Anesthesia Assessment: ?                       - Prior to the procedure, a History and Physical was  ?                       performed, and patient medications, allergies and  ?                       sensitivities were reviewed. The patient's tolerance  ?                       of previous anesthesia was reviewed. ?                       - The risks and benefits of the procedure and the  ?                       sedation options and risks were discussed with the  ?                       patient. All questions were answered and informed  ?                       consent was obtained. ?                       After obtaining informed consent, the colonoscope was  ?                       passed under direct vision. Throughout the procedure,  ?                       the patient's blood pressure, pulse, and oxygen  ?                       saturations were monitored continuously. The  ?                       Colonoscope was introduced through the anus and  ?                       advanced to the the cecum, identified by appendiceal  ?  orifice and ileocecal valve. The colonoscopy was  ?                       performed without difficulty. The patient tolerated  ?                       the procedure well. The quality of the bowel  ?                        preparation was excellent. ?Findings: ?     A 6 mm polyp was found in the mid ascending colon. The polyp was  ?     sessile. Biopsies were taken with a cold forceps for histology. ?     A 6 mm polyp was found in the transverse colon. The polyp was sessile.  ?     The polyp was removed with a hot snare. Resection and retrieval were  ?     complete. ?     Two sessile polyps were found in the descending colon. The polyps were 5  ?     to 9 mm in size. These polyps were removed with a hot snare. Resection  ?     and retrieval were complete. ?     The retroflexed view of the distal rectum and anal verge was normal and  ?     showed no anal or rectal abnormalities. ?     A few small-mouthed diverticula were found in the sigmoid colon. ?Impression:            - One 6 mm polyp in the mid ascending colon. Biopsied. ?                       - One 6 mm polyp in the transverse colon, removed with  ?                       a hot snare. Resected and retrieved. ?                       - Two 5 to 9 mm polyps in the descending colon,  ?                       removed with a hot snare. Resected and retrieved. ?                       - The distal rectum and anal verge are normal on  ?                       retroflexion view. ?                       - Diverticulosis in the sigmoid colon. ?Recommendation:        - Telephone endoscopist for pathology results in 1  ?                       week. ?Procedure Code(s):     --- Professional --- ?                       [phone], Colonoscopy, flexible; with removal of  ?  tumor(s), polyp(s), or other lesion(s) by snare  ?                       technique ?                       [id number], 59, Colonoscopy, flexible; with biopsy, single  ?                       or multiple ?Diagnosis Code(s):     --- Professional --- ?                       K63.5, Polyp of colon ?                       Z86.010, Personal history of colonic polyps ?                       K57.30, Diverticulosis of large  intestine without  ?                       perforation or abscess without bleeding ?CPT copyright 2019 [employer]. All rights reserved. ?The codes documented in this report are preliminary and upon coder review may  ?be revised to meet current compliance requirements. ?Robert Bellow, MD ?12/16/2021 11:36:06 AM ?This report has been signed electronically. ?Number of Addenda: 0 ?Note Initiated On: 12/16/2021 10:45 AM ?Scope Withdrawal Time: 0 hours 21 minutes 56 seconds  ?Total Procedure Duration: 0 hours 27 minutes 50 seconds  ?     [facility] ?

## 2021-12-16 NOTE — Transfer of Care (Signed)
Immediate Anesthesia Transfer of Care Note ? ?Patient: Rita Bailey ? ?Procedure(s) Performed: COLONOSCOPY WITH PROPOFOL ? ?Patient Location: PACU ? ?Anesthesia Type:General ? ?Level of Consciousness: awake ? ?Airway & Oxygen Therapy: Patient Spontanous Breathing and Patient connected to nasal cannula oxygen ? ?Post-op Assessment: Report given to RN and Post -op Vital signs reviewed and stable ? ?Post vital signs: Reviewed and stable ? ?Last Vitals:  ?Vitals Value Taken Time  ?BP 109/57 12/16/21 1139  ?Temp    ?Pulse 58 12/16/21 1139  ?Resp 17 12/16/21 1139  ?SpO2 100 % 12/16/21 1139  ?Vitals shown include unvalidated device data. ? ?Last Pain:  ?Vitals:  ? 12/16/21 1013  ?TempSrc: Temporal  ?   ? ?  ? ?Complications: No notable events documented. ?

## 2021-12-16 NOTE — Anesthesia Preprocedure Evaluation (Signed)
Anesthesia Evaluation  ?Patient identified by MRN, date of birth, ID band ?Patient awake ? ? ? ?Reviewed: ?Allergy & Precautions, NPO status , Patient's Chart, lab work & pertinent test results ? ?Airway ?Mallampati: II ? ?TM Distance: >3 FB ?Neck ROM: Full ? ? ? Dental ? ?(+) Teeth Intact ?  ?Pulmonary ?neg pulmonary ROS,  ?  ?Pulmonary exam normal ?breath sounds clear to auscultation ? ? ? ? ? ? Cardiovascular ?Exercise Tolerance: Good ?hypertension, Pt. on medications ?negative cardio ROS ?Normal cardiovascular exam ?Rhythm:Regular  ? ?  ?Neuro/Psych ?negative neurological ROS ? negative psych ROS  ? GI/Hepatic ?negative GI ROS, Neg liver ROS,   ?Endo/Other  ?negative endocrine ROS ? Renal/GU ?negative Renal ROS  ?negative genitourinary ?  ?Musculoskeletal ? ?(+) Arthritis ,  ? Abdominal ?(+) + obese,   ?Peds ?negative pediatric ROS ?(+)  Hematology ?negative hematology ROS ?(+)   ?Anesthesia Other Findings ?Past Medical History: ?No date: Allergy ?No date: Arthritis ?No date: Hypertension ?No date: Scoliosis of lumbosacral spine ? ?Past Surgical History: ?No date: CESAREAN SECTION ?05/11/2017: COLONOSCOPY WITH PROPOFOL; N/A ?    Comment:  Procedure: COLONOSCOPY WITH PROPOFOL;  Surgeon: Bary Castilla, ?             Forest Gleason, MD;  Location: [facility];  Service:  ?             Endoscopy;  Laterality: N/A; ?02/27/2016: DILATATION & CURETTAGE/HYSTEROSCOPY WITH MYOSURE; N/A ?    Comment:  Procedure: DILATATION & CURETTAGE/HYSTEROSCOPY WITH  ?             MYOSURE;  Surgeon: Boykin Nearing, MD;  Location:  ?             [facility] ORS;  Service: Gynecology;  Laterality: N/A; ?05/11/2017: ESOPHAGOGASTRODUODENOSCOPY (EGD) WITH PROPOFOL; N/A ?    Comment:  Procedure: ESOPHAGOGASTRODUODENOSCOPY (EGD) WITH  ?             PROPOFOL;  Surgeon: Robert Bellow, MD;  Location:  ?             [facility];  Service: Endoscopy;  Laterality: N/A; ?08/24/2011: sebaceous syst removed ?No date:  tonsillectomy ?No date: TONSILLECTOMY ? ? ? ? Reproductive/Obstetrics ?negative OB ROS ? ?  ? ? ? ? ? ? ? ? ? ? ? ? ? ?  ?  ? ? ? ? ? ? ? ? ?Anesthesia Physical ?Anesthesia Plan ? ?ASA: 3 ? ?Anesthesia Plan: General  ? ?Post-op Pain Management:   ? ?Induction: Intravenous ? ?PONV Risk Score and Plan: Propofol infusion and TIVA ? ?Airway Management Planned: Natural Airway and Nasal Cannula ? ?Additional Equipment:  ? ?Intra-op Plan:  ? ?Post-operative Plan:  ? ?Informed Consent: I have reviewed the patients History and Physical, chart, labs and discussed the procedure including the risks, benefits and alternatives for the proposed anesthesia with the patient or authorized representative who has indicated his/her understanding and acceptance.  ? ? ? ?Dental Advisory Given ? ?Plan Discussed with: CRNA and Surgeon ? ?Anesthesia Plan Comments:   ? ? ? ? ? ? ?Anesthesia Quick Evaluation ? ?

## 2021-12-16 NOTE — Anesthesia Procedure Notes (Signed)
Date/Time: 12/16/2021 11:05 AM ?Performed by: Nelda Marseille, CRNA ?Pre-anesthesia Checklist: Patient identified, Emergency Drugs available, Suction available, Patient being monitored and Timeout performed ?Oxygen Delivery Method: Nasal cannula ? ? ? ? ?

## 2021-12-16 NOTE — H&P (Signed)
Rita Bailey ?[id number] ?[birth year] ? ?  ? ?HPI: Healthy 71 y/o woman with a past history of colonic polyps. For colonoscopy. Tolerated the prep well.  Skipped the last 8 oz.  ? ?Medications Prior to Admission  ?Medication Sig Dispense Refill Last Dose  ? buPROPion (WELLBUTRIN XL) 300 MG 24 hr tablet TAKE 1 TABLET BY MOUTH EVERY DAY 90 tablet 1 12/15/2021  ? Cholecalciferol 50 MCG (2000 UT) TABS Take 1 tablet by mouth daily.   12/15/2021  ? fluticasone (FLONASE) 50 MCG/ACT nasal spray Place 2 sprays into both nostrils as needed. 16 g 0 12/15/2021  ? hydrochlorothiazide (HYDRODIURIL) 25 MG tablet Take 1 tablet (25 mg total) by mouth daily. 90 tablet 1 12/15/2021  ? lisinopril (ZESTRIL) 20 MG tablet Take 1 tablet (20 mg total) by mouth daily. 90 tablet 1 12/15/2021  ? Multiple Vitamin (MULTI-VITAMINS) TABS Take 1 tablet by mouth daily.   12/15/2021  ? naltrexone (DEPADE) 50 MG tablet Take 0.5 tablets (25 mg total) by mouth daily. 45 tablet 0 12/15/2021  ? valACYclovir (VALTREX) 1000 MG tablet TAKE 2 TABLETS TWICE DAILY AS NEEDED 30 tablet 5 Past Month  ? azelastine (ASTELIN) 0.1 % nasal spray Place 2 sprays into both nostrils 2 (two) times daily. Use in each nostril as directed (Patient not taking: Reported on 12/16/2021)   Not Taking  ? ?No Known Allergies ?Past Medical History:  ?Diagnosis Date  ? Allergy   ? Arthritis   ? Hypertension   ? Scoliosis of lumbosacral spine   ? ?Past Surgical History:  ?Procedure Laterality Date  ? CESAREAN SECTION    ? COLONOSCOPY WITH PROPOFOL N/A 05/11/2017  ? Procedure: COLONOSCOPY WITH PROPOFOL;  Surgeon: Robert Bellow, MD;  Location: [facility];  Service: Endoscopy;  Laterality: N/A;  ? DILATATION & CURETTAGE/HYSTEROSCOPY WITH MYOSURE N/A 02/27/2016  ? Procedure: [place];  Surgeon: Boykin Nearing, MD;  Location: [facility] ORS;  Service: Gynecology;  Laterality: N/A;  ? ESOPHAGOGASTRODUODENOSCOPY (EGD) WITH PROPOFOL N/A 05/11/2017  ? Procedure:  ESOPHAGOGASTRODUODENOSCOPY (EGD) WITH PROPOFOL;  Surgeon: Robert Bellow, MD;  Location: [facility];  Service: Endoscopy;  Laterality: N/A;  ? sebaceous syst removed  08/24/2011  ? tonsillectomy    ? TONSILLECTOMY    ? ?Social History  ? ?Socioeconomic History  ? Marital status: Married  ?  Spouse name: Dorothyann Peng  ? Number of children: 1  ? Years of education: Bachelors  ? Highest education level: Not on file  ?Occupational History  ?  Employer: [employer]  ?Tobacco Use  ? Smoking status: Never  ? Smokeless tobacco: Never  ?Vaping Use  ? Vaping Use: Never used  ?Substance and Sexual Activity  ? Alcohol use: Yes  ?  Alcohol/week: 1.0 standard drink  ?  Types: 1 Glasses of wine per week  ? Drug use: No  ? Sexual activity: Yes  ?Other Topics Concern  ? Not on file  ?Social History Narrative  ? Not on file  ? ?Social Determinants of Health  ? ?Financial Resource Strain: Not on file  ?Food Insecurity: Not on file  ?Transportation Needs: Not on file  ?Physical Activity: Not on file  ?Stress: Not on file  ?Social Connections: Not on file  ?Intimate Partner Violence: Not on file  ? ?Social History  ? ?Social History Narrative  ? Not on file  ? ? ? ?ROS: Negative.  ? ?January 20, 2007 colonoscopy: ?  ?10 mm polyp in the rectosigmoid, hyperplastic mucosa. ?  ?  May 11, 2017 colonoscopy: ?  ?Tubular adenoma x2.  Less than 10 mm. ? Descending and sigmoid colon.  ? ?PE: ?HEENT: Negative. ?Lungs: Clear. ?Cardio: RR. ? ?Assessment/Plan: ? ?Proceed with planned endoscopy.  ? ?Robert Bellow ?12/16/2021 ? ? ?  ?

## 2021-12-17 LAB — SURGICAL PATHOLOGY

## 2021-12-25 NOTE — Anesthesia Postprocedure Evaluation (Signed)
Anesthesia Post Note ? ?Patient: Rita Bailey ? ?Procedure(s) Performed: COLONOSCOPY WITH PROPOFOL ? ?Patient location during evaluation: PACU ?Anesthesia Type: General ?Level of consciousness: awake and oriented ?Pain management: satisfactory to patient ?Vital Signs Assessment: post-procedure vital signs reviewed and stable ?Respiratory status: spontaneous breathing and nonlabored ventilation ?Cardiovascular status: stable ?Anesthetic complications: no ? ? ?No notable events documented. ? ? ?Last Vitals:  ?Vitals:  ? 12/16/21 1149 12/16/21 1159  ?BP: (!) 122/58 139/73  ?Pulse: 60 (!) 58  ?Resp: 17 15  ?Temp: (!) 35.7 ?C   ?SpO2: 100% 100%  ?  ?Last Pain:  ?Vitals:  ? 12/17/21 0738  ?TempSrc:   ?PainSc: 0-No pain  ? ? ?  ?  ?  ?  ?  ?  ? ?VAN STAVEREN,Millette Halberstam ? ? ? ? ?

## 2022-01-15 ENCOUNTER — Encounter: Payer: Self-pay | Admitting: Family Medicine

## 2022-02-10 ENCOUNTER — Other Ambulatory Visit: Payer: Self-pay | Admitting: Family Medicine

## 2022-02-11 ENCOUNTER — Encounter: Payer: Self-pay | Admitting: Family Medicine

## 2022-02-11 MED ORDER — NALTREXONE HCL 50 MG PO TABS: 25.0000 mg | ORAL_TABLET | Freq: Every day | ORAL | 0 refills | Status: DC

## 2022-02-12 MED ORDER — FLUCONAZOLE 150 MG PO TABS: 150.0000 mg | ORAL_TABLET | Freq: Once | ORAL | 0 refills | Status: AC

## 2022-02-12 NOTE — Telephone Encounter (Signed)
Ok to send in Fluconazole 1 pill once #1 r0

## 2022-02-19 ENCOUNTER — Other Ambulatory Visit: Payer: Self-pay | Admitting: Family Medicine

## 2022-02-19 NOTE — Telephone Encounter (Signed)
Requested Prescriptions  Pending Prescriptions Disp Refills  . buPROPion (WELLBUTRIN XL) 300 MG 24 hr tablet [Pharmacy Med Name: BUPROPION HCL XL 300 MG TABLET] 90 tablet 1    Sig: TAKE 1 TABLET BY MOUTH EVERY DAY     Psychiatry: Antidepressants - bupropion Failed - 02/19/2022  1:57 AM      Failed - Cr in normal range and within 360 days    Creatinine, Ser  Date Value Ref Range Status  11/19/2021 1.14 (H) 0.57 - 1.00 mg/dL Final         Passed - AST in normal range and within 360 days    AST  Date Value Ref Range Status  07/02/2021 19 0 - 40 IU/L Final         Passed - ALT in normal range and within 360 days    ALT  Date Value Ref Range Status  07/02/2021 19 0 - 32 IU/L Final         Passed - Last BP in normal range    BP Readings from Last 1 Encounters:  12/16/21 139/73         Passed - Valid encounter within last 6 months    Recent Outpatient Visits          3 months ago Encounter for annual wellness visit (AWV) in Medicare patient   [facility], Dionne Bucy, MD   5 months ago Contact dermatitis, unspecified contact dermatitis type, unspecified trigger   [facility], Dionne Bucy, MD   7 months ago Primary hypertension   [facility], Dionne Bucy, MD   8 months ago Primary hypertension   [facility] Virginia Crews, MD   9 months ago Chest pain, unspecified type   [facility] Jerrol Banana., MD      Future Appointments            In 3 months Bacigalupo, Dionne Bucy, MD [facility], [place]

## 2022-02-26 ENCOUNTER — Other Ambulatory Visit: Payer: Self-pay | Admitting: Family Medicine

## 2022-03-26 ENCOUNTER — Encounter: Payer: Self-pay | Admitting: Family Medicine

## 2022-03-26 ENCOUNTER — Other Ambulatory Visit: Payer: Self-pay | Admitting: Family Medicine

## 2022-03-26 DIAGNOSIS — I10 Essential (primary) hypertension: Secondary | ICD-10-CM

## 2022-03-26 MED ORDER — HYDROCHLOROTHIAZIDE 25 MG PO TABS: 25.0000 mg | ORAL_TABLET | Freq: Every day | ORAL | 1 refills | Status: DC

## 2022-03-26 NOTE — Telephone Encounter (Signed)
rx was sent to pharmacy today with confirmation, 03/26/22 #90/1 RF Requested Prescriptions  Pending Prescriptions Disp Refills  . hydrochlorothiazide (HYDRODIURIL) 25 MG tablet 90 tablet 1    Sig: Take 1 tablet (25 mg total) by mouth daily.     Cardiovascular: Diuretics - Thiazide Failed - 03/26/2022  1:49 PM      Failed - Cr in normal range and within 180 days    Creatinine, Ser  Date Value Ref Range Status  11/19/2021 1.14 (H) 0.57 - 1.00 mg/dL Final         Passed - K in normal range and within 180 days    Potassium  Date Value Ref Range Status  11/19/2021 3.8 3.5 - 5.2 mmol/L Final         Passed - Na in normal range and within 180 days    Sodium  Date Value Ref Range Status  11/19/2021 143 134 - 144 mmol/L Final         Passed - Last BP in normal range    BP Readings from Last 1 Encounters:  12/16/21 139/73         Passed - Valid encounter within last 6 months    Recent Outpatient Visits          4 months ago Encounter for annual wellness visit (AWV) in Medicare patient   [facility], Dionne Bucy, MD   6 months ago Contact dermatitis, unspecified contact dermatitis type, unspecified trigger   [facility], Dionne Bucy, MD   8 months ago Primary hypertension   [facility], Dionne Bucy, MD   9 months ago Primary hypertension   [facility] [place], Dionne Bucy, MD   10 months ago Chest pain, unspecified type   [facility] Jerrol Banana., MD      Future Appointments            In 1 month Bacigalupo, Dionne Bucy, MD [facility], [place]

## 2022-03-26 NOTE — Telephone Encounter (Signed)
Medication Refill - Medication: hydrochlorothiazide (HYDRODIURIL) 25 MG tablet [[id number]]   Has the patient contacted their pharmacy? Yes.   (Agent: If no, request that the patient contact the pharmacy for the refill. If patient does not wish to contact the pharmacy document the reason why and proceed with request.) (Agent: If yes, when and what did the pharmacy advise?)  Preferred Pharmacy (with phone number or street name):  [id number] [place], [place] [address] [id number] Phone: [phone]: [phone] Hours: Not open 24 hours   Has the patient been seen for an appointment in the last year OR does the patient have an upcoming appointment? Yes.    Agent: Please be advised that RX refills may take up to 3 business days. We ask that you follow-up with your pharmacy.

## 2022-05-12 ENCOUNTER — Other Ambulatory Visit: Payer: Self-pay | Admitting: Family Medicine

## 2022-05-12 ENCOUNTER — Encounter: Payer: Self-pay | Admitting: Family Medicine

## 2022-05-12 MED ORDER — NALTREXONE HCL 50 MG PO TABS: 25.0000 mg | ORAL_TABLET | Freq: Every day | ORAL | 0 refills | Status: DC

## 2022-05-20 ENCOUNTER — Other Ambulatory Visit: Payer: Self-pay | Admitting: Family Medicine

## 2022-05-21 NOTE — Progress Notes (Signed)
I,[name],acting as a Education administrator for Lavon Paganini, MD.,have documented all relevant documentation on the behalf of Lavon Paganini, MD,as directed by  Lavon Paganini, MD while in the presence of Lavon Paganini, MD.     Established patient visit   Patient: Rita Bailey   DOB: 08-12-1951   71 y.o. Female  MRN: [id number] Visit Date: 05/24/2022  Today's healthcare provider: Lavon Paganini, MD   Chief Complaint  Patient presents with   Hypertension   Hyperglycemia   Subjective    HPI  Hypertension, follow-up  BP Readings from Last 3 Encounters:  05/24/22 128/88  12/16/21 139/73  11/19/21 109/75   Wt Readings from Last 3 Encounters:  05/24/22 218 lb 14.4 oz (99.3 kg)  11/19/21 221 lb (100.2 kg)  07/02/21 229 lb (103.9 kg)     She was last seen for hypertension 6 months ago.  BP at that visit was 109/75. Management since that visit includes no changes.  She reports excellent compliance with treatment. She is not having side effects.  She is following a Regular diet. She is exercising. She does not smoke.  Use of agents associated with hypertension: none.   Outside blood pressures are stable. Symptoms: No chest pain No chest pressure  No palpitations No syncope  No dyspnea No orthopnea  No paroxysmal nocturnal dyspnea No lower extremity edema   Pertinent labs Lab Results  Component Value Date   CHOL 175 07/02/2021   HDL 63 07/02/2021   LDLCALC 94 07/02/2021   TRIG 103 07/02/2021   CHOLHDL 2.8 07/02/2021   Lab Results  Component Value Date   NA 143 11/19/2021   K 3.8 11/19/2021   CREATININE 1.14 (H) 11/19/2021   EGFR 52 (L) 11/19/2021   GLUCOSE 97 11/19/2021   TSH 2.650 01/09/2016     The 10-year ASCVD risk score (Arnett DK, et al., 2019) is: 9.4%  --------------------------------------------------------------------------------------------------- Prediabetes, Follow-up  Lab Results  Component Value Date   HGBA1C 5.8 (H)  11/19/2021   HGBA1C 5.6 06/05/2021   HGBA1C 6.1 (H) 11/18/2020   GLUCOSE 97 11/19/2021   GLUCOSE 111 (H) 07/02/2021   GLUCOSE 143 (H) 05/03/2021    Last seen for for this6 months ago.  Management since that visit includes no changes. Current symptoms include none and have been stable.   Pertinent Labs:    Component Value Date/Time   CHOL 175 07/02/2021 1239   TRIG 103 07/02/2021 1239   CHOLHDL 2.8 07/02/2021 1239   CREATININE 1.14 (H) 11/19/2021 1145    Wt Readings from Last 3 Encounters:  05/24/22 218 lb 14.4 oz (99.3 kg)  11/19/21 221 lb (100.2 kg)  07/02/21 229 lb (103.9 kg)    -----------------------------------------------------------------------------------------   Medications: Outpatient Medications Prior to Visit  Medication Sig   azelastine (ASTELIN) 0.1 % nasal spray Place 2 sprays into both nostrils 2 (two) times daily. Use in each nostril as directed   buPROPion (WELLBUTRIN XL) 300 MG 24 hr tablet TAKE 1 TABLET BY MOUTH EVERY DAY   Cholecalciferol 50 MCG (2000 UT) TABS Take 1 tablet by mouth daily.   fluticasone (FLONASE) 50 MCG/ACT nasal spray Place 2 sprays into both nostrils as needed.   hydrochlorothiazide (HYDRODIURIL) 25 MG tablet Take 1 tablet (25 mg total) by mouth daily.   lisinopril (ZESTRIL) 20 MG tablet TAKE 1 TABLET BY MOUTH EVERY DAY   Multiple Vitamin (MULTI-VITAMINS) TABS Take 1 tablet by mouth daily.   naltrexone (DEPADE) 50 MG tablet Take 0.5 tablets (25  mg total) by mouth daily.   valACYclovir (VALTREX) 1000 MG tablet TAKE 2 TABLETS TWICE DAILY AS NEEDED   No facility-administered medications prior to visit.    Review of Systems     Objective    BP 128/88 (BP Location: Left Arm, Patient Position: Sitting, Cuff Size: Large)   Pulse 61   Temp 98.3 F (36.8 C)   Resp 16   Wt 218 lb 14.4 oz (99.3 kg)   SpO2 97%   BMI 34.28 kg/m    Physical Exam Vitals reviewed.  Constitutional:      General: She is not in acute distress.     Appearance: Normal appearance. She is well-developed. She is not diaphoretic.  HENT:     Head: Normocephalic and atraumatic.  Eyes:     General: No scleral icterus.    Conjunctiva/sclera: Conjunctivae normal.  Neck:     Thyroid: No thyromegaly.  Cardiovascular:     Rate and Rhythm: Normal rate and regular rhythm.     Pulses: Normal pulses.     Heart sounds: Normal heart sounds. No murmur heard. Pulmonary:     Effort: Pulmonary effort is normal. No respiratory distress.     Breath sounds: Normal breath sounds. No wheezing, rhonchi or rales.  Musculoskeletal:     Cervical back: Neck supple.     Right lower leg: No edema.     Left lower leg: No edema.  Lymphadenopathy:     Cervical: No cervical adenopathy.  Skin:    General: Skin is warm and dry.     Findings: No rash.  Neurological:     Mental Status: She is alert and oriented to person, place, and time. Mental status is at baseline.  Psychiatric:        Mood and Affect: Mood normal.        Behavior: Behavior normal.       No results found for any visits on 05/24/22.  Assessment & Plan     Problem List Items Addressed This Visit       Cardiovascular and Mediastinum   Hypertension - Primary    Well controlled Continue current medications Recheck metabolic panel F/u in 6 months       Relevant Orders   Comprehensive metabolic panel   Lipid Panel With LDL/HDL Ratio     Other   Obesity    Discussed importance of healthy weight management Discussed diet and exercise       Relevant Orders   Lipid Panel With LDL/HDL Ratio   Prediabetes    Recommend low carb diet Recheck A1c       Relevant Orders   Hemoglobin A1c   Other Visit Diagnoses     Need for immunization against influenza       Relevant Orders   Flu Vaccine QUAD 51moIM (Fluarix, Fluzone & Alfiuria Quad PF)        Return in about 6 months (around 11/23/2022) for CPE, AWV.      Of note, patient received regular dose flu vaccine, not high dose  today by accident.  Discussed with patient. Will look into next steps   I, ALavon Paganini MD, have reviewed all documentation for this visit. The documentation on 05/24/22 for the exam, diagnosis, procedures, and orders are all accurate and complete.   Fama Muenchow, ADionne Bucy MD, MPH [facility]

## 2022-05-24 ENCOUNTER — Ambulatory Visit (INDEPENDENT_AMBULATORY_CARE_PROVIDER_SITE_OTHER): Payer: Medicare Other | Admitting: Family Medicine

## 2022-05-24 ENCOUNTER — Encounter: Payer: Self-pay | Admitting: Family Medicine

## 2022-05-24 VITALS — BP 128/88 | HR 61 | Temp 98.3°F | Resp 16 | Wt 218.9 lb

## 2022-05-24 DIAGNOSIS — Z6834 Body mass index (BMI) 34.0-34.9, adult: Secondary | ICD-10-CM

## 2022-05-24 DIAGNOSIS — Z23 Encounter for immunization: Secondary | ICD-10-CM | POA: Diagnosis not present

## 2022-05-24 DIAGNOSIS — I10 Essential (primary) hypertension: Secondary | ICD-10-CM | POA: Diagnosis not present

## 2022-05-24 DIAGNOSIS — E669 Obesity, unspecified: Secondary | ICD-10-CM | POA: Diagnosis not present

## 2022-05-24 DIAGNOSIS — R7303 Prediabetes: Secondary | ICD-10-CM | POA: Diagnosis not present

## 2022-05-24 NOTE — Assessment & Plan Note (Signed)
Discussed importance of healthy weight management Discussed diet and exercise  

## 2022-05-24 NOTE — Assessment & Plan Note (Signed)
Recommend low carb diet °Recheck A1c  °

## 2022-05-24 NOTE — Assessment & Plan Note (Signed)
Well controlled Continue current medications Recheck metabolic panel F/u in 6 months  

## 2022-05-25 LAB — HEMOGLOBIN A1C
Est. average glucose Bld gHb Est-mCnc: 114 mg/dL
Hgb A1c MFr Bld: 5.6 % (ref 4.8–5.6)

## 2022-05-25 LAB — COMPREHENSIVE METABOLIC PANEL
ALT: 22 IU/L (ref 0–32)
AST: 24 IU/L (ref 0–40)
Albumin/Globulin Ratio: 1.8 (ref 1.2–2.2)
Albumin: 4.2 g/dL (ref 3.9–4.9)
Alkaline Phosphatase: 66 IU/L (ref 44–121)
BUN/Creatinine Ratio: 17 (ref 12–28)
BUN: 20 mg/dL (ref 8–27)
Bilirubin Total: 0.4 mg/dL (ref 0.0–1.2)
CO2: 23 mmol/L (ref 20–29)
Calcium: 9.5 mg/dL (ref 8.7–10.3)
Chloride: 103 mmol/L (ref 96–106)
Creatinine, Ser: 1.21 mg/dL — ABNORMAL HIGH (ref 0.57–1.00)
Globulin, Total: 2.4 g/dL (ref 1.5–4.5)
Glucose: 108 mg/dL — ABNORMAL HIGH (ref 70–99)
Potassium: 3.9 mmol/L (ref 3.5–5.2)
Sodium: 140 mmol/L (ref 134–144)
Total Protein: 6.6 g/dL (ref 6.0–8.5)
eGFR: 49 mL/min/{1.73_m2} — ABNORMAL LOW (ref >59)

## 2022-05-25 LAB — LIPID PANEL WITH LDL/HDL RATIO
Cholesterol, Total: 184 mg/dL (ref 100–199)
HDL: 69 mg/dL (ref >39)
LDL Chol Calc (NIH): 98 mg/dL (ref 0–99)
LDL/HDL Ratio: 1.4 ratio (ref 0.0–3.2)
Triglycerides: 92 mg/dL (ref 0–149)
VLDL Cholesterol Cal: 17 mg/dL (ref 5–40)

## 2022-06-01 ENCOUNTER — Encounter: Payer: Self-pay | Admitting: Family Medicine

## 2022-08-05 ENCOUNTER — Encounter: Payer: Self-pay | Admitting: Family Medicine

## 2022-08-05 ENCOUNTER — Other Ambulatory Visit: Payer: Self-pay | Admitting: Family Medicine

## 2022-08-05 MED ORDER — NALTREXONE HCL 50 MG PO TABS: 25.0000 mg | ORAL_TABLET | Freq: Every day | ORAL | 3 refills | Status: DC

## 2022-08-18 ENCOUNTER — Other Ambulatory Visit: Payer: Self-pay | Admitting: Family Medicine

## 2022-08-23 ENCOUNTER — Other Ambulatory Visit: Payer: Self-pay | Admitting: Family Medicine

## 2022-08-23 ENCOUNTER — Encounter: Payer: Self-pay | Admitting: Family Medicine

## 2022-09-20 ENCOUNTER — Other Ambulatory Visit: Payer: Self-pay | Admitting: Family Medicine

## 2022-09-20 DIAGNOSIS — I10 Essential (primary) hypertension: Secondary | ICD-10-CM

## 2022-10-04 IMAGING — MG MM DIGITAL SCREENING BILAT W/ TOMO AND CAD
8 series · 8 of 24 positions shown · non-contrast
Comparison: Previous exam(s).

CLINICAL DATA: Screening.

EXAM:
DIGITAL SCREENING BILATERAL MAMMOGRAM WITH TOMOSYNTHESIS AND CAD
TECHNIQUE: Bilateral screening digital craniocaudal and mediolateral oblique
mammograms were obtained. Bilateral screening digital breast
tomosynthesis was performed. The images were evaluated with
computer-aided detection.

[R CC synth-2D]
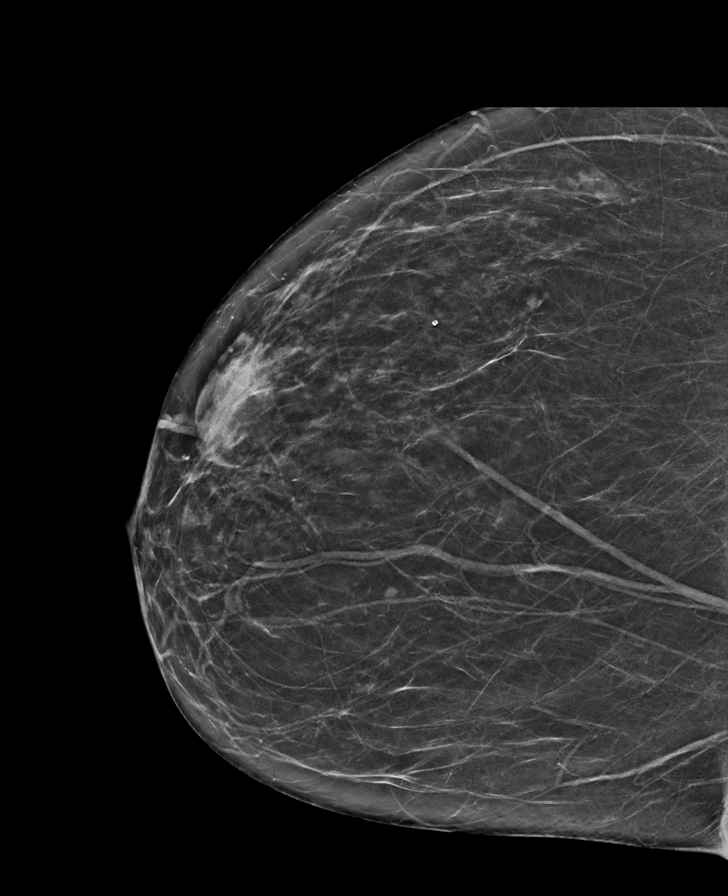

[R MLO synth-2D]
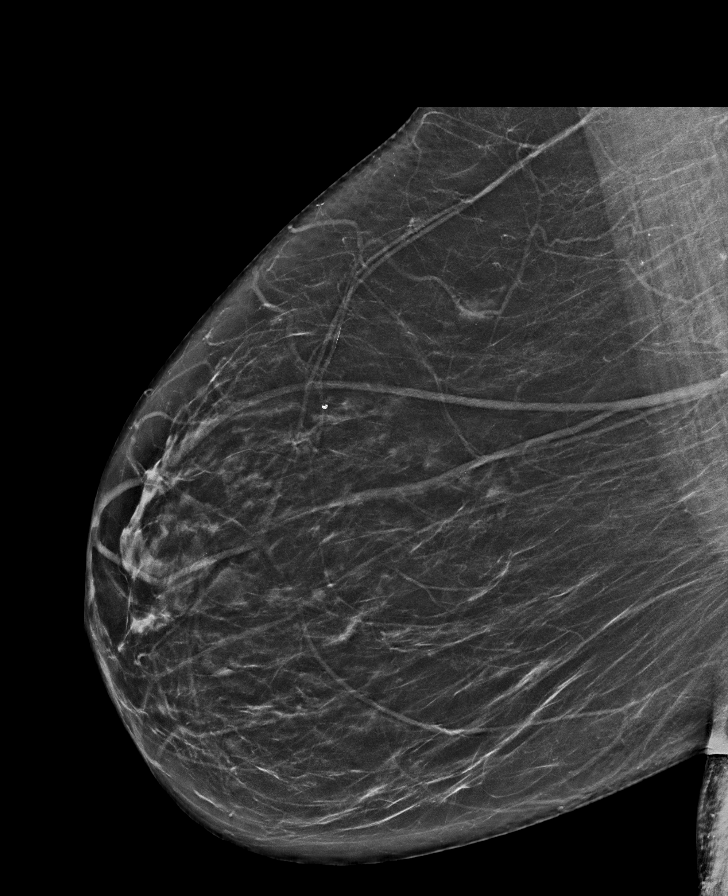

[L MLO synth-2D]
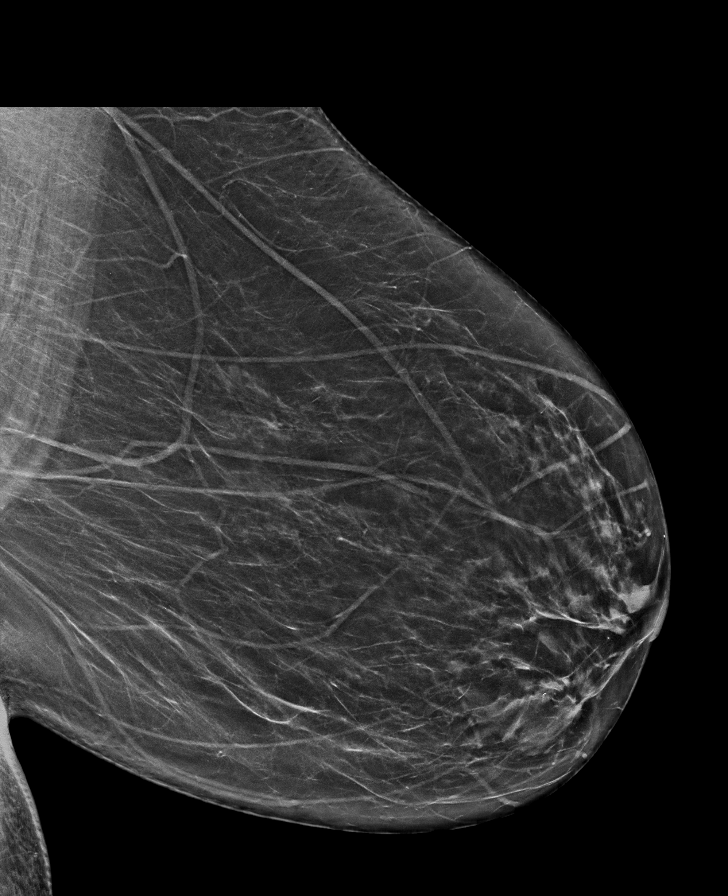

[L CC synth-2D]
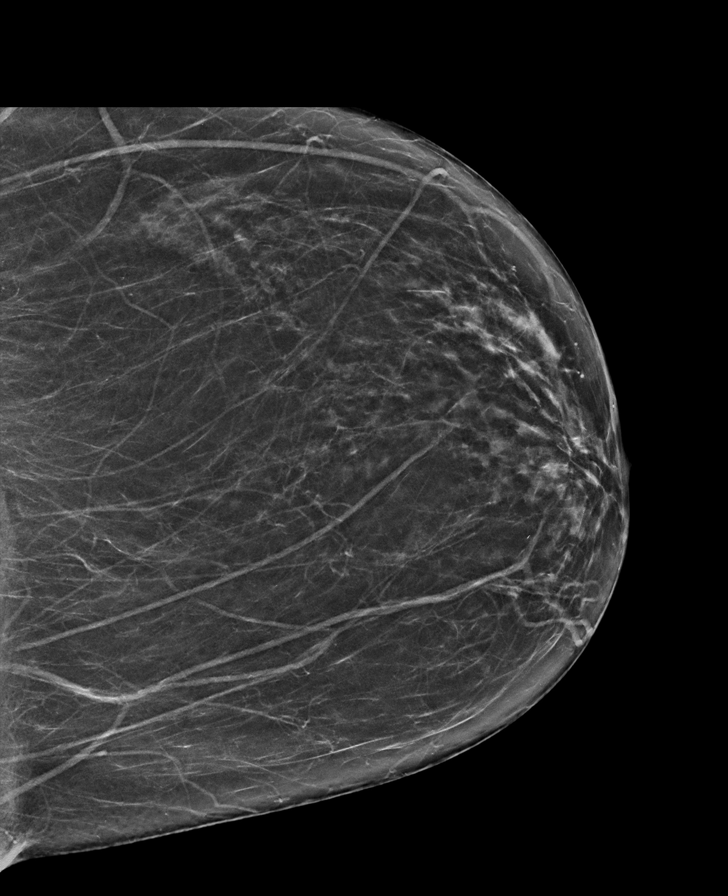

[R CC tomo · tomo slice 31/62.0]
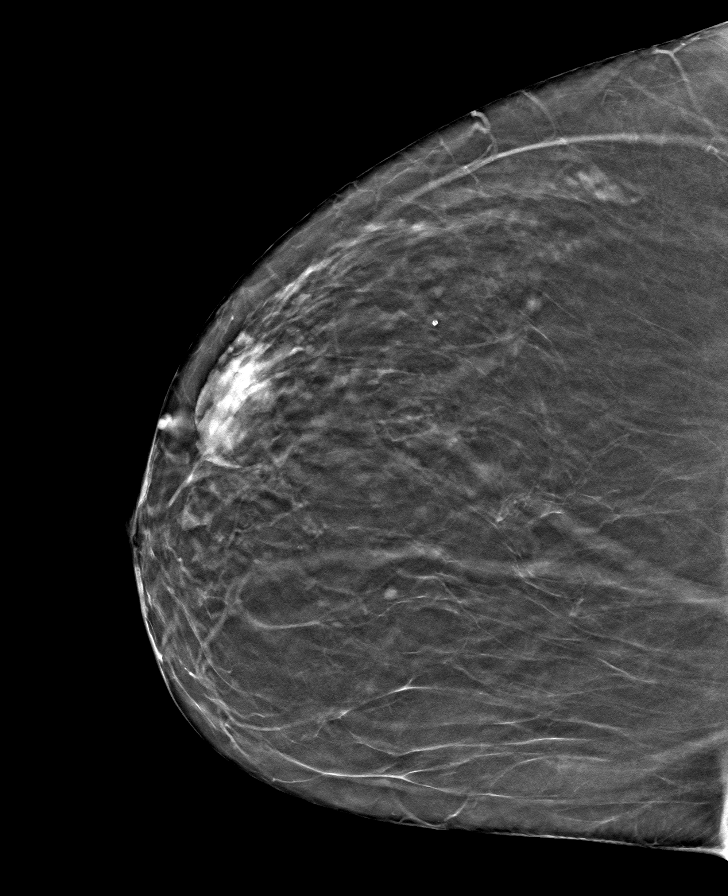

[R MLO tomo · tomo slice 35/70.0]
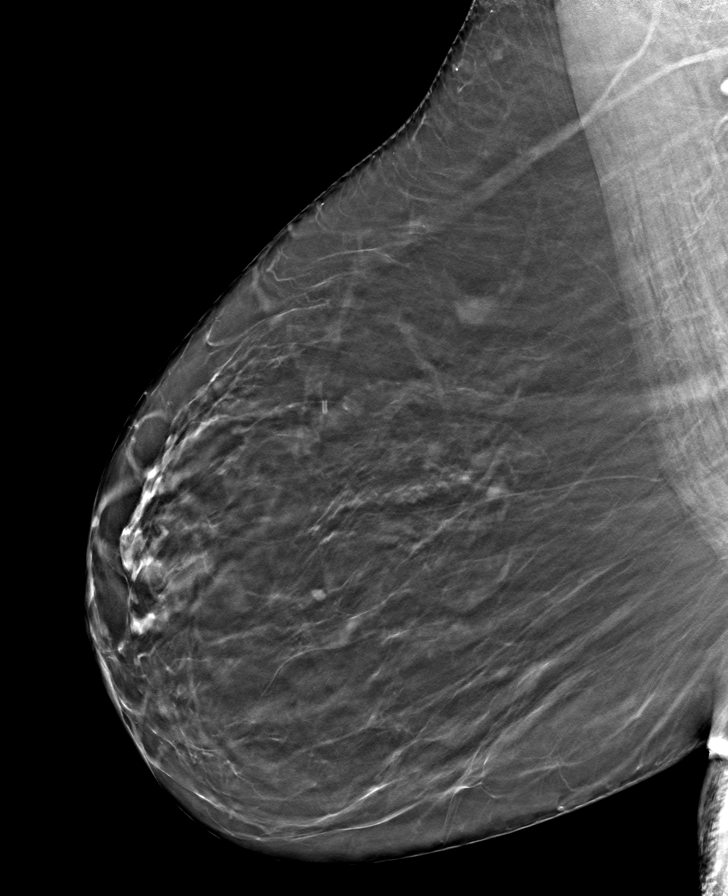

[L CC tomo · tomo slice 31/62.0]
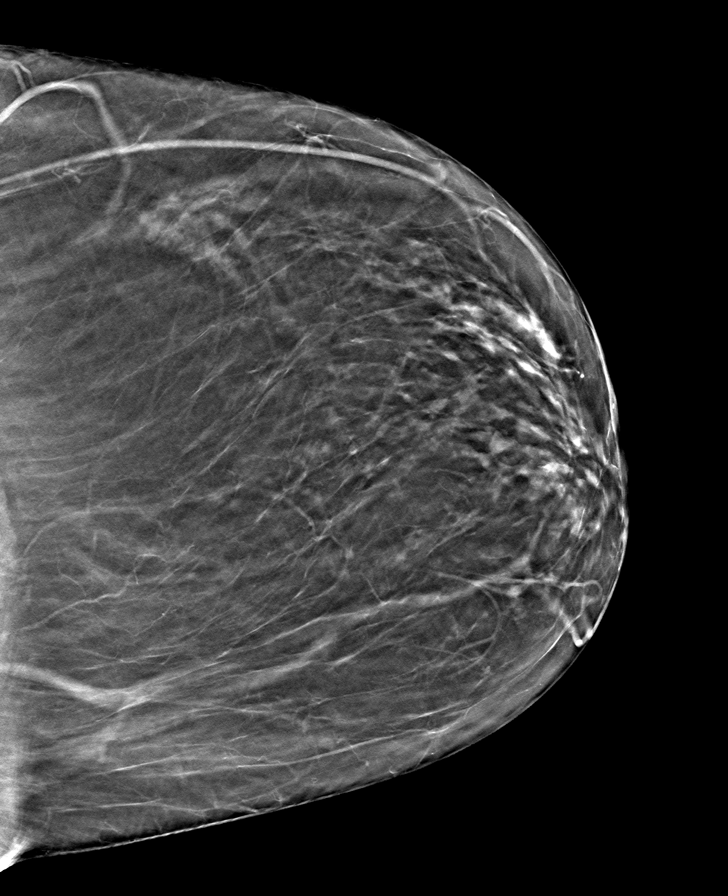

[L MLO tomo · tomo slice 35/69.0]
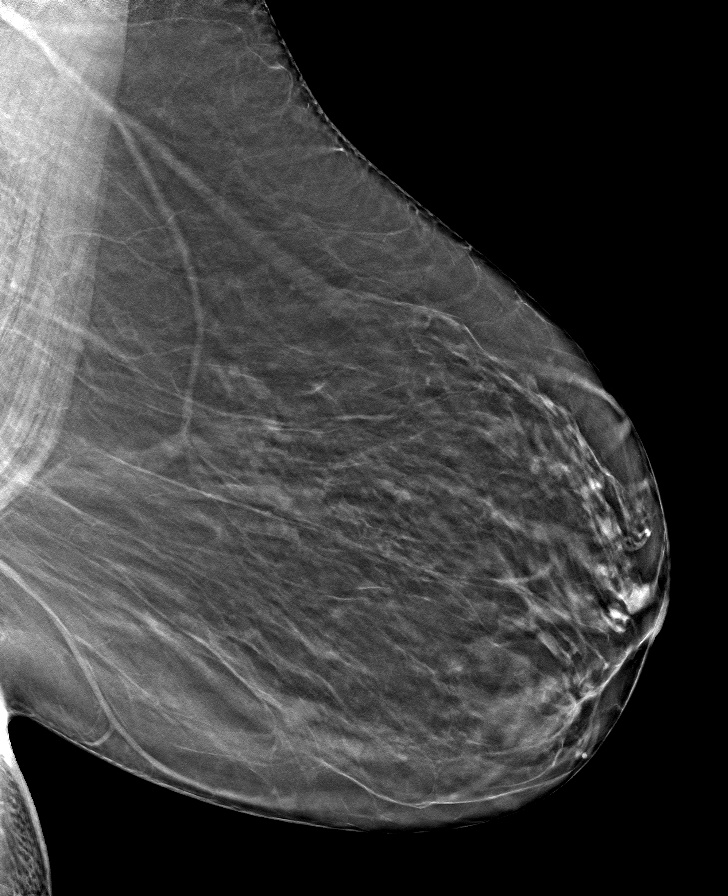

[8 of 24 positions shown; findings below may reference images not displayed]

ACR Breast Density Category b: There are scattered areas of
fibroglandular density.
FINDINGS: There are no findings suspicious for malignancy.
IMPRESSION: No mammographic evidence of malignancy. A result letter of this
screening mammogram will be mailed directly to the patient.

RECOMMENDATION:
Screening mammogram in one year. (Code:51-O-LD2)

BI-RADS CATEGORY  1: Negative.

## 2022-11-24 NOTE — Progress Notes (Signed)
I,Rita Bailey,acting as a Neurosurgeon for Rita Latch, MD.,have documented all relevant documentation on the behalf of Rita Latch, MD,as directed by  Rita Latch, MD while in the presence of Rita Latch, MD.   Annual Wellness Visit     Patient: Rita Bailey, Female    DOB: [birth year], 72 y.o.   MRN: [id number] Visit Date: 11/25/2022  Today's Provider: [name], MD   Chief Complaint  Patient presents with   Medicare Wellness   Subjective    Rita Bailey is a 72 y.o. female who presents today for her Annual Wellness Visit. She reports consuming a general diet. Home exercise routine includes yoga. Gym/ health club routine includes yoga. She generally feels well. She reports sleeping well. She does not have additional problems to discuss today.   HPI   Medications: Outpatient Medications Prior to Visit  Medication Sig   buPROPion (WELLBUTRIN XL) 300 MG 24 hr tablet TAKE 1 TABLET BY MOUTH EVERY DAY   Cholecalciferol 50 MCG (2000 UT) TABS Take 1 tablet by mouth daily.   fluticasone (FLONASE) 50 MCG/ACT nasal spray Place 2 sprays into both nostrils as needed.   hydrochlorothiazide (HYDRODIURIL) 25 MG tablet TAKE 1 TABLET (25 MG TOTAL) BY MOUTH DAILY.   lisinopril (ZESTRIL) 20 MG tablet TAKE 1 TABLET BY MOUTH EVERY DAY   Multiple Vitamin (MULTI-VITAMINS) TABS Take 1 tablet by mouth daily.   naltrexone (DEPADE) 50 MG tablet Take 0.5 tablets (25 mg total) by mouth daily.   valACYclovir (VALTREX) 1000 MG tablet TAKE 2 TABLETS TWICE DAILY AS NEEDED   [DISCONTINUED] azelastine (ASTELIN) 0.1 % nasal spray Place 2 sprays into both nostrils 2 (two) times daily. Use in each nostril as directed (Patient not taking: Reported on 11/25/2022)   No facility-administered medications prior to visit.    No Known Allergies  Patient Care Team: Erasmo Downer, MD as PCP - General (Family Medicine)  Review of Systems  HENT:  Positive for tinnitus.    Eyes:  Positive for discharge.  All other systems reviewed and are negative.       Objective    Vitals: BP 109/69 (BP Location: Left Arm, Patient Position: Sitting, Cuff Size: Large)   Pulse 64   Temp 97.6 F (36.4 C) (Temporal)   Resp 12   Ht 5\' 7"  (1.702 m)   Wt 214 lb (97.1 kg)   BMI 33.52 kg/m     Physical Exam Vitals reviewed.  Constitutional:      General: She is not in acute distress.    Appearance: Normal appearance. She is well-developed. She is not diaphoretic.  HENT:     Head: Normocephalic and atraumatic.     Right Ear: Tympanic membrane, ear canal and external ear normal.     Left Ear: Tympanic membrane, ear canal and external ear normal.     Nose: Nose normal.     Mouth/Throat:     Mouth: Mucous membranes are moist.     Pharynx: Oropharynx is clear. No oropharyngeal exudate.  Eyes:     General: No scleral icterus.    Conjunctiva/sclera: Conjunctivae normal.     Pupils: Pupils are equal, round, and reactive to light.  Neck:     Thyroid: No thyromegaly.  Cardiovascular:     Rate and Rhythm: Normal rate and regular rhythm.     Pulses: Normal pulses.     Heart sounds: Normal heart sounds. No murmur heard. Pulmonary:     Effort: Pulmonary effort is normal.  No respiratory distress.     Breath sounds: Normal breath sounds. No wheezing or rales.  Abdominal:     General: There is no distension.     Palpations: Abdomen is soft.     Tenderness: There is no abdominal tenderness.  Musculoskeletal:        General: No deformity.     Cervical back: Neck supple.     Right lower leg: No edema.     Left lower leg: No edema.  Lymphadenopathy:     Cervical: No cervical adenopathy.  Skin:    General: Skin is warm and dry.     Findings: No rash.  Neurological:     Mental Status: She is alert and oriented to person, place, and time. Mental status is at baseline.     Gait: Gait normal.  Psychiatric:        Mood and Affect: Mood normal.        Behavior: Behavior  normal.        Thought Content: Thought content normal.      Most recent functional status assessment:    11/25/2022   10:23 AM  In your present state of health, do you have any difficulty performing the following activities:  Hearing? 1  Vision? 0  Difficulty concentrating or making decisions? 0  Walking or climbing stairs? 0  Dressing or bathing? 0  Doing errands, shopping? 0   Most recent fall risk assessment:    11/25/2022   10:21 AM  Fall Risk   Falls in the past year? 0  Number falls in past yr: 0  Injury with Fall? 0  Risk for fall due to : No Fall Risks  Follow up Falls evaluation completed    Most recent depression screenings:    11/25/2022   10:23 AM 11/19/2021   11:15 AM  PHQ 2/9 Scores  PHQ - 2 Score 0 0  PHQ- 9 Score 0 0   Most recent cognitive screening:    04/16/2019    9:13 AM  6CIT Screen  What Year? 0 points  What month? 0 points  What time? 0 points  Count back from 20 0 points  Months in reverse 0 points  Repeat phrase 2 points  Total Score 2 points   Most recent Audit-C alcohol use screening    11/25/2022   10:23 AM  Alcohol Use Disorder Test (AUDIT)  1. How often do you have a drink containing alcohol? 1  2. How many drinks containing alcohol do you have on a typical day when you are drinking? 0  3. How often do you have six or more drinks on one occasion? 0  AUDIT-C Score 1   A score of 3 or more in women, and 4 or more in men indicates increased risk for alcohol abuse, EXCEPT if all of the points are from question 1   No results found for any visits on 11/25/22.  Assessment & Plan     Annual wellness visit done today including the all of the following: Reviewed patient's Family Medical History Reviewed and updated list of patient's medical providers Assessment of cognitive impairment was done Assessed patient's functional ability Established a written schedule for health screening services Health Risk Assessent Completed and  Reviewed  Exercise Activities and Dietary recommendations  Goals   None     Immunization History  Administered Date(s) Administered   Fluad Quad(high Dose 65+) 05/23/2019, 06/05/2021   Influenza, High Dose Seasonal PF 06/04/2020   Influenza,inj,Quad PF,6+  Mos 06/30/2017, 05/24/2022   Influenza-Unspecified 05/17/2015   Moderna Sars-Covid-2 Vaccination 10/10/2019, 11/09/2019, 08/13/2020   Pneumococcal Conjugate-13 11/18/2020   Pneumococcal Polysaccharide-23 04/16/2019   Td 04/16/2019   Tdap 04/29/2009   Zoster Recombinat (Shingrix) 04/12/2017, 10/12/2017    Health Maintenance  Topic Date Due   COVID-19 Vaccine (4 - 2023-24 season) 04/16/2022   INFLUENZA VACCINE  03/17/2023   MAMMOGRAM  11/11/2023   Medicare Annual Wellness (AWV)  11/25/2023   COLONOSCOPY (Pts 45-70yrs Insurance coverage will need to be confirmed)  12/17/2026   DTaP/Tdap/Td (3 - Td or Tdap) 04/15/2029   Pneumonia Vaccine 62+ Years old  Completed   DEXA SCAN  Completed   Hepatitis C Screening  Completed   Zoster Vaccines- Shingrix  Completed   HPV VACCINES  Aged Out     Discussed health benefits of physical activity, and encouraged her to engage in regular exercise appropriate for her age and condition.    Problem List Items Addressed This Visit       Cardiovascular and Mediastinum   Hypertension    Well controlled Continue current medications Recheck metabolic panel F/u in 6 months       Relevant Orders   Basic Metabolic Panel (BMET)     Other   Obesity    Discussed importance of healthy weight management Discussed diet and exercise       Prediabetes    Recommend low carb diet Recheck A1c       Relevant Orders   Hemoglobin A1c   Other Visit Diagnoses     Encounter for annual wellness visit (AWV) in Medicare patient    -  Primary   Relevant Orders   Hemoglobin A1c   Basic Metabolic Panel (BMET)   Breast cancer screening by mammogram       Relevant Orders   MM 3D SCREENING  MAMMOGRAM BILATERAL BREAST        Return in about 6 months (around 05/27/2023) for chronic disease f/u.     I, Rita Latch, MD, have reviewed all documentation for this visit. The documentation on 11/25/22 for the exam, diagnosis, procedures, and orders are all accurate and complete.   Gracin Mcpartland, Marzella Schlein, MD, MPH [facility]

## 2022-11-25 ENCOUNTER — Encounter: Payer: Self-pay | Admitting: Family Medicine

## 2022-11-25 ENCOUNTER — Ambulatory Visit (INDEPENDENT_AMBULATORY_CARE_PROVIDER_SITE_OTHER): Payer: Medicare Other | Admitting: Family Medicine

## 2022-11-25 VITALS — BP 109/69 | HR 64 | Temp 97.6°F | Resp 12 | Ht 67.0 in | Wt 214.0 lb

## 2022-11-25 DIAGNOSIS — R7303 Prediabetes: Secondary | ICD-10-CM

## 2022-11-25 DIAGNOSIS — Z0000 Encounter for general adult medical examination without abnormal findings: Secondary | ICD-10-CM

## 2022-11-25 DIAGNOSIS — Z1231 Encounter for screening mammogram for malignant neoplasm of breast: Secondary | ICD-10-CM

## 2022-11-25 DIAGNOSIS — E669 Obesity, unspecified: Secondary | ICD-10-CM

## 2022-11-25 DIAGNOSIS — I10 Essential (primary) hypertension: Secondary | ICD-10-CM

## 2022-11-25 NOTE — Assessment & Plan Note (Signed)
Recommend low carb diet °Recheck A1c  °

## 2022-11-25 NOTE — Assessment & Plan Note (Signed)
Discussed importance of healthy weight management Discussed diet and exercise  

## 2022-11-25 NOTE — Assessment & Plan Note (Signed)
Well controlled Continue current medications Recheck metabolic panel F/u in 6 months  

## 2022-11-26 LAB — BASIC METABOLIC PANEL
BUN/Creatinine Ratio: 16 (ref 12–28)
BUN: 20 mg/dL (ref 8–27)
CO2: 23 mmol/L (ref 20–29)
Calcium: 9.3 mg/dL (ref 8.7–10.3)
Chloride: 105 mmol/L (ref 96–106)
Creatinine, Ser: 1.29 mg/dL — ABNORMAL HIGH (ref 0.57–1.00)
Glucose: 105 mg/dL — ABNORMAL HIGH (ref 70–99)
Potassium: 3.8 mmol/L (ref 3.5–5.2)
Sodium: 142 mmol/L (ref 134–144)
eGFR: 45 mL/min/{1.73_m2} — ABNORMAL LOW (ref >59)

## 2022-11-26 LAB — HEMOGLOBIN A1C
Est. average glucose Bld gHb Est-mCnc: 120 mg/dL
Hgb A1c MFr Bld: 5.8 % — ABNORMAL HIGH (ref 4.8–5.6)

## 2022-12-15 ENCOUNTER — Encounter: Payer: Self-pay | Admitting: Family Medicine

## 2022-12-20 ENCOUNTER — Ambulatory Visit (INDEPENDENT_AMBULATORY_CARE_PROVIDER_SITE_OTHER): Payer: Medicare Other | Admitting: Family Medicine

## 2022-12-20 ENCOUNTER — Encounter: Payer: Self-pay | Admitting: Family Medicine

## 2022-12-20 VITALS — BP 137/73 | HR 59 | Temp 98.3°F | Resp 15 | Wt 214.0 lb

## 2022-12-20 DIAGNOSIS — M7989 Other specified soft tissue disorders: Secondary | ICD-10-CM | POA: Diagnosis not present

## 2022-12-20 DIAGNOSIS — R7303 Prediabetes: Secondary | ICD-10-CM | POA: Diagnosis not present

## 2022-12-20 DIAGNOSIS — I10 Essential (primary) hypertension: Secondary | ICD-10-CM | POA: Diagnosis not present

## 2022-12-20 MED ORDER — PREDNISONE 20 MG PO TABS: 40.0000 mg | ORAL_TABLET | Freq: Every day | ORAL | 0 refills | Status: AC

## 2022-12-20 NOTE — Assessment & Plan Note (Signed)
Patient is currently having discomfort related to her hand swelling, which may be contributing to her elevation in blood pressure; plan to re-evaluate at next visit.

## 2022-12-20 NOTE — Progress Notes (Signed)
I,Sha'taria Tyson,acting as a Neurosurgeon for [employer], DO.,have documented all relevant documentation on the behalf of [employer], DO,as directed by  [employer], DO while in the presence of Sherlyn Hay, DO.   Established patient visit   Patient: Rita Bailey   DOB: 21-Oct-1950   72 y.o. Female  MRN: [id number] Visit Date: 12/20/2022  Today's healthcare provider: Sherlyn Hay, DO   Chief Complaint  Patient presents with   Joint Swelling   Subjective    HPI  Patient is present with concerns of hand swelling and elbow pain.  Swelling bilaterally for the past week, during which time they have stayed swollen. The right side is worse than the left. Previously, she's had similar symptoms, but the swelling would resolve on its own. She notes she recently cleaned a bathroom floor by hand. The areas over the knuckles in particular also feels warm. Patient reports mainly wanting to be seen in regards to her hand edema. Symptoms become worse overnight. Of note, patient's mother also has arthritis (though the patient does not remember what type) and gout. Patient mother is being followed by rheumatology.  She had a low grade fever this weekend (100.0-100.3).  Medications: Outpatient Medications Prior to Visit  Medication Sig   buPROPion (WELLBUTRIN XL) 300 MG 24 hr tablet TAKE 1 TABLET BY MOUTH EVERY DAY   Cholecalciferol 50 MCG (2000 UT) TABS Take 1 tablet by mouth daily.   fluticasone (FLONASE) 50 MCG/ACT nasal spray Place 2 sprays into both nostrils as needed.   hydrochlorothiazide (HYDRODIURIL) 25 MG tablet TAKE 1 TABLET (25 MG TOTAL) BY MOUTH DAILY.   lisinopril (ZESTRIL) 20 MG tablet TAKE 1 TABLET BY MOUTH EVERY DAY   Multiple Vitamin (MULTI-VITAMINS) TABS Take 1 tablet by mouth daily.   naltrexone (DEPADE) 50 MG tablet Take 0.5 tablets (25 mg total) by mouth daily.   valACYclovir (VALTREX) 1000 MG tablet TAKE 2 TABLETS TWICE DAILY AS NEEDED   No  facility-administered medications prior to visit.    Review of Systems  Constitutional:  Positive for fever. Negative for chills and fatigue.       Low-grade  Respiratory:  Negative for chest tightness and shortness of breath.   Cardiovascular:  Negative for chest pain.  Gastrointestinal:  Negative for abdominal pain.  Musculoskeletal:  Positive for arthralgias and joint swelling.       Hands bilaterally, worst on right side.   Skin:  Positive for color change.       Redness of MCP joints on hands  Neurological:  Negative for light-headedness and numbness.       Objective    BP 137/73   Pulse (!) 59   Temp 98.3 F (36.8 C)   Resp 15   Wt 214 lb (97.1 kg)   SpO2 100%   BMI 33.52 kg/m    Physical Exam Vitals reviewed.  Constitutional:      General: She is not in acute distress.    Appearance: She is well-developed.  HENT:     Head: Normocephalic and atraumatic.  Eyes:     General: No scleral icterus.    Conjunctiva/sclera: Conjunctivae normal.  Cardiovascular:     Rate and Rhythm: Normal rate and regular rhythm.     Heart sounds: Normal heart sounds. No murmur heard. Pulmonary:     Effort: Pulmonary effort is normal. No respiratory distress.     Breath sounds: Normal breath sounds.  Musculoskeletal:  Right elbow: No swelling. Tenderness present.     Left elbow: No swelling. No tenderness.     Right hand: Swelling present.     Left hand: Swelling present.     Comments: Swelling with mild erythema to MCP and PIP joints of right hand, as well as 2nd and 3rd MCP joints of left hand. Swelling of MCP joints of right hand also most pronounced at 2nd and 3rd digits. Positive Bunnell sign bilaterally. Patient unable to form a fist due to the swelling and pain caused by trying to close her hands. Photos on chart; mild color change difficult to appreciate in photos. Mild tenderness present at lateral aspect of right elbow.  Skin:    General: Skin is warm and dry.      Findings: No rash.  Neurological:     Mental Status: She is alert and oriented to person, place, and time.  Psychiatric:        Behavior: Behavior normal.      No results found for any visits on 12/20/22.  Assessment & Plan     1. Bilateral hand swelling Suspect she is suffering from gout vs rheumatoid arthritis, with gout being the less likely of the two, given that the patients symptoms are bilateral and focused in the upper extremities. Will check rheumatological studies and a uric acid as noted below. Will go ahead and refer patient to rheumatology at the [facility], where her mother is currently a patient for arthritis (patient does not remember the arthritis type). - Uric acid - Rheumatoid factor - CYCLIC CITRUL PEPTIDE ANTIBODY, IGG/IGA - Sedimentation rate - C-reactive protein - ANA w/Reflex if Positive - Ambulatory referral to Rheumatology  2. Prediabetes Discussed with patient that steroids will likely result in an increase in her blood sugars and that she should exercise increased caution in maintaining a carb controlled diet while on the prednisone. She has otherwise been doing fairly well with keeping her blood sugars within an appropriate range by exercising moderation in her eating habits and food choices.   3. Primary hypertension Patient is currently having discomfort related to her hand swelling, which may be contributing to her elevation in blood pressure; plan to re-evaluate at next visit.    No follow-ups on file.      The entirety of the information documented in the History of Present Illness, Review of Systems and Physical Exam were personally obtained by me. Portions of this information were initially documented by the CMA,Sha'taria Chales Abrahams, and reviewed by me for thoroughness and accuracy.     Sherlyn Hay, DO  [facility] [phone] (phone) [phone] (fax)  [facility]

## 2022-12-20 NOTE — Assessment & Plan Note (Signed)
Discussed with patient that steroids will likely result in an increase in her blood sugars and that she should exercise increased caution in maintaining a carb controlled diet while on the prednisone. She has otherwise been doing fairly well with keeping her blood sugars within an appropriate range by exercising moderation in her eating habits and food choices.

## 2022-12-22 LAB — ANA W/REFLEX IF POSITIVE: Anti Nuclear Antibody (ANA): NEGATIVE

## 2022-12-22 LAB — CYCLIC CITRUL PEPTIDE ANTIBODY, IGG/IGA: Cyclic Citrullin Peptide Ab: 12 units (ref 0–19)

## 2022-12-22 LAB — C-REACTIVE PROTEIN: CRP: <1 mg/L (ref 0–10)

## 2022-12-22 LAB — SEDIMENTATION RATE: Sed Rate: 6 mm/hr (ref 0–40)

## 2022-12-22 LAB — URIC ACID: Uric Acid: 6.3 mg/dL (ref 3.0–7.2)

## 2022-12-22 LAB — RHEUMATOID FACTOR: Rheumatoid fact SerPl-aCnc: 10.5 IU/mL (ref <14.0)

## 2022-12-28 ENCOUNTER — Encounter: Payer: Self-pay | Admitting: Family Medicine

## 2022-12-28 ENCOUNTER — Other Ambulatory Visit: Payer: Self-pay | Admitting: Family Medicine

## 2022-12-28 ENCOUNTER — Ambulatory Visit (INDEPENDENT_AMBULATORY_CARE_PROVIDER_SITE_OTHER): Payer: Medicare Other | Admitting: Family Medicine

## 2022-12-28 DIAGNOSIS — M7989 Other specified soft tissue disorders: Secondary | ICD-10-CM

## 2022-12-28 DIAGNOSIS — I10 Essential (primary) hypertension: Secondary | ICD-10-CM

## 2022-12-28 MED ORDER — PREDNISONE 20 MG PO TABS: 20.0000 mg | ORAL_TABLET | Freq: Every day | ORAL | 0 refills | Status: DC

## 2022-12-28 NOTE — Assessment & Plan Note (Signed)
Concern for possible rheumatologic etiology Awaiting Rheum appt Resume lower dose prednisone in the meantime - 20 mg daily If able to use for 2 wks or less, will not need taper

## 2022-12-28 NOTE — Progress Notes (Signed)
      Established patient visit   Patient: Rita Bailey   DOB: [birth year]   72 y.o. Female  MRN: [id number] Visit Date: 12/28/2022  Today's healthcare provider: [name], MD   Chief Complaint  Patient presents with   Hand Pain   Subjective    HPI   Was seen by Dr Payton Mccallum on 5/6 for b/l hand swelling. It improved with prednisone and now recurrent No known new exposures New problem Mother with arthritis that she sees Dr Allena Katz (Rheum) for Patient awaiting appt with Rheum   Medications: Outpatient Medications Prior to Visit  Medication Sig   buPROPion (WELLBUTRIN XL) 300 MG 24 hr tablet TAKE 1 TABLET BY MOUTH EVERY DAY   Cholecalciferol 50 MCG (2000 UT) TABS Take 1 tablet by mouth daily.   fluticasone (FLONASE) 50 MCG/ACT nasal spray Place 2 sprays into both nostrils as needed.   hydrochlorothiazide (HYDRODIURIL) 25 MG tablet TAKE 1 TABLET (25 MG TOTAL) BY MOUTH DAILY.   lisinopril (ZESTRIL) 20 MG tablet TAKE 1 TABLET BY MOUTH EVERY DAY   Multiple Vitamin (MULTI-VITAMINS) TABS Take 1 tablet by mouth daily.   naltrexone (DEPADE) 50 MG tablet Take 0.5 tablets (25 mg total) by mouth daily.   valACYclovir (VALTREX) 1000 MG tablet TAKE 2 TABLETS TWICE DAILY AS NEEDED   No facility-administered medications prior to visit.    Review of Systems per HPI     Objective    There were no vitals taken for this visit.   Physical Exam Vitals reviewed.  Constitutional:      General: She is not in acute distress.    Appearance: She is well-developed.  HENT:     Head: Normocephalic and atraumatic.  Eyes:     General: No scleral icterus.    Conjunctiva/sclera: Conjunctivae normal.  Cardiovascular:     Rate and Rhythm: Normal rate and regular rhythm.  Pulmonary:     Effort: Pulmonary effort is normal. No respiratory distress.  Musculoskeletal:     Comments: Swelling of bilateral MCPs and PIPs with limited ROM due to swelling, some erythema and TTP around swelling.   Skin:    General: Skin is warm and dry.     Findings: No rash.  Neurological:     Mental Status: She is alert and oriented to person, place, and time.  Psychiatric:        Behavior: Behavior normal.      Reviewed Rheum labs - all normal  No results found for any visits on 12/28/22.  Assessment & Plan     Problem List Items Addressed This Visit       Other   Bilateral hand swelling - Primary    Concern for possible rheumatologic etiology Awaiting Rheum appt Resume lower dose prednisone in the meantime - 20 mg daily If able to use for 2 wks or less, will not need taper        Return if symptoms worsen or fail to improve.      I, [name], MD, have reviewed all documentation for this visit. The documentation on 12/28/22 for the exam, diagnosis, procedures, and orders are all accurate and complete.   Breleigh Carpino, Marzella Schlein, MD, MPH [facility]

## 2022-12-30 ENCOUNTER — Encounter: Payer: Self-pay | Admitting: Family Medicine

## 2022-12-30 MED ORDER — PREDNISONE 20 MG PO TABS: 20.0000 mg | ORAL_TABLET | Freq: Every day | ORAL | 0 refills | Status: AC

## 2023-02-19 ENCOUNTER — Other Ambulatory Visit: Payer: Self-pay | Admitting: Family Medicine

## 2023-03-02 ENCOUNTER — Ambulatory Visit
Admission: RE | Admit: 2023-03-02 | Discharge: 2023-03-02 | Disposition: A | Discharge status: Home / Self Care | Payer: Medicare Other | Source: Ambulatory Visit | Attending: Family Medicine | Admitting: Family Medicine

## 2023-03-02 DIAGNOSIS — Z1231 Encounter for screening mammogram for malignant neoplasm of breast: Secondary | ICD-10-CM | POA: Insufficient documentation

## 2023-05-30 ENCOUNTER — Ambulatory Visit: Payer: Medicare Other | Admitting: Family Medicine

## 2023-05-30 ENCOUNTER — Encounter: Payer: Self-pay | Admitting: Family Medicine

## 2023-05-30 VITALS — BP 105/56 | HR 71 | Temp 98.8°F | Ht 67.0 in | Wt 210.3 lb

## 2023-05-30 DIAGNOSIS — Z23 Encounter for immunization: Secondary | ICD-10-CM | POA: Diagnosis not present

## 2023-05-30 DIAGNOSIS — I10 Essential (primary) hypertension: Secondary | ICD-10-CM | POA: Diagnosis not present

## 2023-05-30 DIAGNOSIS — M069 Rheumatoid arthritis, unspecified: Secondary | ICD-10-CM

## 2023-05-30 DIAGNOSIS — Z13220 Encounter for screening for lipoid disorders: Secondary | ICD-10-CM

## 2023-05-30 DIAGNOSIS — E66811 Obesity, class 1: Secondary | ICD-10-CM

## 2023-05-30 DIAGNOSIS — R7303 Prediabetes: Secondary | ICD-10-CM

## 2023-05-30 DIAGNOSIS — Z79899 Other long term (current) drug therapy: Secondary | ICD-10-CM

## 2023-05-30 DIAGNOSIS — Z6833 Body mass index (BMI) 33.0-33.9, adult: Secondary | ICD-10-CM

## 2023-05-30 NOTE — Progress Notes (Signed)
Established Patient Office Visit  Subjective   Patient ID: Rita Bailey, female    DOB: [birth year]  Age: 72 y.o. MRN: [id number]  Chief Complaint  Patient presents with   Follow-up    6 month follow up    HPI  Discussed the use of AI scribe software for clinical note transcription with the patient, who gave verbal consent to proceed.  History of Present Illness   The patient presents for a routine six-month follow-up, primarily for blood pressure management and rheumatoid arthritis. She reports that her blood pressure is well-controlled on her current regimen of hydrochlorothiazide 25mg  and lisinopril 20mg , with no symptoms of lightheadedness or dizziness.  Regarding her rheumatoid arthritis, she is currently on methotrexate 2.5mg  (five tablets once a week) and folic acid 1mg . She reports that she can feel the effects of the methotrexate wearing off towards the end of the week, with increased symptoms in her hands and wrists. However, she is reluctant to increase the dosage due to the associated nausea and her general dislike for medications.  The patient also mentions a wax buildup in her ears, which she recently had to clean out due to discomfort.         ROS    Objective:     BP (!) 105/56 (BP Location: Left Arm, Patient Position: Sitting, Cuff Size: Large)   Pulse 71   Temp 98.8 F (37.1 C) (Oral)   Ht 5\' 7"  (1.702 m)   Wt 210 lb 4.8 oz (95.4 kg)   SpO2 97%   BMI 32.94 kg/m    Physical Exam Vitals reviewed.  Constitutional:      General: She is not in acute distress.    Appearance: Normal appearance. She is well-developed. She is not diaphoretic.  HENT:     Head: Normocephalic and atraumatic.  Eyes:     General: No scleral icterus.    Conjunctiva/sclera: Conjunctivae normal.  Neck:     Thyroid: No thyromegaly.  Cardiovascular:     Rate and Rhythm: Normal rate and regular rhythm.     Heart sounds: Normal heart sounds. No murmur heard. Pulmonary:      Effort: Pulmonary effort is normal. No respiratory distress.     Breath sounds: Normal breath sounds. No wheezing, rhonchi or rales.  Musculoskeletal:     Cervical back: Neck supple.     Right lower leg: No edema.     Left lower leg: No edema.  Lymphadenopathy:     Cervical: No cervical adenopathy.  Skin:    General: Skin is warm and dry.     Findings: No rash.  Neurological:     Mental Status: She is alert and oriented to person, place, and time. Mental status is at baseline.  Psychiatric:        Mood and Affect: Mood normal.        Behavior: Behavior normal.      No results found for any visits on 05/30/23.    The 10-year ASCVD risk score (Arnett DK, et al., 2019) is: 7.1%    Assessment & Plan:   Problem List Items Addressed This Visit       Cardiovascular and Mediastinum   Hypertension - Primary    Well controlled on current regimen of Hydrochlorothiazide 25mg  and Lisinopril 20mg  daily. No symptoms of lightheadedness or dizziness. -Continue current medications.      Relevant Orders   Comprehensive metabolic panel     Musculoskeletal and Integument   Rheumatoid arthritis (HCC)  On Methotrexate 2.5mg  (5 tablets once a week) and Folic Acid 1mg . Reports feeling nauseated after taking Methotrexate and can feel it wearing off towards the end of the week. No desire to increase dosage. -Continue current medications. F/b Rheum - Dr Allena Katz      Relevant Medications   methotrexate (RHEUMATREX) 2.5 MG tablet     Other   Obesity    Discussed importance of healthy weight management Discussed diet and exercise       Prediabetes    Recommend low carb diet Recheck A1c       Relevant Orders   Hemoglobin A1c   Other Visit Diagnoses     Screening for lipid disorders       Relevant Orders   Comprehensive metabolic panel   Lipid panel   Long-term use of high-risk medication       Relevant Orders   CBC w/Diff/Platelet           General Health  Maintenance -Order labs to check kidney and liver function, blood sugar, cholesterol, and blood counts. -Administer influenza vaccine today. -Schedule wellness visit in six months.       Return in about 6 months (around 11/28/2023) for AWV.    [name], MD

## 2023-05-30 NOTE — Assessment & Plan Note (Signed)
Well controlled on current regimen of Hydrochlorothiazide 25mg  and Lisinopril 20mg  daily. No symptoms of lightheadedness or dizziness. -Continue current medications.

## 2023-05-30 NOTE — Assessment & Plan Note (Signed)
On Methotrexate 2.5mg  (5 tablets once a week) and Folic Acid 1mg . Reports feeling nauseated after taking Methotrexate and can feel it wearing off towards the end of the week. No desire to increase dosage. -Continue current medications. F/b Rheum - Dr Allena Katz

## 2023-05-30 NOTE — Assessment & Plan Note (Signed)
Recommend low carb diet °Recheck A1c  °

## 2023-05-30 NOTE — Assessment & Plan Note (Signed)
Discussed importance of healthy weight management Discussed diet and exercise  

## 2023-05-30 NOTE — Addendum Note (Signed)
Addended by: Janey Greaser D on: 05/30/2023 12:25 PM   Modules accepted: Orders

## 2023-05-31 ENCOUNTER — Other Ambulatory Visit: Payer: Self-pay

## 2023-05-31 ENCOUNTER — Encounter: Payer: Self-pay | Admitting: Family Medicine

## 2023-05-31 DIAGNOSIS — R7303 Prediabetes: Secondary | ICD-10-CM

## 2023-05-31 DIAGNOSIS — I10 Essential (primary) hypertension: Secondary | ICD-10-CM

## 2023-05-31 LAB — CBC WITH DIFFERENTIAL/PLATELET
Basophils Absolute: 0.1 10*3/uL (ref 0.0–0.2)
Basos: 1 %
EOS (ABSOLUTE): 0.2 10*3/uL (ref 0.0–0.4)
Eos: 2 %
Hematocrit: 45.8 % (ref 34.0–46.6)
Hemoglobin: 15.6 g/dL (ref 11.1–15.9)
Immature Grans (Abs): 0 10*3/uL (ref 0.0–0.1)
Immature Granulocytes: 0 %
Lymphocytes Absolute: 1.6 10*3/uL (ref 0.7–3.1)
Lymphs: 21 %
MCH: 31.7 pg (ref 26.6–33.0)
MCHC: 34.1 g/dL (ref 31.5–35.7)
MCV: 93 fL (ref 79–97)
Monocytes Absolute: 0.5 10*3/uL (ref 0.1–0.9)
Monocytes: 7 %
Neutrophils Absolute: 5.4 10*3/uL (ref 1.4–7.0)
Neutrophils: 69 %
Platelets: 201 10*3/uL (ref 150–450)
RBC: 4.92 x10E6/uL (ref 3.77–5.28)
RDW: 12.7 % (ref 11.7–15.4)
WBC: 7.8 10*3/uL (ref 3.4–10.8)

## 2023-05-31 LAB — COMPREHENSIVE METABOLIC PANEL
ALT: 19 [IU]/L (ref 0–32)
AST: 21 [IU]/L (ref 0–40)
Albumin: 4.2 g/dL (ref 3.9–4.9)
Alkaline Phosphatase: 71 [IU]/L (ref 44–121)
BUN/Creatinine Ratio: 16 (ref 12–28)
BUN: 22 mg/dL (ref 8–27)
Bilirubin Total: 0.7 mg/dL (ref 0.0–1.2)
CO2: 23 mmol/L (ref 20–29)
Calcium: 9.6 mg/dL (ref 8.7–10.3)
Chloride: 105 mmol/L (ref 96–106)
Creatinine, Ser: 1.37 mg/dL — ABNORMAL HIGH (ref 0.57–1.00)
Globulin, Total: 2.4 g/dL (ref 1.5–4.5)
Glucose: 105 mg/dL — ABNORMAL HIGH (ref 70–99)
Potassium: 3.8 mmol/L (ref 3.5–5.2)
Sodium: 143 mmol/L (ref 134–144)
Total Protein: 6.6 g/dL (ref 6.0–8.5)
eGFR: 42 mL/min/{1.73_m2} — ABNORMAL LOW (ref >59)

## 2023-05-31 LAB — HEMOGLOBIN A1C
Est. average glucose Bld gHb Est-mCnc: 111 mg/dL
Hgb A1c MFr Bld: 5.5 % (ref 4.8–5.6)

## 2023-05-31 LAB — LIPID PANEL
Chol/HDL Ratio: 2.4 {ratio} (ref 0.0–4.4)
Cholesterol, Total: 192 mg/dL (ref 100–199)
HDL: 79 mg/dL (ref >39)
LDL Chol Calc (NIH): 95 mg/dL (ref 0–99)
Triglycerides: 100 mg/dL (ref 0–149)
VLDL Cholesterol Cal: 18 mg/dL (ref 5–40)

## 2023-06-02 LAB — MICROALBUMIN / CREATININE URINE RATIO
Creatinine, Urine: 159.7 mg/dL
Microalb/Creat Ratio: 6 mg/g{creat} (ref 0–29)
Microalbumin, Urine: 9 ug/mL

## 2023-07-26 ENCOUNTER — Other Ambulatory Visit: Payer: Self-pay | Admitting: Family Medicine

## 2023-07-26 ENCOUNTER — Encounter: Payer: Self-pay | Admitting: Family Medicine

## 2023-07-26 MED ORDER — NALTREXONE HCL 50 MG PO TABS: 25.0000 mg | ORAL_TABLET | Freq: Every day | ORAL | 3 refills | Status: DC

## 2023-08-17 ENCOUNTER — Other Ambulatory Visit: Payer: Self-pay | Admitting: Family Medicine

## 2023-09-08 ENCOUNTER — Encounter: Payer: Self-pay | Admitting: Family Medicine

## 2023-09-09 NOTE — Telephone Encounter (Signed)
duplicate

## 2023-09-10 ENCOUNTER — Other Ambulatory Visit: Payer: Self-pay | Admitting: Family Medicine

## 2023-09-10 DIAGNOSIS — I10 Essential (primary) hypertension: Secondary | ICD-10-CM

## 2023-09-12 NOTE — Telephone Encounter (Signed)
Requested Prescriptions  Pending Prescriptions Disp Refills   hydrochlorothiazide (HYDRODIURIL) 25 MG tablet [Pharmacy Med Name: HYDROCHLOROTHIAZIDE 25 MG TAB] 90 tablet 0    Sig: TAKE 1 TABLET (25 MG TOTAL) BY MOUTH DAILY.     Cardiovascular: Diuretics - Thiazide Failed - 09/12/2023  2:17 PM      Failed - Cr in normal range and within 180 days    Creatinine, Ser  Date Value Ref Range Status  05/30/2023 1.37 (H) 0.57 - 1.00 mg/dL Final         Passed - K in normal range and within 180 days    Potassium  Date Value Ref Range Status  05/30/2023 3.8 3.5 - 5.2 mmol/L Final         Passed - Na in normal range and within 180 days    Sodium  Date Value Ref Range Status  05/30/2023 143 134 - 144 mmol/L Final         Passed - Last BP in normal range    BP Readings from Last 1 Encounters:  05/30/23 (!) 105/56         Passed - Valid encounter within last 6 months    Recent Outpatient Visits           3 months ago Primary hypertension   [facility] [place], Marzella Schlein, MD   8 months ago Bilateral hand swelling   [facility] [place], Marzella Schlein, MD   8 months ago Bilateral hand swelling   [facility] [place], Monico Blitz, DO   9 months ago Encounter for annual wellness visit (AWV) in Medicare patient   [facility] [place], Marzella Schlein, MD   1 year ago Primary hypertension    [facility] [place], Marzella Schlein, MD

## 2023-11-18 ENCOUNTER — Other Ambulatory Visit: Payer: Self-pay | Admitting: Family Medicine

## 2023-11-18 NOTE — Telephone Encounter (Signed)
 Last OV 05/30/23 Requested Prescriptions  Pending Prescriptions Disp Refills   buPROPion (WELLBUTRIN XL) 300 MG 24 hr tablet [Pharmacy Med Name: BUPROPION HCL XL 300 MG TABLET] 90 tablet 0    Sig: TAKE 1 TABLET BY MOUTH EVERY DAY     Psychiatry: Antidepressants - bupropion Failed - 11/18/2023  3:27 PM      Failed - Cr in normal range and within 360 days    Creatinine, Ser  Date Value Ref Range Status  05/30/2023 1.37 (H) 0.57 - 1.00 mg/dL Final         Failed - Valid encounter within last 6 months    Recent Outpatient Visits   None            Passed - AST in normal range and within 360 days    AST  Date Value Ref Range Status  05/30/2023 21 0 - 40 IU/L Final         Passed - ALT in normal range and within 360 days    ALT  Date Value Ref Range Status  05/30/2023 19 0 - 32 IU/L Final         Passed - Last BP in normal range    BP Readings from Last 1 Encounters:  05/30/23 (!) 105/56

## 2023-12-11 ENCOUNTER — Other Ambulatory Visit: Payer: Self-pay | Admitting: Family Medicine

## 2023-12-13 ENCOUNTER — Encounter: Payer: Self-pay | Admitting: Family Medicine

## 2023-12-13 ENCOUNTER — Other Ambulatory Visit: Payer: Self-pay | Admitting: Family Medicine

## 2023-12-13 ENCOUNTER — Ambulatory Visit (INDEPENDENT_AMBULATORY_CARE_PROVIDER_SITE_OTHER): Payer: Self-pay | Admitting: Family Medicine

## 2023-12-13 VITALS — BP 130/78 | HR 59 | Ht 67.0 in | Wt 204.7 lb

## 2023-12-13 DIAGNOSIS — Z0000 Encounter for general adult medical examination without abnormal findings: Secondary | ICD-10-CM

## 2023-12-13 DIAGNOSIS — Z6832 Body mass index (BMI) 32.0-32.9, adult: Secondary | ICD-10-CM

## 2023-12-13 DIAGNOSIS — Z0001 Encounter for general adult medical examination with abnormal findings: Secondary | ICD-10-CM | POA: Diagnosis not present

## 2023-12-13 DIAGNOSIS — Z79899 Other long term (current) drug therapy: Secondary | ICD-10-CM

## 2023-12-13 DIAGNOSIS — M069 Rheumatoid arthritis, unspecified: Secondary | ICD-10-CM

## 2023-12-13 DIAGNOSIS — I10 Essential (primary) hypertension: Secondary | ICD-10-CM

## 2023-12-13 DIAGNOSIS — E66811 Obesity, class 1: Secondary | ICD-10-CM | POA: Diagnosis not present

## 2023-12-13 DIAGNOSIS — R269 Unspecified abnormalities of gait and mobility: Secondary | ICD-10-CM

## 2023-12-13 DIAGNOSIS — R7303 Prediabetes: Secondary | ICD-10-CM | POA: Diagnosis not present

## 2023-12-13 DIAGNOSIS — Z1231 Encounter for screening mammogram for malignant neoplasm of breast: Secondary | ICD-10-CM

## 2023-12-13 MED ORDER — LISINOPRIL 20 MG PO TABS: 20.0000 mg | ORAL_TABLET | Freq: Every day | ORAL | 3 refills | Status: AC

## 2023-12-13 MED ORDER — BUPROPION HCL ER (XL) 300 MG PO TB24: 300.0000 mg | ORAL_TABLET | Freq: Every day | ORAL | 3 refills | Status: AC

## 2023-12-13 MED ORDER — NALTREXONE HCL 50 MG PO TABS: 25.0000 mg | ORAL_TABLET | Freq: Every day | ORAL | 3 refills | Status: AC

## 2023-12-13 MED ORDER — HYDROCHLOROTHIAZIDE 25 MG PO TABS: 25.0000 mg | ORAL_TABLET | Freq: Every day | ORAL | 3 refills | Status: AC

## 2023-12-13 NOTE — Progress Notes (Signed)
 Annual Wellness Visit     Patient: Rita Bailey, Female    DOB: 11/27/1950, 73 y.o.   MRN: [id number] Visit Date: 12/13/2023  Today's Provider: Aden Agreste, MD   Chief Complaint  Patient presents with   Annual Exam    Last completed 11/25/22 Diet -  general, well balanced Exercise - not as much due to pain when walking but will try every other day to go walk and care provider for 3 people Feeling - well Sleeping - well Concerns - nonw    Subjective    Rita Bailey is a 73 y.o. female who presents today for her Annual Wellness Visit.   Discussed the use of AI scribe software for clinical note transcription with the patient, who gave verbal consent to proceed.  History of Present Illness   Rita CARLSTEDT "Russ Course" is a 73 year old female who presents for an annual wellness visit.  She reports overall well-being with no major concerns. She experiences occasional blurry vision, likely due to dryness, and plans to schedule an eye examination.  Hypertension is managed with hydrochlorothiazide  25 mg daily and lisinopril  20 mg daily. She has prediabetes and takes Wellbutrin  XL 300 mg daily and naltrexone  25 mg daily, which also aids in weight management. She takes these medications at night to avoid insomnia.  Rheumatoid arthritis is managed with methotrexate, effectively controlling symptoms despite occasional pain.  She experienced significant weight loss, losing eight pounds in one week due to a suspected norovirus infection, and has maintained this weight loss with an additional two-pound loss.  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 She has not pursued physical therapy but manages her gait by walking slower.  Family history includes a mother with a brain aneurysm and a cousin with liver function issues and cancer.             Medications: Outpatient Medications Prior to Visit   Medication Sig   Cholecalciferol 50 MCG (2000 UT) TABS Take 1 tablet by mouth daily.   fluticasone  (FLONASE ) 50 MCG/ACT nasal spray Place 2 sprays into both nostrils as needed.   folic acid (FOLVITE) 1 MG tablet Take 1 mg by mouth daily.   methotrexate (RHEUMATREX) 2.5 MG tablet Take 12.5 mg by mouth once a week.   Multiple Vitamin (MULTI-VITAMINS) TABS Take 1 tablet by mouth daily.   valACYclovir  (VALTREX ) 1000 MG tablet TAKE 2 TABLETS TWICE DAILY AS NEEDED   [DISCONTINUED] buPROPion  (WELLBUTRIN  XL) 300 MG 24 hr tablet TAKE 1 TABLET BY MOUTH EVERY DAY   [DISCONTINUED] hydrochlorothiazide  (HYDRODIURIL ) 25 MG tablet TAKE 1 TABLET (25 MG TOTAL) BY MOUTH DAILY.   [DISCONTINUED] lisinopril  (ZESTRIL ) 20 MG tablet TAKE 1 TABLET BY MOUTH EVERY DAY   [DISCONTINUED] naltrexone  (DEPADE) 50 MG tablet Take 0.5 tablets (25 mg total) by mouth daily.   No facility-administered medications prior to visit.    No Known Allergies  Patient Care Team: Mazie Speed, MD as PCP - General (Family Medicine)  Review of Systems       Objective    Vitals: BP 130/78 (BP Location: Left Arm, Patient Position: Sitting, Cuff Size: Large)   Pulse (!) 59   Ht 5\' 7"  (1.702 m)   Wt 204 lb 11.2 oz (92.9 kg)   SpO2 99%   BMI 32.06 kg/m      Physical Exam Vitals reviewed.  Constitutional:      General: She is not  in acute distress.    Appearance: Normal appearance. She is well-developed. She is not diaphoretic.  HENT:     Head: Normocephalic and atraumatic.     Right Ear: Tympanic membrane, ear canal and external ear normal.     Left Ear: Tympanic membrane, ear canal and external ear normal.     Nose: Nose normal.     Mouth/Throat:     Mouth: Mucous membranes are moist.     Pharynx: Oropharynx is clear. No oropharyngeal exudate.  Eyes:     General: No scleral icterus.    Conjunctiva/sclera: Conjunctivae normal.     Pupils: Pupils are equal, round, and reactive to light.  Neck:     Thyroid: No  thyromegaly.  Cardiovascular:     Rate and Rhythm: Normal rate and regular rhythm.     Heart sounds: Normal heart sounds. No murmur heard. Pulmonary:     Effort: Pulmonary effort is normal. No respiratory distress.     Breath sounds: Normal breath sounds. No wheezing or rales.  Abdominal:     General: There is no distension.     Palpations: Abdomen is soft.     Tenderness: There is no abdominal tenderness.  Musculoskeletal:        General: No deformity.     Cervical back: Neck supple.     Right lower leg: No edema.     Left lower leg: No edema.  Lymphadenopathy:     Cervical: No cervical adenopathy.  Skin:    General: Skin is warm and dry.     Findings: No rash.  Neurological:     Mental Status: She is alert and oriented to person, place, and time. Mental status is at baseline.     Gait: Gait abnormal.  Psychiatric:        Mood and Affect: Mood normal.        Behavior: Behavior normal.        Thought Content: Thought content normal.     Most recent functional status assessment:    12/13/2023    9:40 AM  In your present state of health, do you have any difficulty performing the following activities:  Hearing? 1  Vision? 1  Difficulty concentrating or making decisions? 0  Walking or climbing stairs? 0  Dressing or bathing? 0  Doing errands, shopping? 0  Preparing Food and eating ? N  Using the Toilet? N  In the past six months, have you accidently leaked urine? N  Do you have problems with loss of bowel control? N  Managing your Medications? N  Managing your Finances? N  Housekeeping or managing your Housekeeping? N   Most recent fall risk assessment:    12/13/2023    9:45 AM  Fall Risk   Falls in the past year? 0  Number falls in past yr: 0  Injury with Fall? 0  Risk for fall due to : No Fall Risks  Follow up Falls evaluation completed    Most recent depression screenings:    12/13/2023    9:46 AM 05/30/2023   10:59 AM  PHQ 2/9 Scores  PHQ - 2 Score 0 0    Most recent cognitive screening:    12/13/2023    9:46 AM  6CIT Screen  What Year? 0 points  What month? 0 points  What time? 0 points  Count back from 20 0 points  Months in reverse 0 points   Most recent Audit-C alcohol use screening    12/20/2022  10:41 AM  Alcohol Use Disorder Test (AUDIT)  1. How often do you have a drink containing alcohol? 1  2. How many drinks containing alcohol do you have on a typical day when you are drinking? 0  3. How often do you have six or more drinks on one occasion? 0  AUDIT-C Score 1   A score of 3 or more in women, and 4 or more in men indicates increased risk for alcohol abuse, EXCEPT if all of the points are from question 1   No results found.  No results found for any visits on 12/13/23.  Assessment & Plan     Annual wellness visit done today including the all of the following: Reviewed patient's Family Medical History Reviewed and updated list of patient's medical providers Assessment of cognitive impairment was done Assessed patient's functional ability Established a written schedule for health screening services Health Risk Assessent Completed and Reviewed  Exercise Activities and Dietary recommendations  Goals   None     Immunization History  Administered Date(s) Administered   Fluad Quad(high Dose 65+) 05/23/2019, 06/05/2021   Fluad Trivalent(High Dose 65+) 05/30/2023   Influenza, High Dose Seasonal PF 06/04/2020   Influenza,inj,Quad PF,6+ Mos 06/30/2017, 05/24/2022   Influenza-Unspecified 05/17/2015   Moderna Sars-Covid-2 Vaccination 10/10/2019, 11/09/2019, 08/13/2020   Pneumococcal Conjugate-13 11/18/2020   Pneumococcal Polysaccharide-23 04/16/2019   Td 04/16/2019   Tdap 04/29/2009   Zoster Recombinant(Shingrix ) 04/12/2017, 10/12/2017    Health Maintenance  Topic Date Due   COVID-19 Vaccine (4 - 2024-25 season) 04/17/2023   INFLUENZA VACCINE  03/16/2024   Medicare Annual Wellness (AWV)  12/12/2024    MAMMOGRAM  03/01/2025   Colonoscopy  12/17/2026   DTaP/Tdap/Td (3 - Td or Tdap) 04/15/2029   Pneumonia Vaccine 87+ Years old  Completed   DEXA SCAN  Completed   Hepatitis C Screening  Completed   Zoster Vaccines- Shingrix   Completed   HPV VACCINES  Aged Out   Meningococcal B Vaccine  Aged Out     Discussed health benefits of physical activity, and encouraged her to engage in regular exercise appropriate for her age and condition.    Problem List Items Addressed This Visit       Cardiovascular and Mediastinum   Hypertension   Relevant Medications   hydrochlorothiazide  (HYDRODIURIL ) 25 MG tablet   lisinopril  (ZESTRIL ) 20 MG tablet   Other Relevant Orders   Comprehensive metabolic panel with GFR   Lipid panel     Musculoskeletal and Integument   Rheumatoid arthritis (HCC)   Relevant Orders   CBC w/Diff/Platelet   Hepatitis B Surface AntiBODY   Hepatitis B Surface AntiGEN   Hepatitis B Core Antibody, total   Hepatitis C Antibody   Rheumatoid Factor   Sed Rate (ESR)   C-reactive protein     Other   Obesity   Relevant Orders   Comprehensive metabolic panel with GFR   Lipid panel   Hemoglobin A1c   Prediabetes   Relevant Orders   Comprehensive metabolic panel with GFR   Lipid panel   Hemoglobin A1c   Other Visit Diagnoses       Encounter for annual wellness visit (AWV) in Medicare patient    -  Primary     Breast cancer screening by mammogram       Relevant Orders   MM 3D SCREENING MAMMOGRAM BILATERAL BREAST     Long-term use of high-risk medication       Relevant Orders   CBC w/Diff/Platelet  Hepatitis B Surface AntiBODY   Hepatitis B Surface AntiGEN   Hepatitis B Core Antibody, total   Hepatitis C Antibody   Comprehensive metabolic panel with GFR   Lipid panel   Rheumatoid Factor   Sed Rate (ESR)   C-reactive protein   Hemoglobin A1c     Abnormal gait       Relevant Orders   Ambulatory referral to Physical Therapy           Wellness  Visit Annual wellness visit with no acute concerns. Mammogram due in July. Colonoscopy not due until 2028. Intermittent blurry vision possibly related to dryness or cataracts. RSV vaccine recommended due to potential exposure risks, well tolerated with minimal side effects reported. 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 Previous recommendation for physical therapy was not followed through due to logistical issues. - Refer to physical therapy for hip strengthening exercises  Rheumatoid arthritis Rheumatoid arthritis managed by rheumatology with methotrexate. Occasional pain reported but no swelling. Labs to monitor inflammation, liver, and kidney function are due. Hepatitis B and C screening recommended due to methotrexate use. - Order labs including inflammation markers, liver and kidney function tests, and blood counts - Include hepatitis B and C screening in labs  Hypertension Hypertension managed with hydrochlorothiazide  25 mg daily and lisinopril  20 mg daily.  Prediabetes Prediabetes monitored. A1c will be checked as part of routine labs. - Order A1c test  Weight management with Wellbutrin  and naltrexone  Weight management ongoing with Wellbutrin  XL 300 mg and naltrexone  25 mg, both taken at night. Effective without causing insomnia.       Return in about 6 months (around 06/13/2024) for chronic disease f/u.     Aden Agreste, MD  [facility] [phone] (phone) [phone] (fax)  [facility]

## 2023-12-13 NOTE — Patient Instructions (Signed)
 Call [facility] to schedule a mammogram [phone]

## 2023-12-14 ENCOUNTER — Encounter: Payer: Self-pay | Admitting: Family Medicine

## 2023-12-14 LAB — SEDIMENTATION RATE: Sed Rate: 9 mm/h (ref 0–40)

## 2023-12-14 LAB — COMPREHENSIVE METABOLIC PANEL WITH GFR
ALT: 17 IU/L (ref 0–32)
AST: 21 IU/L (ref 0–40)
Albumin: 4.1 g/dL (ref 3.9–4.9)
Alkaline Phosphatase: 69 IU/L (ref 44–121)
BUN/Creatinine Ratio: 19 (ref 12–28)
BUN: 25 mg/dL (ref 8–27)
Bilirubin Total: 0.6 mg/dL (ref 0.0–1.2)
CO2: 23 mmol/L (ref 20–29)
Calcium: 9.6 mg/dL (ref 8.7–10.3)
Chloride: 103 mmol/L (ref 96–106)
Creatinine, Ser: 1.31 mg/dL — ABNORMAL HIGH (ref 0.57–1.00)
Globulin, Total: 2.1 g/dL (ref 1.5–4.5)
Glucose: 99 mg/dL (ref 70–99)
Potassium: 4.1 mmol/L (ref 3.5–5.2)
Sodium: 141 mmol/L (ref 134–144)
Total Protein: 6.2 g/dL (ref 6.0–8.5)
eGFR: 44 mL/min/{1.73_m2} — ABNORMAL LOW (ref >59)

## 2023-12-14 LAB — CBC WITH DIFFERENTIAL/PLATELET
Basophils Absolute: 0 10*3/uL (ref 0.0–0.2)
Basos: 1 %
EOS (ABSOLUTE): 0.1 10*3/uL (ref 0.0–0.4)
Eos: 2 %
Hematocrit: 44.5 % (ref 34.0–46.6)
Hemoglobin: 15 g/dL (ref 11.1–15.9)
Immature Grans (Abs): 0 10*3/uL (ref 0.0–0.1)
Immature Granulocytes: 0 %
Lymphocytes Absolute: 1.1 10*3/uL (ref 0.7–3.1)
Lymphs: 23 %
MCH: 31.5 pg (ref 26.6–33.0)
MCHC: 33.7 g/dL (ref 31.5–35.7)
MCV: 94 fL (ref 79–97)
Monocytes Absolute: 0.5 10*3/uL (ref 0.1–0.9)
Monocytes: 9 %
Neutrophils Absolute: 3.3 10*3/uL (ref 1.4–7.0)
Neutrophils: 65 %
Platelets: 170 10*3/uL (ref 150–450)
RBC: 4.76 x10E6/uL (ref 3.77–5.28)
RDW: 12.9 % (ref 11.7–15.4)
WBC: 5 10*3/uL (ref 3.4–10.8)

## 2023-12-14 LAB — LIPID PANEL
Chol/HDL Ratio: 2.5 ratio (ref 0.0–4.4)
Cholesterol, Total: 173 mg/dL (ref 100–199)
HDL: 70 mg/dL (ref >39)
LDL Chol Calc (NIH): 88 mg/dL (ref 0–99)
Triglycerides: 80 mg/dL (ref 0–149)
VLDL Cholesterol Cal: 15 mg/dL (ref 5–40)

## 2023-12-14 LAB — HEPATITIS B CORE ANTIBODY, TOTAL: Hep B Core Total Ab: NEGATIVE

## 2023-12-14 LAB — HEPATITIS B SURFACE ANTIGEN: Hepatitis B Surface Ag: NEGATIVE

## 2023-12-14 LAB — HEPATITIS B SURFACE ANTIBODY,QUALITATIVE: Hep B Surface Ab, Qual: NONREACTIVE

## 2023-12-14 LAB — HEPATITIS C ANTIBODY: Hep C Virus Ab: NONREACTIVE

## 2023-12-14 LAB — RHEUMATOID FACTOR: Rheumatoid fact SerPl-aCnc: 10.7 [IU]/mL (ref <14.0)

## 2023-12-14 LAB — HEMOGLOBIN A1C
Est. average glucose Bld gHb Est-mCnc: 117 mg/dL
Hgb A1c MFr Bld: 5.7 % — ABNORMAL HIGH (ref 4.8–5.6)

## 2023-12-14 LAB — C-REACTIVE PROTEIN: CRP: <1 mg/L (ref 0–10)

## 2023-12-15 ENCOUNTER — Encounter: Payer: Self-pay | Admitting: Physical Therapy

## 2023-12-15 ENCOUNTER — Ambulatory Visit: Attending: Family Medicine | Admitting: Physical Therapy

## 2023-12-15 DIAGNOSIS — R2681 Unsteadiness on feet: Secondary | ICD-10-CM | POA: Insufficient documentation

## 2023-12-15 DIAGNOSIS — R531 Weakness: Secondary | ICD-10-CM | POA: Insufficient documentation

## 2023-12-15 DIAGNOSIS — R269 Unspecified abnormalities of gait and mobility: Secondary | ICD-10-CM | POA: Diagnosis present

## 2023-12-15 NOTE — Therapy (Signed)
 OUTPATIENT PHYSICAL THERAPY NEURO EVALUATION   Patient Name: JENEA KOHORST MRN: [id number] DOB:1951/06/14, 73 y.o., female Today's Date: 12/15/2023   PCP: Mazie Speed, MD REFERRING PROVIDER: Mazie Speed, MD   END OF SESSION:   PT End of Session - 12/15/23 1405     Visit Number 1    Number of Visits 24    Date for PT Re-Evaluation 03/08/24    PT Start Time 1404    PT Stop Time 1451    PT Time Calculation (min) 47 min    Equipment Utilized During Treatment Gait belt    Activity Tolerance Patient tolerated treatment well    Behavior During Therapy WFL for tasks assessed/performed             Past Medical History:  Diagnosis Date   Allergy    Arthritis    Hypertension    Scoliosis of lumbosacral spine    Past Surgical History:  Procedure Laterality Date   CESAREAN SECTION     COLONOSCOPY WITH PROPOFOL  N/A 05/11/2017   Procedure: COLONOSCOPY WITH PROPOFOL ;  Surgeon: Marshall Skeeter, MD;  Location: [facility];  Service: Endoscopy;  Laterality: N/A;   COLONOSCOPY WITH PROPOFOL  N/A 12/16/2021   Procedure: COLONOSCOPY WITH PROPOFOL ;  Surgeon: Marshall Skeeter, MD;  Location: [facility];  Service: Endoscopy;  Laterality: N/A;   DILATATION & CURETTAGE/HYSTEROSCOPY WITH MYOSURE N/A 02/27/2016   Procedure: DILATATION & CURETTAGE/HYSTEROSCOPY WITH MYOSURE;  Surgeon: Carolynn Citrin, MD;  Location: [facility];  Service: Gynecology;  Laterality: N/A;   ESOPHAGOGASTRODUODENOSCOPY (EGD) WITH PROPOFOL  N/A 05/11/2017   Procedure: ESOPHAGOGASTRODUODENOSCOPY (EGD) WITH PROPOFOL ;  Surgeon: Marshall Skeeter, MD;  Location: [facility];  Service: Endoscopy;  Laterality: N/A;   sebaceous syst removed  08/24/2011   tonsillectomy     TONSILLECTOMY     Patient Active Problem List   Diagnosis Date Noted   Rheumatoid arthritis (HCC) 05/30/2023   Bilateral hand swelling 12/28/2022   Eczema 04/13/2018   Irritable bowel syndrome with diarrhea 04/13/2018    Prediabetes 04/13/2018   PMB (postmenopausal bleeding) 10/05/2017   Gastro-esophageal reflux disease with esophagitis 05/13/2017   Strain of muscle of right hip 05/06/2017   Bilateral hearing loss 04/12/2017   Scoliosis of lumbosacral spine 03/30/2017   Abnormality of gait and mobility 03/30/2017   Allergic rhinitis 09/08/2015   Hypertension 09/08/2015   Gonalgia 09/08/2015   RAD (reactive airway disease) 09/08/2015   Obesity 03/12/2015   Current tear of meniscus 01/02/2015   Tear of meniscus of knee 01/02/2015    ONSET DATE: Years ago  REFERRING DIAG: R26.9 (ICD-10-CM) - Abnormal gait   THERAPY DIAG:  Generalized weakness  Abnormality of gait  Unsteadiness on feet  Rationale for Evaluation and Treatment: Rehabilitation  SUBJECTIVE:  SUBJECTIVE STATEMENT:  Patient reports she had previously went to [facility] to see Ortho and was referred for physical therapy, but due to logistical errors she did not start PT at that time.  Pt states these gait deviations started years ago. Reports no injury at the time they started. Pt states she has always walked a lot for exercise, but it is hard for her to do now. Pt states she has noticed her gait deviations because she has started getting L thigh pain, but otherwise does not have pain. Pt states these deviations impact her balance.  States she has difficulty walking slow because her gait deviations are more pronounced.   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 mother, and immobile husband due to him needing a hip replacement.  Pt accompanied by: self  PERTINENT HISTORY: PMH: Rheumatoid arthritis, obesity, prediabetes, HTN  Per MD note on 12/13/2023: "Trendelenburg gait due to hip  weakness Trendelenburg gait due to hip weakness identified. Previous recommendation for physical therapy was not followed through due to logistical issues. - Refer to physical therapy for hip strengthening exercises"  PAIN:  Are you having pain? None related to hip weakness, but will have pain in hands when overusing them due to RA  PRECAUTIONS: None  RED FLAGS: None   WEIGHT BEARING RESTRICTIONS: No  FALLS: Has patient fallen in last 6 months? Yes. Number of falls 1x tripped backwards over her cat  LIVING ENVIRONMENT: Lives with: lives with their spouse and lives with their son (husband and special needs son) Lives in: House/apartment - 1 level Stairs: Yes: External: 1 steps; on left going up Has following equipment at home: shower chair and Grab bars  PLOF: Independent, Independent with household mobility without device, and Independent with homemaking with ambulation  PATIENT GOALS: be able to walk with a normal gait  OBJECTIVE:  Note: Objective measures were completed at Evaluation unless otherwise noted.  DIAGNOSTIC FINDINGS: N/A  COGNITION: Overall cognitive status: Within functional limits for tasks assessed   SENSATION: WFL  COORDINATION: Not formally assessed  EDEMA:  Not formally assessed, but none observed  MUSCLE TONE: appears WNL  MUSCLE LENGTH: Not formally assessed During supine testing, it is possible L LE is slightly longer than R LE, but didn't formally measure  DTRs:  Not formally assessed  POSTURE: rounded shoulders and forward head Need to assess spine curvature in more detail  LOWER EXTREMITY ROM:     Active   WFL/WNL throughout Right Eval Left Eval  Hip flexion    Hip extension    Hip abduction    Hip adduction    Hip internal rotation    Hip external rotation    Knee flexion    Knee extension    Ankle dorsiflexion    Ankle plantarflexion    Ankle inversion    Ankle eversion     (Blank rows = not tested)  LOWER  EXTREMITY MMT:    MMT Right Eval Left Eval  Hip flexion 4- 4+  Hip extension    Hip abduction 2+  Tested in sidelying 4- Tested in sidelying   Hip adduction    Hip internal rotation    Hip external rotation 3+ 4  Knee flexion 5 5  Knee extension 5 5  Ankle dorsiflexion 5 5  Ankle plantarflexion 5 5  Ankle inversion    Ankle eversion    (Blank rows = not tested)  Manual Muscle  Test Scale 0/5 = No muscle contraction can be seen or felt 1/5 = Contraction can be felt, but there is no motion 2-/5 = Part moves through incomplete ROM w/ gravity decreased 2/5 = Part moves through complete ROM w/ gravity decreased 2+/5 = Part moves through incomplete ROM (<50%) against gravity or through complete ROM w/ gravity 3-/5 = Part moves through incomplete ROM (>50%) against gravity 3/5 = Part moves through complete ROM against gravity 3+/5 = Part moves through complete ROM against gravity/slight resistance 4-/5= Holds test position against slight to moderate pressure 4/5 = Part moves through complete ROM against gravity/moderate resistance 4+/5= Holds test position against moderate to strong pressure 5/5 = Part moves through complete ROM against gravity/full resistance  BED MOBILITY:  Findings: Sit to supine Modified independence Supine to sit Modified independence  TRANSFERS: Sit to stand: Complete Independence  Assistive device utilized: None     Stand to sit: Complete Independence  Assistive device utilized: None     Chair to chair: Complete Independence  Assistive device utilized: None       RAMP:  Not tested  CURB:  Not tested  STAIRS: Findings: Level of Assistance: Modified independence, Stair Negotiation Technique: Alternating Pattern  Forwards with Single Rail on Right Single Rail on Left Bilateral Rails, Number of Stairs: 4, Height of Stairs: 6in   , and Comments: pt varying in railings used, but reciprocal pattern throughout GAIT: Findings: Gait Characteristics:  trendelenburg and lateral lean- Left, Distance walked: 132ft, Assistive device utilized:None, Level of assistance: Complete Independence, and Comments: L lateral trunk lean during every R stance phase due to R hip weakness and possible leg length discrepancy     FUNCTIONAL TESTS:  5 times sit to stand: 12.97 seconds 6 minute walk test: need to assess 10 meter walk test: 1.47m/s without AD Berg Balance Scale: need to assess Functional gait assessment: need to assess  PATIENT SURVEYS:  ABC scale 94.375% with pt reporting least confidence standing on chair to reach for something, stepping on/off escalator without holding on, and walking on ice - otherwise pt reporting 95-100% on other items                                                                                                                               TREATMENT DATE: 12/15/2023   10 Meter Walk Test: Patient instructed to walk 10 meters (32.8 ft) as quickly and as safely as possible at their normal speed Results: 1.65m/s m/s (9.55 seconds and 9.23 seconds)  Cut off scores:   Household Ambulator  < 0.4 m/s  Limited Community Ambulator  0.4 - 0.8 m/s  [employer]  > 0.8 m/s  Increased fall risk  < 1.19m/s  Crossing a Street  >1.63m/s  MCID 0.05 m/s (small), 0.13 m/s (moderate), 0.06 m/s (significant)  (ANPTA Core Set of Outcome Measures for Adults with Neurologic Conditions, 2018)     Five times Sit to Stand  Test (FTSS) "Stand up and sit down as quickly as possible 5 times, keeping your arms folded across your chest."    TIME: 12.97 seconds no UE support from green chair, slow to rise  Times > 13.6 seconds is associated with increased disability and morbidity (Guralnik, 2000) Times > 15 seconds is predictive of recurrent falls in healthy individuals aged 62 and older (Buatois, et al., 2008) Normal performance values in community dwelling individuals aged 16 and older (Bohannon, 2006): 60-69 years: 11.4  seconds 70-79 years: 12.6 seconds 80-89 years: 14.8 seconds  MCID: >= 2.3 seconds for Vestibular Disorders (Meretta, 2006)   Single Leg Stance:  L LE: 5 seconds R LE: 0 seconds   Therapist provided pt with the below HEP and led pt through 1 set of each of the exercises providing cuing for proper form/technique as well as educating pt on the purpose of the exercises to address her specific strength deficits.    PATIENT EDUCATION: Education details: Therapy POC, findings on assessment, HEP Person educated: Patient Education method: Explanation and Handouts Education comprehension: verbalized understanding and needs further education  HOME EXERCISE PROGRAM:  Access Code: [email or web address] URL: [email or web address] Date: 12/15/2023 Prepared by: Carlen Chasten  Exercises - Clamshell  - 1 x daily - 7 x weekly - 2 sets - 10 reps - Hooklying Clamshell with Resistance  - 1 x daily - 7 x weekly - 2 sets - 10 reps - Supine Bridge with Resistance Band  - 1 x daily - 7 x weekly - 2 sets - 10 reps   GOALS: Goals reviewed with patient? Yes  SHORT TERM GOALS: Target date: 01/26/2024   Patient will be independent in home exercise program to improve strength/mobility for better functional independence with ADLs.  Baseline: 12/15/2023 initial HEP provided Goal status: INITIAL   LONG TERM GOALS: Target date: 03/08/2024  Patient will increase Berg Balance score to > 51/56 to demonstrate improved balance and decreased fall risk during functional activities and ADLs.  Baseline: need to assess Goal status: INITIAL  2.  Patient will increase six minute walk test distance to >1043ft for progression to community ambulator and improve gait ability  Baseline: need to assess Goal status: INITIAL  3.  Patient will increase Functional Gait Assessment (FGA) score to >20/30 as to reduce fall risk and improve dynamic gait safety with community ambulation.  Baseline: need to assess Goal status:  INITIAL  4.  Patient will maintain 30 seconds of single leg stance on each LE without loss of balance indicating increased hip strength and improved ability to ambulate and navigate stairs without trendelenburg pattern. Baseline: L LE: 5 seconds, R LE: 0 seconds Goal status: INITIAL  5.  Patient will increase BLE gross strength to 4+/5 as to improve functional strength for independent gait, increased standing tolerance and increased ADL ability.  Baseline: see above Goal status: INITIAL   ASSESSMENT:  CLINICAL IMPRESSION: Patient is a 74 y.o. female who was seen today for physical therapy evaluation and treatment for hip weakness resulting in trendelenburg gait pattern and impaired balance. Patient reports progressive decline in her gait mechanics over the past several years with no known injury preceding it. Pt demonstrates significant trendelenburg gait pattern with significant L lateral trunk lean during R stance phase of gait. Pt has potential leg length discrepancy with L LE being longer during supine test, but formal measurement not taken at this time. Pt demonstrates significant hip weakness as noted above with inability to perform single leg stance on  R LE and very limited on L LE. Initiated HEP today focusing on hip strengthening to improve pt's gait mechanics. Ms. Langton will benefit from further skilled PT to improve these deficits in order to increase QOL, improve gait mechanics, decrease fall risk, and ease/safety with ADLs.   OBJECTIVE IMPAIRMENTS: Abnormal gait, decreased activity tolerance, decreased balance, decreased endurance, decreased knowledge of condition, decreased mobility, difficulty walking, decreased strength, improper body mechanics, and pain.   ACTIVITY LIMITATIONS: carrying, lifting, bending, standing, squatting, stairs, transfers, dressing, locomotion level, and caring for others  PARTICIPATION LIMITATIONS: cleaning, laundry, shopping, and community  activity  PERSONAL FACTORS: Age, Sex, Time since onset of injury/illness/exacerbation, and 3+ comorbidities: Rheumatoid arthritis, obesity, prediabetes, HTN  are also affecting patient's functional outcome.   REHAB POTENTIAL: Good  CLINICAL DECISION MAKING: Stable/uncomplicated  EVALUATION COMPLEXITY: Low  PLAN:  PT FREQUENCY: 1-2x/week  PT DURATION: 12 weeks  PLANNED INTERVENTIONS: [id number]- PT Re-evaluation, 97750- Physical Performance Testing, 97110-Therapeutic exercises, 97530- Therapeutic activity, [id number]- Neuromuscular re-education, 97535- Self Care, [id number]- Manual therapy, [id number]- Gait training, [phone]- Orthotic Initial, [phone]- Orthotic/Prosthetic subsequent, [phone]- Canalith repositioning, [phone]- Electrical stimulation (manual), Patient/Family education, Balance training, Stair training, Taping, Dry Needling, Joint mobilization, Joint manipulation, Spinal mobilization, Vestibular training, DME instructions, Cryotherapy, Moist heat, and Biofeedback  PLAN FOR NEXT SESSION:  - Need to assess spine curvature in more detail - specifically measure leg length - discuss possible orthotic if needed  - Berg Balance Test - FGA - 6 min walk test - focus on hip strengthening, specifically R hip abductors to decrease trendelenburg during gait   Maylynn Orzechowski, PT, DPT, NCS, CSRS Physical Therapist - [place]  [facility]  2:53 PM 12/15/23

## 2023-12-19 ENCOUNTER — Ambulatory Visit: Admitting: Physical Therapy

## 2023-12-19 DIAGNOSIS — R531 Weakness: Secondary | ICD-10-CM

## 2023-12-19 DIAGNOSIS — R2681 Unsteadiness on feet: Secondary | ICD-10-CM

## 2023-12-19 DIAGNOSIS — R269 Unspecified abnormalities of gait and mobility: Secondary | ICD-10-CM

## 2023-12-19 NOTE — Therapy (Signed)
 OUTPATIENT PHYSICAL THERAPY NEURO TREATMENT   Patient Name: Rita Bailey MRN: [id number] DOB:[birth year], 73 y.o., female Today's Date: 12/19/2023   PCP: Mazie Speed, MD REFERRING PROVIDER: Mazie Speed, MD   END OF SESSION:     Past Medical History:  Diagnosis Date   Allergy    Arthritis    Hypertension    Scoliosis of lumbosacral spine    Past Surgical History:  Procedure Laterality Date   CESAREAN SECTION     COLONOSCOPY WITH PROPOFOL  N/A 05/11/2017   Procedure: COLONOSCOPY WITH PROPOFOL ;  Surgeon: Marshall Skeeter, MD;  Location: [facility];  Service: Endoscopy;  Laterality: N/A;   COLONOSCOPY WITH PROPOFOL  N/A 12/16/2021   Procedure: COLONOSCOPY WITH PROPOFOL ;  Surgeon: Marshall Skeeter, MD;  Location: [facility];  Service: Endoscopy;  Laterality: N/A;   DILATATION & CURETTAGE/HYSTEROSCOPY WITH MYOSURE N/A 02/27/2016   Procedure: DILATATION & CURETTAGE/HYSTEROSCOPY WITH MYOSURE;  Surgeon: Carolynn Citrin, MD;  Location: [facility];  Service: Gynecology;  Laterality: N/A;   ESOPHAGOGASTRODUODENOSCOPY (EGD) WITH PROPOFOL  N/A 05/11/2017   Procedure: ESOPHAGOGASTRODUODENOSCOPY (EGD) WITH PROPOFOL ;  Surgeon: Marshall Skeeter, MD;  Location: [facility];  Service: Endoscopy;  Laterality: N/A;   sebaceous syst removed  08/24/2011   tonsillectomy     TONSILLECTOMY     Patient Active Problem List   Diagnosis Date Noted   Rheumatoid arthritis (HCC) 05/30/2023   Bilateral hand swelling 12/28/2022   Eczema 04/13/2018   Irritable bowel syndrome with diarrhea 04/13/2018   Prediabetes 04/13/2018   PMB (postmenopausal bleeding) 10/05/2017   Gastro-esophageal reflux disease with esophagitis 05/13/2017   Strain of muscle of right hip 05/06/2017   Bilateral hearing loss 04/12/2017   Scoliosis of lumbosacral spine 03/30/2017   Abnormality of gait and mobility 03/30/2017   Allergic rhinitis 09/08/2015   Hypertension 09/08/2015   Gonalgia  09/08/2015   RAD (reactive airway disease) 09/08/2015   Obesity 03/12/2015   Current tear of meniscus 01/02/2015   Tear of meniscus of knee 01/02/2015    ONSET DATE: Years ago  REFERRING DIAG: R26.9 (ICD-10-CM) - Abnormal gait   THERAPY DIAG:  No diagnosis found.  Rationale for Evaluation and Treatment: Rehabilitation  SUBJECTIVE:                                                                                                                                                                                             SUBJECTIVE STATEMENT:  Pt reports a busy weekend and doing her HEP.   From eval:  Patient reports she had previously went to [facility] to see Ortho and was referred for physical therapy,  but due to logistical errors she did not start PT at that time.  Pt states these gait deviations started years ago. Reports no injury at the time they started. Pt states she has always walked a lot for exercise, but it is hard for her to do now. Pt states she has noticed her gait deviations because she has started getting L thigh pain, but otherwise does not have pain. Pt states these deviations impact her balance.  States she has difficulty walking slow because her gait deviations are more pronounced.   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 mother, and immobile husband due to him needing a hip replacement.  Pt accompanied by: self  PERTINENT HISTORY: PMH: Rheumatoid arthritis, obesity, prediabetes, HTN  Per MD note on 12/13/2023: "Trendelenburg gait due to hip weakness Trendelenburg gait due to hip weakness identified. Previous recommendation for physical therapy was not followed through due to logistical issues. - Refer to physical therapy for hip strengthening exercises"  PAIN:  Are you having pain? None related to hip weakness, but will have pain in hands when overusing them due to RA  PRECAUTIONS: None  RED  FLAGS: None   WEIGHT BEARING RESTRICTIONS: No  FALLS: Has patient fallen in last 6 months? Yes. Number of falls 1x tripped backwards over her cat  LIVING ENVIRONMENT: Lives with: lives with their spouse and lives with their son (husband and special needs son) Lives in: House/apartment - 1 level Stairs: Yes: External: 1 steps; on left going up Has following equipment at home: shower chair and Grab bars  PLOF: Independent, Independent with household mobility without device, and Independent with homemaking with ambulation  PATIENT GOALS: be able to walk with a normal gait  OBJECTIVE:  Note: Objective measures were completed at Evaluation unless otherwise noted.  DIAGNOSTIC FINDINGS: N/A  COGNITION: Overall cognitive status: Within functional limits for tasks assessed   SENSATION: WFL  COORDINATION: Not formally assessed  EDEMA:  Not formally assessed, but none observed  MUSCLE TONE: appears WNL  MUSCLE LENGTH: Not formally assessed During supine testing, it is possible L LE is slightly longer than R LE, but didn't formally measure  DTRs:  Not formally assessed  POSTURE: rounded shoulders and forward head Need to assess spine curvature in more detail  LOWER EXTREMITY ROM:     Active   WFL/WNL throughout Right Eval Left Eval  Hip flexion    Hip extension    Hip abduction    Hip adduction    Hip internal rotation    Hip external rotation    Knee flexion    Knee extension    Ankle dorsiflexion    Ankle plantarflexion    Ankle inversion    Ankle eversion     (Blank rows = not tested)  LOWER EXTREMITY MMT:    MMT Right Eval Left Eval  Hip flexion 4- 4+  Hip extension    Hip abduction 2+  Tested in sidelying 4- Tested in sidelying   Hip adduction    Hip internal rotation    Hip external rotation 3+ 4  Knee flexion 5 5  Knee extension 5 5  Ankle dorsiflexion 5 5  Ankle plantarflexion 5 5  Ankle inversion    Ankle eversion    (Blank rows =  not tested)  Manual Muscle Test Scale 0/5 = No muscle contraction can be seen or felt 1/5 = Contraction can be felt, but there is  no motion 2-/5 = Part moves through incomplete ROM w/ gravity decreased 2/5 = Part moves through complete ROM w/ gravity decreased 2+/5 = Part moves through incomplete ROM (<50%) against gravity or through complete ROM w/ gravity 3-/5 = Part moves through incomplete ROM (>50%) against gravity 3/5 = Part moves through complete ROM against gravity 3+/5 = Part moves through complete ROM against gravity/slight resistance 4-/5= Holds test position against slight to moderate pressure 4/5 = Part moves through complete ROM against gravity/moderate resistance 4+/5= Holds test position against moderate to strong pressure 5/5 = Part moves through complete ROM against gravity/full resistance  BED MOBILITY:  Findings: Sit to supine Modified independence Supine to sit Modified independence  TRANSFERS: Sit to stand: Complete Independence  Assistive device utilized: None     Stand to sit: Complete Independence  Assistive device utilized: None     Chair to chair: Complete Independence  Assistive device utilized: None       RAMP:  Not tested  CURB:  Not tested  STAIRS: Findings: Level of Assistance: Modified independence, Stair Negotiation Technique: Alternating Pattern  Forwards with Single Rail on Right Single Rail on Left Bilateral Rails, Number of Stairs: 4, Height of Stairs: 6in   , and Comments: pt varying in railings used, but reciprocal pattern throughout GAIT: Findings: Gait Characteristics: trendelenburg and lateral lean- Left, Distance walked: 11ft, Assistive device utilized:None, Level of assistance: Complete Independence, and Comments: L lateral trunk lean during every R stance phase due to R hip weakness and possible leg length discrepancy     FUNCTIONAL TESTS:  5 times sit to stand: 12.97 seconds 6 minute walk test: need to assess 10 meter walk  test: 1.74m/s without AD Berg Balance Scale: need to assess Functional gait assessment: need to assess  PATIENT SURVEYS:  ABC scale 94.375% with pt reporting least confidence standing on chair to reach for something, stepping on/off escalator without holding on, and walking on ice - otherwise pt reporting 95-100% on other items                                                                                                                               TREATMENT DATE: 12/19/2023  PHYSICAL PERFORMANCE Patient demonstrates increased fall risk as noted by score of  55 /56 on Berg Balance Scale.  (<36= high risk for falls, close to 100%; 37-45 significant >80%; 46-51 moderate >50%; 52-55 lower >25%)  [facility] PT Assessment - 12/19/23 0001       Standardized Balance Assessment   Standardized Balance Assessment Berg Balance Test      Berg Balance Test   Sit to Stand Able to stand without using hands and stabilize independently    Standing Unsupported Able to stand safely 2 minutes    Sitting with Back Unsupported but Feet Supported on Floor or Stool Able to sit safely and securely 2 minutes    Stand to Sit Sits safely with minimal use of  hands    Transfers Able to transfer safely, minor use of hands    Standing Unsupported with Eyes Closed Able to stand 10 seconds safely    Standing Unsupported with Feet Together Able to place feet together independently and stand 1 minute safely    From Standing, Reach Forward with Outstretched Arm Can reach confidently >25 cm (10")    From Standing Position, Pick up Object from Floor Able to pick up shoe safely and easily    From Standing Position, Turn to Look Behind Over each Shoulder Looks behind one side only/other side shows less weight shift    Turn 360 Degrees Able to turn 360 degrees safely in 4 seconds or less    Standing Unsupported, Alternately Place Feet on Step/Stool Able to stand independently and safely and complete 8 steps in 20 seconds     Standing Unsupported, One Foot in Front Able to place foot tandem independently and hold 30 seconds    Standing on One Leg Able to lift leg independently and hold > 10 seconds   completes on L for 10 sec, can't so on R   Total Score 55             6 Min Walk Test:  Instructed patient to ambulate as quickly and as safely as possible for 6 minutes using LRAD. Patient was allowed to take standing rest breaks without stopping the test, but if the patient required a sitting rest break the clock would be stopped and the test would be over.  Results: 1080 feet. Results indicate that the patient has reduced endurance with ambulation compared to age matched norms.  Age Matched Norms (in meters): 75-69 yo M: 57 F: 77, 28-79 yo M: 69 F: 471, 26-89 yo M: 417 F: 392 MDC: 58.21 meters (190.98 feet) or 50 meters (ANPTA Core Set of Outcome Measures for Adults with Neurologic Conditions, 2018)  TE Supine bridge with GTB around knees for increased gluteal recruitment   3 x 10   Adductor squeeze 2 x 10 x 5 sec   Standing side plank on wall on R side 3 x 20 sec   PATIENT EDUCATION: Education details: Therapy POC, findings on assessment, HEP Person educated: Patient Education method: Explanation and Handouts Education comprehension: verbalized understanding and needs further education  HOME EXERCISE PROGRAM:  Access Code: [email or web address] URL: [email or web address] Date: 12/15/2023 Prepared by: Carlen Chasten  Exercises - Clamshell  - 1 x daily - 7 x weekly - 2 sets - 10 reps - Hooklying Clamshell with Resistance  - 1 x daily - 7 x weekly - 2 sets - 10 reps - Supine Bridge with Resistance Band  - 1 x daily - 7 x weekly - 2 sets - 10 reps   GOALS: Goals reviewed with patient? Yes  SHORT TERM GOALS: Target date: 01/26/2024   Patient will be independent in home exercise program to improve strength/mobility for better functional independence with ADLs.  Baseline: 12/15/2023 initial HEP  provided Goal status: INITIAL   LONG TERM GOALS: Target date: 03/08/2024  Patient will increase Berg Balance score to > 51/56 to demonstrate improved balance and decreased fall risk during functional activities and ADLs.  Baseline: need to assess Goal status: INITIAL  2.  Patient will increase six minute walk test distance to >103ft for progression to community ambulator and improve gait ability  Baseline: 1080 ft  Goal status: INITIAL  3.  Patient will increase Functional Gait Assessment (FGA) score to >20/30 as to reduce  fall risk and improve dynamic gait safety with community ambulation.  Baseline: need to assess Goal status: INITIAL  4.  Patient will maintain 30 seconds of single leg stance on each LE without loss of balance indicating increased hip strength and improved ability to ambulate and navigate stairs without trendelenburg pattern. Baseline: L LE: 5 seconds, R LE: 0 seconds Goal status: INITIAL  5.  Patient will increase BLE gross strength to 4+/5 as to improve functional strength for independent gait, increased standing tolerance and increased ADL ability.  Baseline: see above Goal status: INITIAL   ASSESSMENT:  CLINICAL IMPRESSION:  Patient arrived with good motivation for completion of pt activities. Pt been working diligently on HEP. Pt provided with green TB for increased resistance with HEP. Pt progressed with hip strengthening. Pt balance and WNL for community mobility but distance could improve for improving to age matched norms. Still need to complete FGA which will likely capture greater balance deficits compared to BERG. Pt will continue to benefit from skilled physical therapy intervention to address impairments, improve QOL, and attain therapy goals.    OBJECTIVE IMPAIRMENTS: Abnormal gait, decreased activity tolerance, decreased balance, decreased endurance, decreased knowledge of condition, decreased mobility, difficulty walking, decreased  strength, improper body mechanics, and pain.   ACTIVITY LIMITATIONS: carrying, lifting, bending, standing, squatting, stairs, transfers, dressing, locomotion level, and caring for others  PARTICIPATION LIMITATIONS: cleaning, laundry, shopping, and community activity  PERSONAL FACTORS: Age, Sex, Time since onset of injury/illness/exacerbation, and 3+ comorbidities: Rheumatoid arthritis, obesity, prediabetes, HTN  are also affecting patient's functional outcome.   REHAB POTENTIAL: Good  CLINICAL DECISION MAKING: Stable/uncomplicated  EVALUATION COMPLEXITY: Low  PLAN:  PT FREQUENCY: 1-2x/week  PT DURATION: 12 weeks  PLANNED INTERVENTIONS: [id number]- PT Re-evaluation, 97750- Physical Performance Testing, 97110-Therapeutic exercises, 97530- Therapeutic activity, [id number]- Neuromuscular re-education, 97535- Self Care, [id number]- Manual therapy, [id number]- Gait training, [phone]- Orthotic Initial, [phone]- Orthotic/Prosthetic subsequent, [phone]- Canalith repositioning, [phone]- Electrical stimulation (manual), Patient/Family education, Balance training, Stair training, Taping, Dry Needling, Joint mobilization, Joint manipulation, Spinal mobilization, Vestibular training, DME instructions, Cryotherapy, Moist heat, and Biofeedback  PLAN FOR NEXT SESSION:  - Need to assess spine curvature in more detail - specifically measure leg length - discuss possible orthotic if needed  - FGA - focus on hip strengthening, specifically R hip abductors to decrease trendelenburg during gait   Edwina Gram PT ,DPT Physical Therapist- [facility]  [facility]   12:53 PM 12/19/23

## 2023-12-21 ENCOUNTER — Ambulatory Visit: Admitting: Physical Therapy

## 2023-12-21 DIAGNOSIS — R2681 Unsteadiness on feet: Secondary | ICD-10-CM

## 2023-12-21 DIAGNOSIS — R531 Weakness: Secondary | ICD-10-CM | POA: Diagnosis not present

## 2023-12-21 DIAGNOSIS — R269 Unspecified abnormalities of gait and mobility: Secondary | ICD-10-CM

## 2023-12-21 NOTE — Therapy (Signed)
 OUTPATIENT PHYSICAL THERAPY NEURO TREATMENT   Patient Name: Rita Bailey MRN: [id number] DOB:05-Sep-1950, 73 y.o., female Today's Date: 12/21/2023   PCP: Mazie Speed, MD REFERRING PROVIDER: Mazie Speed, MD   END OF SESSION:   PT End of Session - 12/21/23 1623     Visit Number 3    Number of Visits 24    Date for PT Re-Evaluation 03/08/24    Progress Note Due on Visit 10    PT Start Time 1317    PT Stop Time 1357    PT Time Calculation (min) 40 min    Equipment Utilized During Treatment Gait belt    Activity Tolerance Patient tolerated treatment well    Behavior During Therapy [facility] for tasks assessed/performed              Past Medical History:  Diagnosis Date   Allergy    Arthritis    Hypertension    Scoliosis of lumbosacral spine    Past Surgical History:  Procedure Laterality Date   CESAREAN SECTION     COLONOSCOPY WITH PROPOFOL  N/A 05/11/2017   Procedure: COLONOSCOPY WITH PROPOFOL ;  Surgeon: Marshall Skeeter, MD;  Location: [facility] END[facility];  Service: Endoscopy;  Laterality: N/A;   COLONOSCOPY WITH PROPOFOL  N/A 12/16/2021   Procedure: COLONOSCOPY WITH PROPOFOL ;  Surgeon: Marshall Skeeter, MD;  Location: [facility] ENDOSCOPY;  Service: Endoscopy;  Laterality: N/A;   DILATATION & CURETTAGE/HYSTEROSCOPY WITH MYOSURE N/A 02/27/2016   Procedure: DILATATION & CURETTAGE/HYSTEROSCOPY WITH MYOSURE;  Surgeon: Carolynn Citrin, MD;  Location: [facility] ORS;  Service: Gynecology;  Laterality: N/A;   ESOPHAGOGASTRODUODENOSCOPY (EGD) WITH PROPOFOL  N/A 05/11/2017   Procedure: ESOPHAGOGASTRODUODENOSCOPY (EGD) WITH PROPOFOL ;  Surgeon: Marshall Skeeter, MD;  Location: [facility] ENDOSCOPY;  Service: Endoscopy;  Laterality: N/A;   sebaceous syst removed  08/24/2011   tonsillectomy     TONSILLECTOMY     Patient Active Problem List   Diagnosis Date Noted   Rheumatoid arthritis (HCC) 05/30/2023   Bilateral hand swelling 12/28/2022   Eczema 04/13/2018   Irritable bowel  syndrome with diarrhea 04/13/2018   Prediabetes 04/13/2018   PMB (postmenopausal bleeding) 10/05/2017   Gastro-esophageal reflux disease with esophagitis 05/13/2017   Strain of muscle of right hip 05/06/2017   Bilateral hearing loss 04/12/2017   Scoliosis of lumbosacral spine 03/30/2017   Abnormality of gait and mobility 03/30/2017   Allergic rhinitis 09/08/2015   Hypertension 09/08/2015   Gonalgia 09/08/2015   RAD (reactive airway disease) 09/08/2015   Obesity 03/12/2015   Current tear of meniscus 01/02/2015   Tear of meniscus of knee 01/02/2015    ONSET DATE: Years ago  REFERRING DIAG: R26.9 (ICD-10-CM) - Abnormal gait   THERAPY DIAG:  No diagnosis found.  Rationale for Evaluation and Treatment: Rehabilitation  SUBJECTIVE:  SUBJECTIVE STATEMENT:  Pt reports a busy weekend and doing her HEP. No updates since last session.   From eval:  Patient reports she had previously went to [facility] to see Ortho and was referred for physical therapy, but due to logistical errors she did not start PT at that time.  Pt states these gait deviations started years ago. Reports no injury at the time they started. Pt states she has always walked a lot for exercise, but it is hard for her to do now. Pt states she has noticed her gait deviations because she has started getting L thigh pain, but otherwise does not have pain. Pt states these deviations impact her balance.  States she has difficulty walking slow because her gait deviations are more pronounced.   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 mother, and immobile husband due to him needing a hip replacement.  Pt accompanied by: self  PERTINENT HISTORY: PMH: Rheumatoid arthritis, obesity,  prediabetes, HTN  Per MD note on 12/13/2023: "Trendelenburg gait due to hip weakness Trendelenburg gait due to hip weakness identified. Previous recommendation for physical therapy was not followed through due to logistical issues. - Refer to physical therapy for hip strengthening exercises"  PAIN:  Are you having pain? None related to hip weakness, but will have pain in hands when overusing them due to RA  PRECAUTIONS: None  RED FLAGS: None   WEIGHT BEARING RESTRICTIONS: No  FALLS: Has patient fallen in last 6 months? Yes. Number of falls 1x tripped backwards over her cat  LIVING ENVIRONMENT: Lives with: lives with their spouse and lives with their son (husband and special needs son) Lives in: House/apartment - 1 level Stairs: Yes: External: 1 steps; on left going up Has following equipment at home: shower chair and Grab bars  PLOF: Independent, Independent with household mobility without device, and Independent with homemaking with ambulation  PATIENT GOALS: be able to walk with a normal gait  OBJECTIVE:  Note: Objective measures were completed at Evaluation unless otherwise noted.  DIAGNOSTIC FINDINGS: N/A  COGNITION: Overall cognitive status: Within functional limits for tasks assessed   SENSATION: WFL  COORDINATION: Not formally assessed  EDEMA:  Not formally assessed, but none observed  MUSCLE TONE: appears WNL  MUSCLE LENGTH: Not formally assessed During supine testing, it is possible L LE is slightly longer than R LE, but didn't formally measure  DTRs:  Not formally assessed  POSTURE: rounded shoulders and forward head Need to assess spine curvature in more detail  LOWER EXTREMITY ROM:     Active   WFL/WNL throughout Right Eval Left Eval  Hip flexion    Hip extension    Hip abduction    Hip adduction    Hip internal rotation    Hip external rotation    Knee flexion    Knee extension    Ankle dorsiflexion    Ankle plantarflexion    Ankle  inversion    Ankle eversion     (Blank rows = not tested)  LOWER EXTREMITY MMT:    MMT Right Eval Left Eval  Hip flexion 4- 4+  Hip extension    Hip abduction 2+  Tested in sidelying 4- Tested in sidelying   Hip adduction    Hip internal rotation    Hip external rotation 3+ 4  Knee flexion 5 5  Knee extension 5 5  Ankle dorsiflexion 5 5  Ankle plantarflexion 5 5  Ankle inversion    Ankle eversion    (Blank rows = not tested)  Manual Muscle Test Scale 0/5 = No muscle contraction can be seen or felt 1/5 = Contraction can be felt, but there is no motion 2-/5 = Part moves through incomplete ROM w/ gravity decreased 2/5 = Part moves through complete ROM w/ gravity decreased 2+/5 = Part moves through incomplete ROM (<50%) against gravity or through complete ROM w/ gravity 3-/5 = Part moves through incomplete ROM (>50%) against gravity 3/5 = Part moves through complete ROM against gravity 3+/5 = Part moves through complete ROM against gravity/slight resistance 4-/5= Holds test position against slight to moderate pressure 4/5 = Part moves through complete ROM against gravity/moderate resistance 4+/5= Holds test position against moderate to strong pressure 5/5 = Part moves through complete ROM against gravity/full resistance  BED MOBILITY:  Findings: Sit to supine Modified independence Supine to sit Modified independence  TRANSFERS: Sit to stand: Complete Independence  Assistive device utilized: None     Stand to sit: Complete Independence  Assistive device utilized: None     Chair to chair: Complete Independence  Assistive device utilized: None       RAMP:  Not tested  CURB:  Not tested  STAIRS: Findings: Level of Assistance: Modified independence, Stair Negotiation Technique: Alternating Pattern  Forwards with Single Rail on Right Single Rail on Left Bilateral Rails, Number of Stairs: 4, Height of Stairs: 6in   , and Comments: pt varying in railings used, but  reciprocal pattern throughout GAIT: Findings: Gait Characteristics: trendelenburg and lateral lean- Left, Distance walked: 139ft, Assistive device utilized:None, Level of assistance: Complete Independence, and Comments: L lateral trunk lean during every R stance phase due to R hip weakness and possible leg length discrepancy     FUNCTIONAL TESTS:  5 times sit to stand: 12.97 seconds 6 minute walk test: need to assess 10 meter walk test: 1.24m/s without AD Berg Balance Scale: need to assess Functional gait assessment: need to assess  PATIENT SURVEYS:  ABC scale 94.375% with pt reporting least confidence standing on chair to reach for something, stepping on/off escalator without holding on, and walking on ice - otherwise pt reporting 95-100% on other items                                                                                                                               TREATMENT DATE: 12/21/2023  PHYSICAL PERFORMANCE  [facility] PT Assessment - 12/21/23 0001       Functional Gait  Assessment   Gait assessed  Yes    Gait Level Surface Walks 20 ft in less than 7 sec but greater than 5.5 sec, uses assistive device, slower speed, mild gait deviations, or deviates 6-10 in outside of the 12 in walkway width.    Change in Gait Speed Able to change speed, demonstrates mild gait deviations, deviates 6-10 in outside of the 12 in walkway width, or  no gait deviations, unable to achieve a major change in velocity, or uses a change in velocity, or uses an assistive device.    Gait with Horizontal Head Turns Performs head turns smoothly with no change in gait. Deviates no more than 6 in outside 12 in walkway width    Gait with Vertical Head Turns Performs head turns with no change in gait. Deviates no more than 6 in outside 12 in walkway width.    Gait and Pivot Turn Pivot turns safely within 3 sec and stops quickly with no loss of balance.    Step Over Obstacle Is able to step over one shoe box (4.5  in total height) without changing gait speed. No evidence of imbalance.    Gait with Narrow Base of Support Is able to ambulate for 10 steps heel to toe with no staggering.    Gait with Eyes Closed Walks 20 ft, slow speed, abnormal gait pattern, evidence for imbalance, deviates 10-15 in outside 12 in walkway width. Requires more than 9 sec to ambulate 20 ft.    Ambulating Backwards Walks 20 ft, uses assistive device, slower speed, mild gait deviations, deviates 6-10 in outside 12 in walkway width.    Steps Alternating feet, must use rail.    Total Score 23            Leg length measurement: R 90 cm, L 94 cm, attempted heel lift in R shoe but did not correct or help with gait pattern, pt reported " didn't feel good"   TE Supine bridge with GTB around knees for increased gluteal recruitment   3 x 10   Adductor squeeze 2 x 10 x 5 sec   SL Hip ABD 2 x 10 on ea side, assistance for proper form    NMR  Bird dog on wall x 10 ea, difficulty with RLE on floor/ stabilizing,   Standing side plank on wall on R side 3 x 20, 1 x 30 sec ea  sec   Standing side step up with ad lib UE support for frontal plane challenge x 20 with 5 sec hold ( uses heavy L UE support to offload R glute med) good form which was goal or reeducation to proper hip positioning.   PATIENT EDUCATION: Education details: Therapy POC, findings on assessment, HEP Person educated: Patient Education method: Explanation and Handouts Education comprehension: verbalized understanding and needs further education  HOME EXERCISE PROGRAM:  Access Code: [email or web address] URL: [email or web address] Date: 12/15/2023 Prepared by: Carlen Chasten  Exercises - Clamshell  - 1 x daily - 7 x weekly - 2 sets - 10 reps - Hooklying Clamshell with Resistance  - 1 x daily - 7 x weekly - 2 sets - 10 reps - Supine Bridge with Resistance Band  - 1 x daily - 7 x weekly - 2 sets - 10 reps   GOALS: Goals reviewed with patient?  Yes  SHORT TERM GOALS: Target date: 01/26/2024   Patient will be independent in home exercise program to improve strength/mobility for better functional independence with ADLs.  Baseline: 12/15/2023 initial HEP provided Goal status: INITIAL   LONG TERM GOALS: Target date: 03/08/2024  Patient will increase Berg Balance score to > 51/56 to demonstrate improved balance and decreased fall risk during functional activities and ADLs.  Baseline: need to assess Goal status: INITIAL  2.  Patient will increase six minute walk test distance to >1034ft for progression to community ambulator and improve gait ability  Baseline: 1080 ft  Goal status: INITIAL  3.  Patient will increase Functional Gait Assessment (FGA) score to >20/30 as to reduce fall risk and improve dynamic gait safety with community ambulation.  Baseline: 23 Goal status: MET  4.  Patient will maintain 30 seconds of single leg stance on each LE without loss of balance indicating increased hip strength and improved ability to ambulate and navigate stairs without trendelenburg pattern. Baseline: L LE: 5 seconds, R LE: 0 seconds Goal status: INITIAL  5.  Patient will increase BLE gross strength to 4+/5 as to improve functional strength for independent gait, increased standing tolerance and increased ADL ability.  Baseline: see above Goal status: INITIAL   ASSESSMENT:  CLINICAL IMPRESSION:  Patient arrived with good motivation for completion of pt activities. Pt been working diligently on HEP. Pt continues with core and gluteal strengthening progression. Worked on some reintegration of post   OBJECTIVE IMPAIRMENTS: Abnormal gait, decreased activity tolerance, decreased balance, decreased endurance, decreased knowledge of condition, decreased mobility, difficulty walking, decreased strength, improper body mechanics, and pain.   ACTIVITY LIMITATIONS: carrying, lifting, bending, standing, squatting, stairs, transfers, dressing,  locomotion level, and caring for others  PARTICIPATION LIMITATIONS: cleaning, laundry, shopping, and community activity  PERSONAL FACTORS: Age, Sex, Time since onset of injury/illness/exacerbation, and 3+ comorbidities: Rheumatoid arthritis, obesity, prediabetes, HTN  are also affecting patient's functional outcome.   REHAB POTENTIAL: Good  CLINICAL DECISION MAKING: Stable/uncomplicated  EVALUATION COMPLEXITY: Low  PLAN:  PT FREQUENCY: 1-2x/week  PT DURATION: 12 weeks  PLANNED INTERVENTIONS: 97164- PT Re-evaluation, 97750- Physical Performance Testing, 97110-Therapeutic exercises, 97530- Therapeutic activity, [id number]- Neuromuscular re-education, 97535- Self Care, [id number]- Manual therapy, [id number]- Gait training, [phone]- Orthotic Initial, [phone]- Orthotic/Prosthetic subsequent, [phone]- Canalith repositioning, [phone]- Electrical stimulation (manual), Patient/Family education, Balance training, Stair training, Taping, Dry Needling, Joint mobilization, Joint manipulation, Spinal mobilization, Vestibular training, DME instructions, Cryotherapy, Moist heat, and Biofeedback  PLAN FOR NEXT SESSION:  - Need to assess spine curvature in more detail - specifically measure leg length - discuss possible orthotic if needed  - FGA - focus on hip strengthening, specifically R hip abductors to decrease trendelenburg during gait   Edwina Gram PT ,DPT Physical Therapist- [facility]  [facility]   4:25 PM 12/21/23

## 2023-12-26 ENCOUNTER — Ambulatory Visit: Payer: PRIVATE HEALTH INSURANCE | Admitting: Physical Therapy

## 2023-12-26 DIAGNOSIS — R531 Weakness: Secondary | ICD-10-CM

## 2023-12-26 DIAGNOSIS — R269 Unspecified abnormalities of gait and mobility: Secondary | ICD-10-CM

## 2023-12-26 DIAGNOSIS — R2681 Unsteadiness on feet: Secondary | ICD-10-CM

## 2023-12-26 NOTE — Therapy (Signed)
 OUTPATIENT PHYSICAL THERAPY NEURO TREATMENT   Patient Name: Rita Bailey MRN: [id number] DOB:1951/03/24, 73 y.o., female Today's Date: 12/26/2023   PCP: Mazie Speed, MD REFERRING PROVIDER: Mazie Speed, MD   END OF SESSION:   PT End of Session - 12/26/23 1058     Visit Number 4    Number of Visits 24    Date for PT Re-Evaluation 03/08/24    Progress Note Due on Visit 10    PT Start Time 1100    PT Stop Time 1141    PT Time Calculation (min) 41 min    Equipment Utilized During Treatment Gait belt    Activity Tolerance Patient tolerated treatment well    Behavior During Therapy WFL for tasks assessed/performed              Past Medical History:  Diagnosis Date   Allergy    Arthritis    Hypertension    Scoliosis of lumbosacral spine    Past Surgical History:  Procedure Laterality Date   CESAREAN SECTION     COLONOSCOPY WITH PROPOFOL  N/A 05/11/2017   Procedure: COLONOSCOPY WITH PROPOFOL ;  Surgeon: Marshall Skeeter, MD;  Location: [facility];  Service: Endoscopy;  Laterality: N/A;   COLONOSCOPY WITH PROPOFOL  N/A 12/16/2021   Procedure: COLONOSCOPY WITH PROPOFOL ;  Surgeon: Marshall Skeeter, MD;  Location: [facility];  Service: Endoscopy;  Laterality: N/A;   DILATATION & CURETTAGE/HYSTEROSCOPY WITH MYOSURE N/A 02/27/2016   Procedure: DILATATION & CURETTAGE/HYSTEROSCOPY WITH MYOSURE;  Surgeon: Carolynn Citrin, MD;  Location: [facility];  Service: Gynecology;  Laterality: N/A;   ESOPHAGOGASTRODUODENOSCOPY (EGD) WITH PROPOFOL  N/A 05/11/2017   Procedure: ESOPHAGOGASTRODUODENOSCOPY (EGD) WITH PROPOFOL ;  Surgeon: Marshall Skeeter, MD;  Location: [facility];  Service: Endoscopy;  Laterality: N/A;   sebaceous syst removed  08/24/2011   tonsillectomy     TONSILLECTOMY     Patient Active Problem List   Diagnosis Date Noted   Rheumatoid arthritis (HCC) 05/30/2023   Bilateral hand swelling 12/28/2022   Eczema 04/13/2018   Irritable bowel  syndrome with diarrhea 04/13/2018   Prediabetes 04/13/2018   PMB (postmenopausal bleeding) 10/05/2017   Gastro-esophageal reflux disease with esophagitis 05/13/2017   Strain of muscle of right hip 05/06/2017   Bilateral hearing loss 04/12/2017   Scoliosis of lumbosacral spine 03/30/2017   Abnormality of gait and mobility 03/30/2017   Allergic rhinitis 09/08/2015   Hypertension 09/08/2015   Gonalgia 09/08/2015   RAD (reactive airway disease) 09/08/2015   Obesity 03/12/2015   Current tear of meniscus 01/02/2015   Tear of meniscus of knee 01/02/2015    ONSET DATE: Years ago  REFERRING DIAG: R26.9 (ICD-10-CM) - Abnormal gait   THERAPY DIAG:  Generalized weakness  Abnormality of gait  Unsteadiness on feet  Rationale for Evaluation and Treatment: Rehabilitation  SUBJECTIVE:  SUBJECTIVE STATEMENT:  Pt reports a busy weekend and doing her HEP. No updates since last session. Is having some soreness.    From eval:  Patient reports she had previously went to [facility] to see Ortho and was referred for physical therapy, but due to logistical errors she did not start PT at that time.  Pt states these gait deviations started years ago. Reports no injury at the time they started. Pt states she has always walked a lot for exercise, but it is hard for her to do now. Pt states she has noticed her gait deviations because she has started getting L thigh pain, but otherwise does not have pain. Pt states these deviations impact her balance.  States she has difficulty walking slow because her gait deviations are more pronounced.   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 mother, and immobile husband due to him needing a hip replacement.  Pt accompanied  by: self  PERTINENT HISTORY: PMH: Rheumatoid arthritis, obesity, prediabetes, HTN  Per MD note on 12/13/2023: "Trendelenburg gait due to hip weakness Trendelenburg gait due to hip weakness identified. Previous recommendation for physical therapy was not followed through due to logistical issues. - Refer to physical therapy for hip strengthening exercises"  PAIN:  Are you having pain? None related to hip weakness, but will have pain in hands when overusing them due to RA  PRECAUTIONS: None  RED FLAGS: None   WEIGHT BEARING RESTRICTIONS: No  FALLS: Has patient fallen in last 6 months? Yes. Number of falls 1x tripped backwards over her cat  LIVING ENVIRONMENT: Lives with: lives with their spouse and lives with their son (husband and special needs son) Lives in: House/apartment - 1 level Stairs: Yes: External: 1 steps; on left going up Has following equipment at home: shower chair and Grab bars  PLOF: Independent, Independent with household mobility without device, and Independent with homemaking with ambulation  PATIENT GOALS: be able to walk with a normal gait  OBJECTIVE:  Note: Objective measures were completed at Evaluation unless otherwise noted.  DIAGNOSTIC FINDINGS: N/A  COGNITION: Overall cognitive status: Within functional limits for tasks assessed   SENSATION: WFL  COORDINATION: Not formally assessed  EDEMA:  Not formally assessed, but none observed  MUSCLE TONE: appears WNL  MUSCLE LENGTH: Not formally assessed During supine testing, it is possible L LE is slightly longer than R LE, but didn't formally measure  DTRs:  Not formally assessed  POSTURE: rounded shoulders and forward head Need to assess spine curvature in more detail  LOWER EXTREMITY ROM:     Active   WFL/WNL throughout Right Eval Left Eval  Hip flexion    Hip extension    Hip abduction    Hip adduction    Hip internal rotation    Hip external rotation    Knee flexion    Knee  extension    Ankle dorsiflexion    Ankle plantarflexion    Ankle inversion    Ankle eversion     (Blank rows = not tested)  LOWER EXTREMITY MMT:    MMT Right Eval Left Eval  Hip flexion 4- 4+  Hip extension    Hip abduction 2+  Tested in sidelying 4- Tested in sidelying   Hip adduction    Hip internal rotation    Hip external rotation 3+ 4  Knee flexion 5 5  Knee extension 5 5  Ankle dorsiflexion 5 5  Ankle plantarflexion 5 5  Ankle inversion    Ankle eversion    (Blank rows = not tested)  Manual Muscle Test Scale 0/5 = No muscle contraction can be seen or felt 1/5 = Contraction can be felt, but there is no motion 2-/5 = Part moves through incomplete ROM w/ gravity decreased 2/5 = Part moves through complete ROM w/ gravity decreased 2+/5 = Part moves through incomplete ROM (<50%) against gravity or through complete ROM w/ gravity 3-/5 = Part moves through incomplete ROM (>50%) against gravity 3/5 = Part moves through complete ROM against gravity 3+/5 = Part moves through complete ROM against gravity/slight resistance 4-/5= Holds test position against slight to moderate pressure 4/5 = Part moves through complete ROM against gravity/moderate resistance 4+/5= Holds test position against moderate to strong pressure 5/5 = Part moves through complete ROM against gravity/full resistance  BED MOBILITY:  Findings: Sit to supine Modified independence Supine to sit Modified independence  TRANSFERS: Sit to stand: Complete Independence  Assistive device utilized: None     Stand to sit: Complete Independence  Assistive device utilized: None     Chair to chair: Complete Independence  Assistive device utilized: None       RAMP:  Not tested  CURB:  Not tested  STAIRS: Findings: Level of Assistance: Modified independence, Stair Negotiation Technique: Alternating Pattern  Forwards with Single Rail on Right Single Rail on Left Bilateral Rails, Number of Stairs: 4, Height of  Stairs: 6in   , and Comments: pt varying in railings used, but reciprocal pattern throughout GAIT: Findings: Gait Characteristics: trendelenburg and lateral lean- Left, Distance walked: 152ft, Assistive device utilized:None, Level of assistance: Complete Independence, and Comments: L lateral trunk lean during every R stance phase due to R hip weakness and possible leg length discrepancy     FUNCTIONAL TESTS:  5 times sit to stand: 12.97 seconds 6 minute walk test: need to assess 10 meter walk test: 1.10m/s without AD Berg Balance Scale: need to assess Functional gait assessment: need to assess  PATIENT SURVEYS:  ABC scale 94.375% with pt reporting least confidence standing on chair to reach for something, stepping on/off escalator without holding on, and walking on ice - otherwise pt reporting 95-100% on other items                                                                                                                               TREATMENT DATE: 12/26/2023   TE Supine bridge with GTB around knees for increased gluteal recruitment   3 x 10   Adductor squeeze 2 x 10 x 5 sec   SL Hip ABD 2 x 10 on ea side, assistance for proper form   Bridge with march for hip stabilization 2 x 10 ( 5 leading with R then 5 leading with left)  - cues to bridge first to establish stable hip position prior to march.   NMR  Bird dog on  wall 2x 10 ea, difficulty with RLE on floor/ stabilizing,  - min to mod A from PT to stabilize hips with R LE stance   Standing side plank on wall on R side 3x30 sec ea  Standing holding to 9# dumbbell in unilateral UE and moving hips in frontal plane with feet on floor for postural awareness of hip drop and closed chain hip abduction activation x 10 ea side   - farmers carry in mirrors x 3 laps of 15 ft   UE support, lateral hip hike 2 x 15 ea side, using mirror for feedback on appropriate hip positioning.   PATIENT EDUCATION: Education details: Pt  educated throughout session about proper posture and technique with exercises. Improved exercise technique, movement at target joints, use of target muscles after min to mod verbal, visual, tactile cues. Person educated: Patient Education method: Explanation and Handouts Education comprehension: verbalized understanding and needs further education  HOME EXERCISE PROGRAM:  Access Code: [email or web address] URL: [email or web address] Date: 12/26/2023 Prepared by: Marlynn Singer  Exercises - Clamshell  - 1 x daily - 7 x weekly - 2 sets - 10 reps - Supine Bridge with Resistance Band  - 1 x daily - 7 x weekly - 2 sets - 10 reps - Supine Hip Adduction Isometric with Ball  - 1 x daily - 7 x weekly - 2 sets - 10 reps - 3-5 sec  hold - Standing Side Plank on Wall  - 1 x daily - 7 x weekly - 3 sets - 30 second  hold   GOALS: Goals reviewed with patient? Yes  SHORT TERM GOALS: Target date: 01/26/2024   Patient will be independent in home exercise program to improve strength/mobility for better functional independence with ADLs.  Baseline: 12/15/2023 initial HEP provided Goal status: INITIAL   LONG TERM GOALS: Target date: 03/08/2024  Patient will increase Berg Balance score to > 51/56 to demonstrate improved balance and decreased fall risk during functional activities and ADLs.  Baseline: need to assess Goal status: INITIAL  2.  Patient will increase six minute walk test distance to >1065ft for progression to community ambulator and improve gait ability  Baseline: 1080 ft  Goal status: INITIAL  3.  Patient will increase Functional Gait Assessment (FGA) score to >20/30 as to reduce fall risk and improve dynamic gait safety with community ambulation.  Baseline: 23 Goal status: MET  4.  Patient will maintain 30 seconds of single leg stance on each LE without loss of balance indicating increased hip strength and improved ability to ambulate and navigate stairs without trendelenburg  pattern. Baseline: L LE: 5 seconds, R LE: 0 seconds Goal status: INITIAL  5.  Patient will increase BLE gross strength to 4+/5 as to improve functional strength for independent gait, increased standing tolerance and increased ADL ability.  Baseline: see above Goal status: INITIAL   ASSESSMENT:  CLINICAL IMPRESSION:  Patient arrived with good motivation for completion of pt activities. Pt been working diligently on HEP. Pt continues with core and gluteal strengthening progression. Added side plank on wall as well as adductor ball squeeze to HEP and new handout provided. Worked on some reintegration postural awareness of hip positioning in mirror. Pt will continue to benefit from skilled physical therapy intervention to address impairments, improve QOL, and attain therapy goals.     OBJECTIVE IMPAIRMENTS: Abnormal gait, decreased activity tolerance, decreased balance, decreased endurance, decreased knowledge of condition, decreased mobility, difficulty walking, decreased strength, improper body mechanics, and pain.   ACTIVITY  LIMITATIONS: carrying, lifting, bending, standing, squatting, stairs, transfers, dressing, locomotion level, and caring for others  PARTICIPATION LIMITATIONS: cleaning, laundry, shopping, and community activity  PERSONAL FACTORS: Age, Sex, Time since onset of injury/illness/exacerbation, and 3+ comorbidities: Rheumatoid arthritis, obesity, prediabetes, HTN are also affecting patient's functional outcome.   REHAB POTENTIAL: Good  CLINICAL DECISION MAKING: Stable/uncomplicated  EVALUATION COMPLEXITY: Low  PLAN:  PT FREQUENCY: 1-2x/week  PT DURATION: 12 weeks  PLANNED INTERVENTIONS: [id number]- PT Re-evaluation, 97750- Physical Performance Testing, 97110-Therapeutic exercises, 97530- Therapeutic activity, [id number]- Neuromuscular re-education, 97535- Self Care, [id number]- Manual therapy, [id number]- Gait training, [phone]- Orthotic Initial, [phone]- Orthotic/Prosthetic subsequent,  [phone]- Canalith repositioning, [phone]- Electrical stimulation (manual), Patient/Family education, Balance training, Stair training, Taping, Dry Needling, Joint mobilization, Joint manipulation, Spinal mobilization, Vestibular training, DME instructions, Cryotherapy, Moist heat, and Biofeedback  PLAN FOR NEXT SESSION:  - Need to assess spine curvature in more detail - specifically measure leg length - discuss possible orthotic if needed  - FGA - focus on hip strengthening, specifically R hip abductors to decrease trendelenburg during gait   Edwina Gram PT ,DPT Physical Therapist- [facility]  [facility]   11:04 AM 12/26/23

## 2023-12-29 ENCOUNTER — Ambulatory Visit: Payer: PRIVATE HEALTH INSURANCE | Admitting: Physical Therapy

## 2023-12-29 DIAGNOSIS — R531 Weakness: Secondary | ICD-10-CM

## 2023-12-29 DIAGNOSIS — R269 Unspecified abnormalities of gait and mobility: Secondary | ICD-10-CM

## 2023-12-29 DIAGNOSIS — R2681 Unsteadiness on feet: Secondary | ICD-10-CM

## 2023-12-29 NOTE — Therapy (Signed)
 OUTPATIENT PHYSICAL THERAPY NEURO TREATMENT   Patient Name: Rita Bailey MRN: [id number] DOB:Jan 18, 1951, 73 y.o., female Today's Date: 12/29/2023   PCP: Mazie Speed, MD REFERRING PROVIDER: Mazie Speed, MD   END OF SESSION:   PT End of Session - 12/29/23 1321     Visit Number 5    Number of Visits 24    Date for PT Re-Evaluation 03/08/24    Progress Note Due on Visit 10    PT Start Time 1319    PT Stop Time 1358    PT Time Calculation (min) 39 min    Equipment Utilized During Treatment Gait belt    Activity Tolerance Patient tolerated treatment well    Behavior During Therapy WFL for tasks assessed/performed              Past Medical History:  Diagnosis Date   Allergy    Arthritis    Hypertension    Scoliosis of lumbosacral spine    Past Surgical History:  Procedure Laterality Date   CESAREAN SECTION     COLONOSCOPY WITH PROPOFOL  N/A 05/11/2017   Procedure: COLONOSCOPY WITH PROPOFOL ;  Surgeon: Marshall Skeeter, MD;  Location: [facility];  Service: Endoscopy;  Laterality: N/A;   COLONOSCOPY WITH PROPOFOL  N/A 12/16/2021   Procedure: COLONOSCOPY WITH PROPOFOL ;  Surgeon: Marshall Skeeter, MD;  Location: [facility];  Service: Endoscopy;  Laterality: N/A;   DILATATION & CURETTAGE/HYSTEROSCOPY WITH MYOSURE N/A 02/27/2016   Procedure: DILATATION & CURETTAGE/HYSTEROSCOPY WITH MYOSURE;  Surgeon: Carolynn Citrin, MD;  Location: [facility];  Service: Gynecology;  Laterality: N/A;   ESOPHAGOGASTRODUODENOSCOPY (EGD) WITH PROPOFOL  N/A 05/11/2017   Procedure: ESOPHAGOGASTRODUODENOSCOPY (EGD) WITH PROPOFOL ;  Surgeon: Marshall Skeeter, MD;  Location: [facility];  Service: Endoscopy;  Laterality: N/A;   sebaceous syst removed  08/24/2011   tonsillectomy     TONSILLECTOMY     Patient Active Problem List   Diagnosis Date Noted   Rheumatoid arthritis (HCC) 05/30/2023   Bilateral hand swelling 12/28/2022   Eczema 04/13/2018   Irritable bowel  syndrome with diarrhea 04/13/2018   Prediabetes 04/13/2018   PMB (postmenopausal bleeding) 10/05/2017   Gastro-esophageal reflux disease with esophagitis 05/13/2017   Strain of muscle of right hip 05/06/2017   Bilateral hearing loss 04/12/2017   Scoliosis of lumbosacral spine 03/30/2017   Abnormality of gait and mobility 03/30/2017   Allergic rhinitis 09/08/2015   Hypertension 09/08/2015   Gonalgia 09/08/2015   RAD (reactive airway disease) 09/08/2015   Obesity 03/12/2015   Current tear of meniscus 01/02/2015   Tear of meniscus of knee 01/02/2015    ONSET DATE: Years ago  REFERRING DIAG: R26.9 (ICD-10-CM) - Abnormal gait   THERAPY DIAG:  No diagnosis found.  Rationale for Evaluation and Treatment: Rehabilitation  SUBJECTIVE:  SUBJECTIVE STATEMENT:  Pt reports a busy weekend and doing her HEP. No updates since last session. Is having some soreness.    From eval:  Patient reports she had previously went to [facility] to see Ortho and was referred for physical therapy, but due to logistical errors she did not start PT at that time.  Pt states these gait deviations started years ago. Reports no injury at the time they started. Pt states she has always walked a lot for exercise, but it is hard for her to do now. Pt states she has noticed her gait deviations because she has started getting L thigh pain, but otherwise does not have pain. Pt states these deviations impact her balance.  States she has difficulty walking slow because her gait deviations are more pronounced.   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 mother, and immobile husband due to him needing a hip replacement.  Pt accompanied by: self  PERTINENT HISTORY: PMH: Rheumatoid  arthritis, obesity, prediabetes, HTN  Per MD note on 12/13/2023: "Trendelenburg gait due to hip weakness Trendelenburg gait due to hip weakness identified. Previous recommendation for physical therapy was not followed through due to logistical issues. - Refer to physical therapy for hip strengthening exercises"  PAIN:  Are you having pain? None related to hip weakness, but will have pain in hands when overusing them due to RA  PRECAUTIONS: None  RED FLAGS: None   WEIGHT BEARING RESTRICTIONS: No  FALLS: Has patient fallen in last 6 months? Yes. Number of falls 1x tripped backwards over her cat  LIVING ENVIRONMENT: Lives with: lives with their spouse and lives with their son (husband and special needs son) Lives in: House/apartment - 1 level Stairs: Yes: External: 1 steps; on left going up Has following equipment at home: shower chair and Grab bars  PLOF: Independent, Independent with household mobility without device, and Independent with homemaking with ambulation  PATIENT GOALS: be able to walk with a normal gait  OBJECTIVE:  Note: Objective measures were completed at Evaluation unless otherwise noted.  DIAGNOSTIC FINDINGS: N/A  COGNITION: Overall cognitive status: Within functional limits for tasks assessed   SENSATION: WFL  COORDINATION: Not formally assessed  EDEMA:  Not formally assessed, but none observed  MUSCLE TONE: appears WNL  MUSCLE LENGTH: Not formally assessed During supine testing, it is possible L LE is slightly longer than R LE, but didn't formally measure  DTRs:  Not formally assessed  POSTURE: rounded shoulders and forward head Need to assess spine curvature in more detail  LOWER EXTREMITY ROM:     Active   WFL/WNL throughout Right Eval Left Eval  Hip flexion    Hip extension    Hip abduction    Hip adduction    Hip internal rotation    Hip external rotation    Knee flexion    Knee extension    Ankle dorsiflexion    Ankle  plantarflexion    Ankle inversion    Ankle eversion     (Blank rows = not tested)  LOWER EXTREMITY MMT:    MMT Right Eval Left Eval  Hip flexion 4- 4+  Hip extension    Hip abduction 2+  Tested in sidelying 4- Tested in sidelying   Hip adduction    Hip internal rotation    Hip external rotation 3+ 4  Knee flexion 5 5  Knee extension 5 5  Ankle dorsiflexion 5 5  Ankle plantarflexion 5 5  Ankle inversion    Ankle eversion    (Blank rows = not tested)  Manual Muscle Test Scale 0/5 = No muscle contraction can be seen or felt 1/5 = Contraction can be felt, but there is no motion 2-/5 = Part moves through incomplete ROM w/ gravity decreased 2/5 = Part moves through complete ROM w/ gravity decreased 2+/5 = Part moves through incomplete ROM (<50%) against gravity or through complete ROM w/ gravity 3-/5 = Part moves through incomplete ROM (>50%) against gravity 3/5 = Part moves through complete ROM against gravity 3+/5 = Part moves through complete ROM against gravity/slight resistance 4-/5= Holds test position against slight to moderate pressure 4/5 = Part moves through complete ROM against gravity/moderate resistance 4+/5= Holds test position against moderate to strong pressure 5/5 = Part moves through complete ROM against gravity/full resistance  BED MOBILITY:  Findings: Sit to supine Modified independence Supine to sit Modified independence  TRANSFERS: Sit to stand: Complete Independence  Assistive device utilized: None     Stand to sit: Complete Independence  Assistive device utilized: None     Chair to chair: Complete Independence  Assistive device utilized: None       RAMP:  Not tested  CURB:  Not tested  STAIRS: Findings: Level of Assistance: Modified independence, Stair Negotiation Technique: Alternating Pattern  Forwards with Single Rail on Right Single Rail on Left Bilateral Rails, Number of Stairs: 4, Height of Stairs: 6in   , and Comments: pt varying in  railings used, but reciprocal pattern throughout GAIT: Findings: Gait Characteristics: trendelenburg and lateral lean- Left, Distance walked: 157ft, Assistive device utilized:None, Level of assistance: Complete Independence, and Comments: L lateral trunk lean during every R stance phase due to R hip weakness and possible leg length discrepancy     FUNCTIONAL TESTS:  5 times sit to stand: 12.97 seconds 6 minute walk test: need to assess 10 meter walk test: 1.21m/s without AD Berg Balance Scale: need to assess Functional gait assessment: need to assess  PATIENT SURVEYS:  ABC scale 94.375% with pt reporting least confidence standing on chair to reach for something, stepping on/off escalator without holding on, and walking on ice - otherwise pt reporting 95-100% on other items                                                                                                                               TREATMENT DATE: 12/29/2023   TE Supine bridge x 10 reps    Bridge with march for hip stabilization 2 x 10 ( 5 leading with R then 5 leading with left)  - cues to bridge first to establish stable hip position prior to march.   Adductor squeeze 2 x 20 x 5 sec   SL Hip ABD 2 x 10 on ea side, assistance for proper form   NMR  Bird dog on wall 2x 10 ea, difficulty with RLE on floor/  stabilizing,  - min to mod A from PT to stabilize hips with R LE stance   Standing side plank on wall on R side 3x30 sec ea  U UE support, lateral hip hike 2 x 10 ea side using ledge of // bar surface, min A from PT when on R LE  TA  Side stepping 2 laps in // bars with YTB at ankles, cues for eccentric control.   PATIENT EDUCATION: Education details: Pt educated throughout session about proper posture and technique with exercises. Improved exercise technique, movement at target joints, use of target muscles after min to mod verbal, visual, tactile cues. Person educated: Patient Education method:  Explanation and Handouts Education comprehension: verbalized understanding and needs further education  HOME EXERCISE PROGRAM:  Access Code: [email or web address] URL: [email or web address] Date: 12/26/2023 Prepared by: Marlynn Singer  Exercises - Clamshell  - 1 x daily - 7 x weekly - 2 sets - 10 reps - Supine Bridge with Resistance Band  - 1 x daily - 7 x weekly - 2 sets - 10 reps - Supine Hip Adduction Isometric with Ball  - 1 x daily - 7 x weekly - 2 sets - 10 reps - 3-5 sec  hold - Standing Side Plank on Wall  - 1 x daily - 7 x weekly - 3 sets - 30 second  hold   GOALS: Goals reviewed with patient? Yes  SHORT TERM GOALS: Target date: 01/26/2024   Patient will be independent in home exercise program to improve strength/mobility for better functional independence with ADLs.  Baseline: 12/15/2023 initial HEP provided Goal status: INITIAL   LONG TERM GOALS: Target date: 03/08/2024  Patient will increase Berg Balance score to > 51/56 to demonstrate improved balance and decreased fall risk during functional activities and ADLs.  Baseline: need to assess Goal status: INITIAL  2.  Patient will increase six minute walk test distance to >1014ft for progression to community ambulator and improve gait ability  Baseline: 1080 ft  Goal status: INITIAL  3.  Patient will increase Functional Gait Assessment (FGA) score to >20/30 as to reduce fall risk and improve dynamic gait safety with community ambulation.  Baseline: 23 Goal status: MET  4.  Patient will maintain 30 seconds of single leg stance on each LE without loss of balance indicating increased hip strength and improved ability to ambulate and navigate stairs without trendelenburg pattern. Baseline: L LE: 5 seconds, R LE: 0 seconds Goal status: INITIAL  5.  Patient will increase BLE gross strength to 4+/5 as to improve functional strength for independent gait, increased standing tolerance and increased ADL ability.  Baseline:  see above Goal status: INITIAL   ASSESSMENT:  CLINICAL IMPRESSION:  Patient arrived with good motivation for completion of pt activities. Pt been working diligently on HEP. Pt continues with core and gluteal strengthening progression. Added bridge with march to HEP as pt able to properly sequence movement today in session. Pt continues to show progress but still has significant weakness and atrophy in her right gluteal region. Pt does report some improvement in her gait at times, particulrly in the morning.  Pt will continue to benefit from skilled physical therapy intervention to address impairments, improve QOL, and attain therapy goals.     OBJECTIVE IMPAIRMENTS: Abnormal gait, decreased activity tolerance, decreased balance, decreased endurance, decreased knowledge of condition, decreased mobility, difficulty walking, decreased strength, improper body mechanics, and pain.   ACTIVITY LIMITATIONS: carrying, lifting, bending, standing, squatting, stairs, transfers, dressing, locomotion level, and caring  for others  PARTICIPATION LIMITATIONS: cleaning, laundry, shopping, and community activity  PERSONAL FACTORS: Age, Sex, Time since onset of injury/illness/exacerbation, and 3+ comorbidities: Rheumatoid arthritis, obesity, prediabetes, HTN are also affecting patient's functional outcome.   REHAB POTENTIAL: Good  CLINICAL DECISION MAKING: Stable/uncomplicated  EVALUATION COMPLEXITY: Low  PLAN:  PT FREQUENCY: 1-2x/week  PT DURATION: 12 weeks  PLANNED INTERVENTIONS: [id number]- PT Re-evaluation, 97750- Physical Performance Testing, 97110-Therapeutic exercises, 97530- Therapeutic activity, [id number]- Neuromuscular re-education, 97535- Self Care, [id number]- Manual therapy, [id number]- Gait training, [phone]- Orthotic Initial, [phone]- Orthotic/Prosthetic subsequent, [phone]- Canalith repositioning, [phone]- Electrical stimulation (manual), Patient/Family education, Balance training, Stair training, Taping, Dry  Needling, Joint mobilization, Joint manipulation, Spinal mobilization, Vestibular training, DME instructions, Cryotherapy, Moist heat, and Biofeedback  PLAN FOR NEXT SESSION:  - Need to assess spine curvature in more detail - specifically measure leg length - discuss possible orthotic if needed  - FGA - focus on hip strengthening, specifically R hip abductors to decrease trendelenburg during gait   Edwina Gram PT ,DPT Physical Therapist- [place]  [facility]   1:22 PM 12/29/23

## 2024-01-02 ENCOUNTER — Ambulatory Visit: Payer: PRIVATE HEALTH INSURANCE | Admitting: Physical Therapy

## 2024-01-02 DIAGNOSIS — R531 Weakness: Secondary | ICD-10-CM | POA: Diagnosis not present

## 2024-01-02 DIAGNOSIS — R2681 Unsteadiness on feet: Secondary | ICD-10-CM

## 2024-01-02 DIAGNOSIS — R269 Unspecified abnormalities of gait and mobility: Secondary | ICD-10-CM

## 2024-01-02 NOTE — Therapy (Signed)
 OUTPATIENT PHYSICAL THERAPY NEURO TREATMENT   Patient Name: Rita Bailey MRN: [id number] DOB:1951/07/29, 73 y.o., female Today's Date: 01/02/2024   PCP: Mazie Speed, MD REFERRING PROVIDER: Mazie Speed, MD   END OF SESSION:   PT End of Session - 01/02/24 1103     Visit Number 6    Number of Visits 24    Date for PT Re-Evaluation 03/08/24    Progress Note Due on Visit 10    PT Start Time 1104    PT Stop Time 1145    PT Time Calculation (min) 41 min    Equipment Utilized During Treatment Gait belt    Activity Tolerance Patient tolerated treatment well    Behavior During Therapy WFL for tasks assessed/performed               Past Medical History:  Diagnosis Date   Allergy    Arthritis    Hypertension    Scoliosis of lumbosacral spine    Past Surgical History:  Procedure Laterality Date   CESAREAN SECTION     COLONOSCOPY WITH PROPOFOL  N/A 05/11/2017   Procedure: COLONOSCOPY WITH PROPOFOL ;  Surgeon: Marshall Skeeter, MD;  Location: [facility];  Service: Endoscopy;  Laterality: N/A;   COLONOSCOPY WITH PROPOFOL  N/A 12/16/2021   Procedure: COLONOSCOPY WITH PROPOFOL ;  Surgeon: Marshall Skeeter, MD;  Location: [facility];  Service: Endoscopy;  Laterality: N/A;   DILATATION & CURETTAGE/HYSTEROSCOPY WITH MYOSURE N/A 02/27/2016   Procedure: DILATATION & CURETTAGE/HYSTEROSCOPY WITH MYOSURE;  Surgeon: Carolynn Citrin, MD;  Location: [facility];  Service: Gynecology;  Laterality: N/A;   ESOPHAGOGASTRODUODENOSCOPY (EGD) WITH PROPOFOL  N/A 05/11/2017   Procedure: ESOPHAGOGASTRODUODENOSCOPY (EGD) WITH PROPOFOL ;  Surgeon: Marshall Skeeter, MD;  Location: [facility];  Service: Endoscopy;  Laterality: N/A;   sebaceous syst removed  08/24/2011   tonsillectomy     TONSILLECTOMY     Patient Active Problem List   Diagnosis Date Noted   Rheumatoid arthritis (HCC) 05/30/2023   Bilateral hand swelling 12/28/2022   Eczema 04/13/2018   Irritable bowel  syndrome with diarrhea 04/13/2018   Prediabetes 04/13/2018   PMB (postmenopausal bleeding) 10/05/2017   Gastro-esophageal reflux disease with esophagitis 05/13/2017   Strain of muscle of right hip 05/06/2017   Bilateral hearing loss 04/12/2017   Scoliosis of lumbosacral spine 03/30/2017   Abnormality of gait and mobility 03/30/2017   Allergic rhinitis 09/08/2015   Hypertension 09/08/2015   Gonalgia 09/08/2015   RAD (reactive airway disease) 09/08/2015   Obesity 03/12/2015   Current tear of meniscus 01/02/2015   Tear of meniscus of knee 01/02/2015    ONSET DATE: Years ago  REFERRING DIAG: R26.9 (ICD-10-CM) - Abnormal gait   THERAPY DIAG:  Generalized weakness  Unsteadiness on feet  Abnormality of gait  Rationale for Evaluation and Treatment: Rehabilitation  SUBJECTIVE:  SUBJECTIVE STATEMENT:   Pt reports she is tired today after a busy weekend. Pt states she is not going to be back to therapy for ~1.5 weeks due to going to her beach house. Reports she is a little "sore" today in her low back from a combination of her HEP exercises and home projects.     From eval:  Patient reports she had previously went to [facility] to see Ortho and was referred for physical therapy, but due to logistical errors she did not start PT at that time.  Pt states these gait deviations started years ago. Reports no injury at the time they started. Pt states she has always walked a lot for exercise, but it is hard for her to do now. Pt states she has noticed her gait deviations because she has started getting L thigh pain, but otherwise does not have pain. Pt states these deviations impact her balance.  States she has difficulty walking slow because her gait deviations are more pronounced.   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 mother, and immobile husband due to him needing a hip replacement.  Pt accompanied by: self  PERTINENT HISTORY: PMH: Rheumatoid arthritis, obesity, prediabetes, HTN  Per MD note on 12/13/2023: "Trendelenburg gait due to hip weakness Trendelenburg gait due to hip weakness identified. Previous recommendation for physical therapy was not followed through due to logistical issues. - Refer to physical therapy for hip strengthening exercises"  PAIN:  Are you having pain? None related to hip weakness, but will have pain in hands when overusing them due to RA  PRECAUTIONS: None  RED FLAGS: None   WEIGHT BEARING RESTRICTIONS: No  FALLS: Has patient fallen in last 6 months? Yes. Number of falls 1x tripped backwards over her cat  LIVING ENVIRONMENT: Lives with: lives with their spouse and lives with their son (husband and special needs son) Lives in: House/apartment - 1 level Stairs: Yes: External: 1 steps; on left going up Has following equipment at home: shower chair and Grab bars  PLOF: Independent, Independent with household mobility without device, and Independent with homemaking with ambulation  PATIENT GOALS: be able to walk with a normal gait  OBJECTIVE:  Note: Objective measures were completed at Evaluation unless otherwise noted.  DIAGNOSTIC FINDINGS: N/A  COGNITION: Overall cognitive status: Within functional limits for tasks assessed   SENSATION: WFL  COORDINATION: Not formally assessed  EDEMA:  Not formally assessed, but none observed  MUSCLE TONE: appears WNL  MUSCLE LENGTH: Not formally assessed During supine testing, it is possible L LE is slightly longer than R LE, but didn't formally measure  DTRs:  Not formally assessed  POSTURE: rounded shoulders and forward head Need to assess spine curvature in more detail  LOWER EXTREMITY ROM:     Active   WFL/WNL throughout  Right Eval Left Eval  Hip flexion    Hip extension    Hip abduction    Hip adduction    Hip internal rotation    Hip external rotation    Knee flexion    Knee extension    Ankle dorsiflexion    Ankle plantarflexion    Ankle inversion    Ankle eversion     (Blank rows = not tested)  LOWER EXTREMITY MMT:    MMT Right Eval Left Eval  Hip flexion 4- 4+  Hip extension    Hip abduction 2+  Tested in sidelying 4-  Tested in sidelying   Hip adduction    Hip internal rotation    Hip external rotation 3+ 4  Knee flexion 5 5  Knee extension 5 5  Ankle dorsiflexion 5 5  Ankle plantarflexion 5 5  Ankle inversion    Ankle eversion    (Blank rows = not tested)  Manual Muscle Test Scale 0/5 = No muscle contraction can be seen or felt 1/5 = Contraction can be felt, but there is no motion 2-/5 = Part moves through incomplete ROM w/ gravity decreased 2/5 = Part moves through complete ROM w/ gravity decreased 2+/5 = Part moves through incomplete ROM (<50%) against gravity or through complete ROM w/ gravity 3-/5 = Part moves through incomplete ROM (>50%) against gravity 3/5 = Part moves through complete ROM against gravity 3+/5 = Part moves through complete ROM against gravity/slight resistance 4-/5= Holds test position against slight to moderate pressure 4/5 = Part moves through complete ROM against gravity/moderate resistance 4+/5= Holds test position against moderate to strong pressure 5/5 = Part moves through complete ROM against gravity/full resistance  BED MOBILITY:  Findings: Sit to supine Modified independence Supine to sit Modified independence  TRANSFERS: Sit to stand: Complete Independence  Assistive device utilized: None     Stand to sit: Complete Independence  Assistive device utilized: None     Chair to chair: Complete Independence  Assistive device utilized: None       RAMP:  Not tested  CURB:  Not tested  STAIRS: Findings: Level of Assistance: Modified  independence, Stair Negotiation Technique: Alternating Pattern  Forwards with Single Rail on Right Single Rail on Left Bilateral Rails, Number of Stairs: 4, Height of Stairs: 6in   , and Comments: pt varying in railings used, but reciprocal pattern throughout GAIT: Findings: Gait Characteristics: trendelenburg and lateral lean- Left, Distance walked: 173ft, Assistive device utilized:None, Level of assistance: Complete Independence, and Comments: L lateral trunk lean during every R stance phase due to R hip weakness and possible leg length discrepancy     FUNCTIONAL TESTS:  5 times sit to stand: 12.97 seconds 6 minute walk test: need to assess 10 meter walk test: 1.17m/s without AD Berg Balance Scale: need to assess Functional gait assessment: need to assess  PATIENT SURVEYS:  ABC scale 94.375% with pt reporting least confidence standing on chair to reach for something, stepping on/off escalator without holding on, and walking on ice - otherwise pt reporting 95-100% on other items                                                                                                                               TREATMENT DATE: 01/02/2024  B LE functional strengthening for improved R hip stability in standing and gait:  Supine bridge x 15 reps with x10reps "pulse" at end range Would benefit from continued use of GTB resistance around knees during this to activate R hip abductors  Bridge with march for  hip stabilization x 10 (10 reps per LE with a pause between each) cues to bridge first to establish stable hip position prior to march, then pt lowers back down prior to raising up again for next rep Could progress to sustaining bridge position during each rep added YTB around knees as noticed pt's R knee adducts when marching L LE up for biofeedback to activate R hip abductors and improvement noted  Sidelying clamshells with RTB resistance 2x10 reps per side Educated pt to add RTB to HEP at  home, at least for 1 set  SL Hip ABD 2 x 10 on ea side, cuing for proper form, but pt able to complete without physical assistance today! Although smaller ROM  Supine R LE figure-4 stretch for R hip due to pt reporting a lot of soreness, x1 minute  Side stepping 3 laps in // bars with YTB at ankles transitioned to around knees for increased hip abductor activation with pt reporting feeling it more, cues for eccentric control.  Provided pt with YTB to use for HEP Added this to HEP and provided updated printout   NMR  Bird dog on wall x 10 ea, difficulty with RLE on floor/ stabilizing,  - min to mod A from PT to stabilize hips with R LE stance   Standing side plank on wall on R side x30 sec ea Pt reports she feels this more in her shoulder/chest area  Single UE support, lateral hip hike 2 x 10 ea side using ledge of // bar surface, min A from PT when on R LE Cuing for pt to perform standing R hip abduction when performing it on L side for increased muscle recruitment    PATIENT EDUCATION: Education details: Pt educated throughout session about proper posture and technique with exercises. Improved exercise technique, movement at target joints, use of target muscles after min to mod verbal, visual, tactile cues. Person educated: Patient Education method: Explanation and Handouts Education comprehension: verbalized understanding and needs further education  HOME EXERCISE PROGRAM:  Access Code: [email or web address] URL: [email or web address] Date: 01/02/2024 Prepared by: Carlen Chasten  Exercises - Clamshell with Resistance  - 1 x daily - 7 x weekly - 2-3 sets - 10-15 reps - Supine Bridge with Resistance Band  - 1 x daily - 7 x weekly - 2 sets - 15 reps - Marching Bridge  - 1 x daily - 7 x weekly - 2 sets - 10 reps - Supine Hip Adduction Isometric with Ball  - 1 x daily - 7 x weekly - 2 sets - 10 reps - 3-5 sec  hold - Standing Side Plank on Wall  - 1 x daily - 7 x weekly - 3 sets  - 30 second  hold - Side Stepping with Resistance at Thighs and Counter Support  - 1 x daily - 7 x weekly - 2-3 sets - 10 reps   GOALS: Goals reviewed with patient? Yes  SHORT TERM GOALS: Target date: 01/26/2024   Patient will be independent in home exercise program to improve strength/mobility for better functional independence with ADLs.  Baseline: 12/15/2023 initial HEP provided Goal status: INITIAL   LONG TERM GOALS: Target date: 03/08/2024  Patient will increase Berg Balance score to > 51/56 to demonstrate improved balance and decreased fall risk during functional activities and ADLs.  Baseline: need to assess Goal status: INITIAL  2.  Patient will increase six minute walk test distance to >1061ft for progression to community ambulator and improve gait ability  Baseline: 1080 ft  Goal status: INITIAL  3.  Patient will increase Functional Gait Assessment (FGA) score to >20/30 as to reduce fall risk and improve dynamic gait safety with community ambulation.  Baseline: 23 Goal status: MET  4.  Patient will maintain 30 seconds of single leg stance on each LE without loss of balance indicating increased hip strength and improved ability to ambulate and navigate stairs without trendelenburg pattern. Baseline: L LE: 5 seconds, R LE: 0 seconds Goal status: INITIAL  5.  Patient will increase BLE gross strength to 4+/5 as to improve functional strength for independent gait, increased standing tolerance and increased ADL ability.  Baseline: see above Goal status: INITIAL   ASSESSMENT:  CLINICAL IMPRESSION: Patient arrived with good motivation for completion of pt activities and reports consistent/diligent compliance with HEP. Pt reporting some muscle soreness from HEP and preforming home projects. Pt continues with core and gluteal strengthening progression. Tolerated addition of resistance during clamshells and addition of lateral stepping with Tband resistance around knees to her  HEP. Pt continues to show progress but still has significant weakness and atrophy in her right gluteal region, specifically hip abductors. Pt does report some improvement in her gait at times, particulrly in the morning, but she doesn't have the muscular endurance to sustain it. Discussed progressing exercises with increased repetitions to improve muscle endurance.  Pt will continue to benefit from skilled physical therapy intervention to address impairments, improve QOL, and attain therapy goals.     OBJECTIVE IMPAIRMENTS: Abnormal gait, decreased activity tolerance, decreased balance, decreased endurance, decreased knowledge of condition, decreased mobility, difficulty walking, decreased strength, improper body mechanics, and pain.   ACTIVITY LIMITATIONS: carrying, lifting, bending, standing, squatting, stairs, transfers, dressing, locomotion level, and caring for others  PARTICIPATION LIMITATIONS: cleaning, laundry, shopping, and community activity  PERSONAL FACTORS: Age, Sex, Time since onset of injury/illness/exacerbation, and 3+ comorbidities: Rheumatoid arthritis, obesity, prediabetes, HTN are also affecting patient's functional outcome.   REHAB POTENTIAL: Good  CLINICAL DECISION MAKING: Stable/uncomplicated  EVALUATION COMPLEXITY: Low  PLAN:  PT FREQUENCY: 1-2x/week  PT DURATION: 12 weeks  PLANNED INTERVENTIONS: [id number]- PT Re-evaluation, 97750- Physical Performance Testing, 97110-Therapeutic exercises, 97530- Therapeutic activity, [id number]- Neuromuscular re-education, 97535- Self Care, [id number]- Manual therapy, [id number]- Gait training, [phone]- Orthotic Initial, [phone]- Orthotic/Prosthetic subsequent, [phone]- Canalith repositioning, [phone]- Electrical stimulation (manual), Patient/Family education, Balance training, Stair training, Taping, Dry Needling, Joint mobilization, Joint manipulation, Spinal mobilization, Vestibular training, DME instructions, Cryotherapy, Moist heat, and Biofeedback  PLAN  FOR NEXT SESSION:  - Need to assess spine curvature in more detail - specifically measure leg length - discuss possible orthotic if needed  - FGA - focus on hip strengthening, specifically R hip abductors to decrease trendelenburg during gait Continue   Isaiyah Feldhaus, PT, DPT, NCS, CSRS Physical Therapist - [place]  [facility]  12:54 PM 01/02/24

## 2024-01-05 ENCOUNTER — Ambulatory Visit: Payer: PRIVATE HEALTH INSURANCE | Admitting: Physical Therapy

## 2024-01-10 ENCOUNTER — Ambulatory Visit: Payer: PRIVATE HEALTH INSURANCE | Admitting: Physical Therapy

## 2024-01-17 ENCOUNTER — Ambulatory Visit: Payer: PRIVATE HEALTH INSURANCE | Admitting: Physical Therapy

## 2024-01-19 ENCOUNTER — Ambulatory Visit: Payer: PRIVATE HEALTH INSURANCE | Attending: Family Medicine | Admitting: Physical Therapy

## 2024-01-19 DIAGNOSIS — R531 Weakness: Secondary | ICD-10-CM | POA: Diagnosis present

## 2024-01-19 DIAGNOSIS — R269 Unspecified abnormalities of gait and mobility: Secondary | ICD-10-CM | POA: Insufficient documentation

## 2024-01-19 DIAGNOSIS — R2681 Unsteadiness on feet: Secondary | ICD-10-CM | POA: Diagnosis present

## 2024-01-19 NOTE — Therapy (Signed)
 OUTPATIENT PHYSICAL THERAPY NEURO TREATMENT   Patient Name: Rita Bailey MRN: [id number] DOB:13-Dec-1950, 73 y.o., female Today's Date: 01/19/2024   PCP: Mazie Speed, MD REFERRING PROVIDER: Mazie Speed, MD   END OF SESSION:   PT End of Session - 01/19/24 1308     Visit Number 7    Number of Visits 24    Date for PT Re-Evaluation 03/08/24    Progress Note Due on Visit 10    PT Start Time 1317    PT Stop Time 1357    PT Time Calculation (min) 40 min    Equipment Utilized During Treatment Gait belt    Activity Tolerance Patient tolerated treatment well    Behavior During Therapy WFL for tasks assessed/performed               Past Medical History:  Diagnosis Date   Allergy    Arthritis    Hypertension    Scoliosis of lumbosacral spine    Past Surgical History:  Procedure Laterality Date   CESAREAN SECTION     COLONOSCOPY WITH PROPOFOL  N/A 05/11/2017   Procedure: COLONOSCOPY WITH PROPOFOL ;  Surgeon: Marshall Skeeter, MD;  Location: [facility];  Service: Endoscopy;  Laterality: N/A;   COLONOSCOPY WITH PROPOFOL  N/A 12/16/2021   Procedure: COLONOSCOPY WITH PROPOFOL ;  Surgeon: Marshall Skeeter, MD;  Location: [facility];  Service: Endoscopy;  Laterality: N/A;   DILATATION & CURETTAGE/HYSTEROSCOPY WITH MYOSURE N/A 02/27/2016   Procedure: DILATATION & CURETTAGE/HYSTEROSCOPY WITH MYOSURE;  Surgeon: Carolynn Citrin, MD;  Location: [facility];  Service: Gynecology;  Laterality: N/A;   ESOPHAGOGASTRODUODENOSCOPY (EGD) WITH PROPOFOL  N/A 05/11/2017   Procedure: ESOPHAGOGASTRODUODENOSCOPY (EGD) WITH PROPOFOL ;  Surgeon: Marshall Skeeter, MD;  Location: [facility];  Service: Endoscopy;  Laterality: N/A;   sebaceous syst removed  08/24/2011   tonsillectomy     TONSILLECTOMY     Patient Active Problem List   Diagnosis Date Noted   Rheumatoid arthritis (HCC) 05/30/2023   Bilateral hand swelling 12/28/2022   Eczema 04/13/2018   Irritable bowel  syndrome with diarrhea 04/13/2018   Prediabetes 04/13/2018   PMB (postmenopausal bleeding) 10/05/2017   Gastro-esophageal reflux disease with esophagitis 05/13/2017   Strain of muscle of right hip 05/06/2017   Bilateral hearing loss 04/12/2017   Scoliosis of lumbosacral spine 03/30/2017   Abnormality of gait and mobility 03/30/2017   Allergic rhinitis 09/08/2015   Hypertension 09/08/2015   Gonalgia 09/08/2015   RAD (reactive airway disease) 09/08/2015   Obesity 03/12/2015   Current tear of meniscus 01/02/2015   Tear of meniscus of knee 01/02/2015    ONSET DATE: Years ago  REFERRING DIAG: R26.9 (ICD-10-CM) - Abnormal gait   THERAPY DIAG:  No diagnosis found.  Rationale for Evaluation and Treatment: Rehabilitation  SUBJECTIVE:  SUBJECTIVE STATEMENT:   Pt reports doing well today. Pt denies any recent falls/stumbles since prior session. Pt denies any updates to medications or medical appointment since prior session. Pt reports good compliance with HEP when time permits. [place] but was very busy when she was there. Did do hep though. Feels improvement with hip but it gives out over time with prolonged ambulation.    From eval:  Patient reports she had previously went to [facility] to see Ortho and was referred for physical therapy, but due to logistical errors she did not start PT at that time.  Pt states these gait deviations started years ago. Reports no injury at the time they started. Pt states she has always walked a lot for exercise, but it is hard for her to do now. Pt states she has noticed her gait deviations because she has started getting L thigh pain, but otherwise does not have pain. Pt states these deviations impact her balance.  States she has difficulty  walking slow because her gait deviations are more pronounced.   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 mother, and immobile husband due to him needing a hip replacement.  Pt accompanied by: self  PERTINENT HISTORY: PMH: Rheumatoid arthritis, obesity, prediabetes, HTN  Per MD note on 12/13/2023: "Trendelenburg gait due to hip weakness Trendelenburg gait due to hip weakness identified. Previous recommendation for physical therapy was not followed through due to logistical issues. - Refer to physical therapy for hip strengthening exercises"  PAIN:  Are you having pain? None related to hip weakness, but will have pain in hands when overusing them due to RA  PRECAUTIONS: None  RED FLAGS: None   WEIGHT BEARING RESTRICTIONS: No  FALLS: Has patient fallen in last 6 months? Yes. Number of falls 1x tripped backwards over her cat  LIVING ENVIRONMENT: Lives with: lives with their spouse and lives with their son (husband and special needs son) Lives in: House/apartment - 1 level Stairs: Yes: External: 1 steps; on left going up Has following equipment at home: shower chair and Grab bars  PLOF: Independent, Independent with household mobility without device, and Independent with homemaking with ambulation  PATIENT GOALS: be able to walk with a normal gait  OBJECTIVE:  Note: Objective measures were completed at Evaluation unless otherwise noted.  DIAGNOSTIC FINDINGS: N/A  COGNITION: Overall cognitive status: Within functional limits for tasks assessed   SENSATION: WFL  COORDINATION: Not formally assessed  EDEMA:  Not formally assessed, but none observed  MUSCLE TONE: appears WNL  MUSCLE LENGTH: Not formally assessed During supine testing, it is possible L LE is slightly longer than R LE, but didn't formally measure  DTRs:  Not formally assessed  POSTURE: rounded shoulders and forward head Need to assess spine  curvature in more detail  LOWER EXTREMITY ROM:     Active   WFL/WNL throughout Right Eval Left Eval  Hip flexion    Hip extension    Hip abduction    Hip adduction    Hip internal rotation    Hip external rotation    Knee flexion    Knee extension    Ankle dorsiflexion    Ankle plantarflexion    Ankle inversion    Ankle eversion     (Blank rows = not tested)  LOWER EXTREMITY MMT:    MMT Right Eval Left Eval  Hip flexion 4- 4+  Hip extension    Hip abduction 2+  Tested in sidelying 4- Tested in sidelying   Hip adduction    Hip internal rotation    Hip external rotation 3+ 4  Knee flexion 5 5  Knee extension 5 5  Ankle dorsiflexion 5 5  Ankle plantarflexion 5 5  Ankle inversion    Ankle eversion    (Blank rows = not tested)  Manual Muscle Test Scale 0/5 = No muscle contraction can be seen or felt 1/5 = Contraction can be felt, but there is no motion 2-/5 = Part moves through incomplete ROM w/ gravity decreased 2/5 = Part moves through complete ROM w/ gravity decreased 2+/5 = Part moves through incomplete ROM (<50%) against gravity or through complete ROM w/ gravity 3-/5 = Part moves through incomplete ROM (>50%) against gravity 3/5 = Part moves through complete ROM against gravity 3+/5 = Part moves through complete ROM against gravity/slight resistance 4-/5= Holds test position against slight to moderate pressure 4/5 = Part moves through complete ROM against gravity/moderate resistance 4+/5= Holds test position against moderate to strong pressure 5/5 = Part moves through complete ROM against gravity/full resistance  BED MOBILITY:  Findings: Sit to supine Modified independence Supine to sit Modified independence  TRANSFERS: Sit to stand: Complete Independence  Assistive device utilized: None     Stand to sit: Complete Independence  Assistive device utilized: None     Chair to chair: Complete Independence  Assistive device utilized: None       RAMP:   Not tested  CURB:  Not tested  STAIRS: Findings: Level of Assistance: Modified independence, Stair Negotiation Technique: Alternating Pattern  Forwards with Single Rail on Right Single Rail on Left Bilateral Rails, Number of Stairs: 4, Height of Stairs: 6in   , and Comments: pt varying in railings used, but reciprocal pattern throughout GAIT: Findings: Gait Characteristics: trendelenburg and lateral lean- Left, Distance walked: 168ft, Assistive device utilized:None, Level of assistance: Complete Independence, and Comments: L lateral trunk lean during every R stance phase due to R hip weakness and possible leg length discrepancy     FUNCTIONAL TESTS:  5 times sit to stand: 12.97 seconds 6 minute walk test: need to assess 10 meter walk test: 1.12m/s without AD Berg Balance Scale: need to assess Functional gait assessment: need to assess  PATIENT SURVEYS:  ABC scale 94.375% with pt reporting least confidence standing on chair to reach for something, stepping on/off escalator without holding on, and walking on ice - otherwise pt reporting 95-100% on other items                                                                                                                               TREATMENT DATE: 01/19/2024  B LE functional strengthening for improved R hip stability in standing and gait:  Pt reports improvements in gait but fatigues quickly  Supine bridge 3 x 15 reps with x10reps "pulse" at end  range Would benefit from continued use of GTB resistance around knees during this to activate R hip abductors Clamshell, tried GTB but R not strong enough did 3 x 12 with RTB  Bridge with march for hip stabilization 3 x 10 (10 reps per LE with a pause between each) cues to bridge first to establish stable hip position prior to march, then pt lowers back down prior to raising up again for next rep Could progress to sustaining bridge position during each rep  SL Hip ABD 2 x 10 on ea side,  patient able to get right lower extremity through partial range of motion of physical therapist assist with full range of motion has patient focus on eccentric control from full range of motion back to neutral positioning. Supine hip abduction and adduction in supine working on full range of motion for these activities.  Patient able to complete well with minimal cues for keeping proper hip alignment   TA- To improve functional movements patterns for everyday tasks   Side stepping x15 ea direction with YTB at ankles transitioned to around knees for increased hip abductor activation with pt reporting feeling it more, cues for eccentric control.    NMR   Standing side plank on wall on R side 2x45 sec ea Pt reports she feels this more in her shoulder/chest area   Treatment session focussed on building muscular endurance in target musculature.    PATIENT EDUCATION: Education details: Pt educated throughout session about proper posture and technique with exercises. Improved exercise technique, movement at target joints, use of target muscles after min to mod verbal, visual, tactile cues. Person educated: Patient Education method: Explanation and Handouts Education comprehension: verbalized understanding and needs further education  HOME EXERCISE PROGRAM:  Access Code: [email or web address] URL: [email or web address] Date: 01/02/2024 Prepared by: Carlen Chasten  Exercises - Clamshell with Resistance  - 1 x daily - 7 x weekly - 2-3 sets - 10-15 reps - Supine Bridge with Resistance Band  - 1 x daily - 7 x weekly - 2 sets - 15 reps - Marching Bridge  - 1 x daily - 7 x weekly - 2 sets - 10 reps - Supine Hip Adduction Isometric with Ball  - 1 x daily - 7 x weekly - 2 sets - 10 reps - 3-5 sec  hold - Standing Side Plank on Wall  - 1 x daily - 7 x weekly - 3 sets - 30 second  hold - Side Stepping with Resistance at Thighs and Counter Support  - 1 x daily - 7 x weekly - 2-3 sets - 10  reps   GOALS: Goals reviewed with patient? Yes  SHORT TERM GOALS: Target date: 01/26/2024   Patient will be independent in home exercise program to improve strength/mobility for better functional independence with ADLs.  Baseline: 12/15/2023 initial HEP provided Goal status: INITIAL   LONG TERM GOALS: Target date: 03/08/2024  Patient will increase Berg Balance score to > 51/56 to demonstrate improved balance and decreased fall risk during functional activities and ADLs.  Baseline: need to assess Goal status: INITIAL  2.  Patient will increase six minute walk test distance to >1067ft for progression to community ambulator and improve gait ability  Baseline: 1080 ft  Goal status: INITIAL  3.  Patient will increase Functional Gait Assessment (FGA) score to >20/30 as to reduce fall risk and improve dynamic gait safety with community ambulation.  Baseline: 23 Goal status: MET  4.  Patient will maintain 30 seconds of  single leg stance on each LE without loss of balance indicating increased hip strength and improved ability to ambulate and navigate stairs without trendelenburg pattern. Baseline: L LE: 5 seconds, R LE: 0 seconds Goal status: INITIAL  5.  Patient will increase BLE gross strength to 4+/5 as to improve functional strength for independent gait, increased standing tolerance and increased ADL ability.  Baseline: see above Goal status: INITIAL   ASSESSMENT:  CLINICAL IMPRESSION: Patient arrived with good motivation for completion of pt activities and reports consistent/diligent compliance with HEP.  Patient reports some improvement in her gait with exercises but does have some worsening symptoms with prolonged ambulation.  Physical therapy therefore focused on improving muscular endurance and target musculature this date.  Patient overall responded well was instructed to continue to progress reps and sets with her home exercise program but no exercises were changed or added this  date in particular.  Continue to assess if this is beneficial neck session and will continue to follow plan of strengthening hip abductors both with open and close chain activities with eventual progression to high-level functional activities as patient is able to and as she progresses. Pt will continue to benefit from skilled physical therapy intervention to address impairments, improve QOL, and attain therapy goals.      OBJECTIVE IMPAIRMENTS: Abnormal gait, decreased activity tolerance, decreased balance, decreased endurance, decreased knowledge of condition, decreased mobility, difficulty walking, decreased strength, improper body mechanics, and pain.   ACTIVITY LIMITATIONS: carrying, lifting, bending, standing, squatting, stairs, transfers, dressing, locomotion level, and caring for others  PARTICIPATION LIMITATIONS: cleaning, laundry, shopping, and community activity  PERSONAL FACTORS: Age, Sex, Time since onset of injury/illness/exacerbation, and 3+ comorbidities: Rheumatoid arthritis, obesity, prediabetes, HTN are also affecting patient's functional outcome.   REHAB POTENTIAL: Good  CLINICAL DECISION MAKING: Stable/uncomplicated  EVALUATION COMPLEXITY: Low  PLAN:  PT FREQUENCY: 1-2x/week  PT DURATION: 12 weeks  PLANNED INTERVENTIONS: [id number]- PT Re-evaluation, 97750- Physical Performance Testing, 97110-Therapeutic exercises, 97530- Therapeutic activity, [id number]- Neuromuscular re-education, 97535- Self Care, [id number]- Manual therapy, [id number]- Gait training, [phone]- Orthotic Initial, [phone]- Orthotic/Prosthetic subsequent, [phone]- Canalith repositioning, [phone]- Electrical stimulation (manual), Patient/Family education, Balance training, Stair training, Taping, Dry Needling, Joint mobilization, Joint manipulation, Spinal mobilization, Vestibular training, DME instructions, Cryotherapy, Moist heat, and Biofeedback  PLAN FOR NEXT SESSION:  - Need to assess spine curvature in more detail -  specifically measure leg length - discuss possible orthotic if needed  - FGA - focus on hip strengthening, specifically R hip abductors to decrease trendelenburg during gait    Note: Portions of this document were prepared using Dragon voice recognition software and although reviewed may contain unintentional dictation errors in syntax, grammar, or spelling.  Edwina Gram PT ,DPT Physical Therapist- [facility]   1:09 PM 01/19/24

## 2024-01-24 ENCOUNTER — Encounter: Payer: Self-pay | Admitting: Physical Therapy

## 2024-01-24 ENCOUNTER — Ambulatory Visit: Payer: PRIVATE HEALTH INSURANCE

## 2024-01-24 DIAGNOSIS — R269 Unspecified abnormalities of gait and mobility: Secondary | ICD-10-CM

## 2024-01-24 DIAGNOSIS — R531 Weakness: Secondary | ICD-10-CM | POA: Diagnosis not present

## 2024-01-24 DIAGNOSIS — R2681 Unsteadiness on feet: Secondary | ICD-10-CM

## 2024-01-24 NOTE — Therapy (Signed)
 OUTPATIENT PHYSICAL THERAPY NEURO TREATMENT   Patient Name: Rita Bailey MRN: [id number] DOB:1951/02/28, 73 y.o., female Today's Date: 01/24/2024   PCP: Mazie Speed, MD REFERRING PROVIDER: Mazie Speed, MD   END OF SESSION:   PT End of Session - 01/24/24 1359     Visit Number 8    Number of Visits 24    Date for PT Re-Evaluation 03/08/24    Progress Note Due on Visit 10    PT Start Time 1315    PT Stop Time 1400    PT Time Calculation (min) 45 min    Equipment Utilized During Treatment Gait belt    Activity Tolerance Patient tolerated treatment well    Behavior During Therapy WFL for tasks assessed/performed                Past Medical History:  Diagnosis Date   Allergy    Arthritis    Hypertension    Scoliosis of lumbosacral spine    Past Surgical History:  Procedure Laterality Date   CESAREAN SECTION     COLONOSCOPY WITH PROPOFOL  N/A 05/11/2017   Procedure: COLONOSCOPY WITH PROPOFOL ;  Surgeon: Marshall Skeeter, MD;  Location: [facility];  Service: Endoscopy;  Laterality: N/A;   COLONOSCOPY WITH PROPOFOL  N/A 12/16/2021   Procedure: COLONOSCOPY WITH PROPOFOL ;  Surgeon: Marshall Skeeter, MD;  Location: [facility];  Service: Endoscopy;  Laterality: N/A;   DILATATION & CURETTAGE/HYSTEROSCOPY WITH MYOSURE N/A 02/27/2016   Procedure: DILATATION & CURETTAGE/HYSTEROSCOPY WITH MYOSURE;  Surgeon: Carolynn Citrin, MD;  Location: [facility];  Service: Gynecology;  Laterality: N/A;   ESOPHAGOGASTRODUODENOSCOPY (EGD) WITH PROPOFOL  N/A 05/11/2017   Procedure: ESOPHAGOGASTRODUODENOSCOPY (EGD) WITH PROPOFOL ;  Surgeon: Marshall Skeeter, MD;  Location: [facility];  Service: Endoscopy;  Laterality: N/A;   sebaceous syst removed  08/24/2011   tonsillectomy     TONSILLECTOMY     Patient Active Problem List   Diagnosis Date Noted   Rheumatoid arthritis (HCC) 05/30/2023   Bilateral hand swelling 12/28/2022   Eczema 04/13/2018   Irritable  bowel syndrome with diarrhea 04/13/2018   Prediabetes 04/13/2018   PMB (postmenopausal bleeding) 10/05/2017   Gastro-esophageal reflux disease with esophagitis 05/13/2017   Strain of muscle of right hip 05/06/2017   Bilateral hearing loss 04/12/2017   Scoliosis of lumbosacral spine 03/30/2017   Abnormality of gait and mobility 03/30/2017   Allergic rhinitis 09/08/2015   Hypertension 09/08/2015   Gonalgia 09/08/2015   RAD (reactive airway disease) 09/08/2015   Obesity 03/12/2015   Current tear of meniscus 01/02/2015   Tear of meniscus of knee 01/02/2015    ONSET DATE: Years ago  REFERRING DIAG: R26.9 (ICD-10-CM) - Abnormal gait   THERAPY DIAG:  Generalized weakness  Unsteadiness on feet  Abnormality of gait  Rationale for Evaluation and Treatment: Rehabilitation  SUBJECTIVE:  SUBJECTIVE STATEMENT: Pt reports she is doing well. Has questions about continuous muscle soreness and LE fatigue with HEP completion.   From eval:  Patient reports she had previously went to [facility] to see Ortho and was referred for physical therapy, but due to logistical errors she did not start PT at that time.  Pt states these gait deviations started years ago. Reports no injury at the time they started. Pt states she has always walked a lot for exercise, but it is hard for her to do now. Pt states she has noticed her gait deviations because she has started getting L thigh pain, but otherwise does not have pain. Pt states these deviations impact her balance.  States she has difficulty walking slow because her gait deviations are more pronounced.   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 mother, and immobile husband due to him needing a hip  replacement.  Pt accompanied by: self  PERTINENT HISTORY: PMH: Rheumatoid arthritis, obesity, prediabetes, HTN  Per MD note on 12/13/2023: "Trendelenburg gait due to hip weakness Trendelenburg gait due to hip weakness identified. Previous recommendation for physical therapy was not followed through due to logistical issues. - Refer to physical therapy for hip strengthening exercises"  PAIN:  Are you having pain? None related to hip weakness, but will have pain in hands when overusing them due to RA  PRECAUTIONS: None  RED FLAGS: None   WEIGHT BEARING RESTRICTIONS: No  FALLS: Has patient fallen in last 6 months? Yes. Number of falls 1x tripped backwards over her cat  LIVING ENVIRONMENT: Lives with: lives with their spouse and lives with their son (husband and special needs son) Lives in: House/apartment - 1 level Stairs: Yes: External: 1 steps; on left going up Has following equipment at home: shower chair and Grab bars  PLOF: Independent, Independent with household mobility without device, and Independent with homemaking with ambulation  PATIENT GOALS: be able to walk with a normal gait  OBJECTIVE:  Note: Objective measures were completed at Evaluation unless otherwise noted.  DIAGNOSTIC FINDINGS: N/A  COGNITION: Overall cognitive status: Within functional limits for tasks assessed   SENSATION: WFL  COORDINATION: Not formally assessed  EDEMA:  Not formally assessed, but none observed  MUSCLE TONE: appears WNL  MUSCLE LENGTH: Not formally assessed During supine testing, it is possible L LE is slightly longer than R LE, but didn't formally measure  DTRs:  Not formally assessed  POSTURE: rounded shoulders and forward head Need to assess spine curvature in more detail  LOWER EXTREMITY ROM:     Active   WFL/WNL throughout Right Eval Left Eval  Hip flexion    Hip extension    Hip abduction    Hip adduction    Hip internal rotation    Hip external  rotation    Knee flexion    Knee extension    Ankle dorsiflexion    Ankle plantarflexion    Ankle inversion    Ankle eversion     (Blank rows = not tested)  LOWER EXTREMITY MMT:    MMT Right Eval Left Eval  Hip flexion 4- 4+  Hip extension    Hip abduction 2+  Tested in sidelying 4- Tested in sidelying   Hip adduction    Hip internal rotation    Hip external rotation 3+ 4  Knee flexion 5 5  Knee extension 5 5  Ankle dorsiflexion 5 5  Ankle  plantarflexion 5 5  Ankle inversion    Ankle eversion    (Blank rows = not tested)  Manual Muscle Test Scale 0/5 = No muscle contraction can be seen or felt 1/5 = Contraction can be felt, but there is no motion 2-/5 = Part moves through incomplete ROM w/ gravity decreased 2/5 = Part moves through complete ROM w/ gravity decreased 2+/5 = Part moves through incomplete ROM (<50%) against gravity or through complete ROM w/ gravity 3-/5 = Part moves through incomplete ROM (>50%) against gravity 3/5 = Part moves through complete ROM against gravity 3+/5 = Part moves through complete ROM against gravity/slight resistance 4-/5= Holds test position against slight to moderate pressure 4/5 = Part moves through complete ROM against gravity/moderate resistance 4+/5= Holds test position against moderate to strong pressure 5/5 = Part moves through complete ROM against gravity/full resistance  BED MOBILITY:  Findings: Sit to supine Modified independence Supine to sit Modified independence  TRANSFERS: Sit to stand: Complete Independence  Assistive device utilized: None     Stand to sit: Complete Independence  Assistive device utilized: None     Chair to chair: Complete Independence  Assistive device utilized: None       RAMP:  Not tested  CURB:  Not tested  STAIRS: Findings: Level of Assistance: Modified independence, Stair Negotiation Technique: Alternating Pattern  Forwards with Single Rail on Right Single Rail on Left Bilateral  Rails, Number of Stairs: 4, Height of Stairs: 6in   , and Comments: pt varying in railings used, but reciprocal pattern throughout GAIT: Findings: Gait Characteristics: trendelenburg and lateral lean- Left, Distance walked: 165ft, Assistive device utilized:None, Level of assistance: Complete Independence, and Comments: L lateral trunk lean during every R stance phase due to R hip weakness and possible leg length discrepancy     FUNCTIONAL TESTS:  5 times sit to stand: 12.97 seconds 6 minute walk test: need to assess 10 meter walk test: 1.75m/s without AD Berg Balance Scale: need to assess Functional gait assessment: need to assess  PATIENT SURVEYS:  ABC scale 94.375% with pt reporting least confidence standing on chair to reach for something, stepping on/off escalator without holding on, and walking on ice - otherwise pt reporting 95-100% on other items                                                                                                                               TREATMENT DATE: 01/24/2024  There.Ex:   Began session discussing importance of rest breaks with HEP with regular resistance training for muscle recovery. PT reports continuous soreness and muscular fatigue doing HEP 7 days/week.   Glut bridges: 3x15 with GTB at distal femurs  Side lying hip abduction: 3x15, LLE. 3x8 RLE needing minA for full range.    There.Act:   Resisted gait forwards 3 reps, 12.5 lbs/R lateral 2 reps, 7.5/L lateral: 2 reps, 7.5   SLS on airex pad for hip stability with  toe support on airex pad: R/L, 3x30 sec, SBA   PATIENT EDUCATION: Education details: Pt educated throughout session about proper posture and technique with exercises. Improved exercise technique, movement at target joints, use of target muscles after min to mod verbal, visual, tactile cues. Person educated: Patient Education method: Explanation and Handouts Education comprehension: verbalized understanding and needs  further education  HOME EXERCISE PROGRAM:  Access Code: [email or web address] URL: [email or web address] Date: 01/02/2024 Prepared by: Carlen Chasten  Exercises - Clamshell with Resistance  - 1 x daily - 7 x weekly - 2-3 sets - 10-15 reps - Supine Bridge with Resistance Band  - 1 x daily - 7 x weekly - 2 sets - 15 reps - Marching Bridge  - 1 x daily - 7 x weekly - 2 sets - 10 reps - Supine Hip Adduction Isometric with Ball  - 1 x daily - 7 x weekly - 2 sets - 10 reps - 3-5 sec  hold - Standing Side Plank on Wall  - 1 x daily - 7 x weekly - 3 sets - 30 second  hold - Side Stepping with Resistance at Thighs and Counter Support  - 1 x daily - 7 x weekly - 2-3 sets - 10 reps   GOALS: Goals reviewed with patient? Yes  SHORT TERM GOALS: Target date: 01/26/2024   Patient will be independent in home exercise program to improve strength/mobility for better functional independence with ADLs.  Baseline: 12/15/2023 initial HEP provided Goal status: INITIAL   LONG TERM GOALS: Target date: 03/08/2024  Patient will increase Berg Balance score to > 51/56 to demonstrate improved balance and decreased fall risk during functional activities and ADLs.  Baseline: need to assess Goal status: INITIAL  2.  Patient will increase six minute walk test distance to >1079ft for progression to community ambulator and improve gait ability  Baseline: 1080 ft  Goal status: INITIAL  3.  Patient will increase Functional Gait Assessment (FGA) score to >20/30 as to reduce fall risk and improve dynamic gait safety with community ambulation.  Baseline: 23 Goal status: MET  4.  Patient will maintain 30 seconds of single leg stance on each LE without loss of balance indicating increased hip strength and improved ability to ambulate and navigate stairs without trendelenburg pattern. Baseline: L LE: 5 seconds, R LE: 0 seconds Goal status: INITIAL  5.  Patient will increase BLE gross strength to 4+/5 as to improve  functional strength for independent gait, increased standing tolerance and increased ADL ability.  Baseline: see above Goal status: INITIAL   ASSESSMENT:  CLINICAL IMPRESSION: Continuing PT POC working on R hip strength. Pt continues to fatigue quickly in R hip abduction in [place] and CKC activities needing modifications/min assist to complete safely/correctly. Discussion made today on DOMS and regular rest days for tissue healing/muscle recovery to assist in reduced soreness and fatigue as pt endorses 7 days/week HEP completion. Pt understanding of education and excellent motivation throughout session. Pt continues to have significant R trendelenburg gait due to hip weakness leading to increased falls risk. Pt will continue to benefit from skilled physical therapy intervention to address impairments, improve QOL, and attain therapy goals.     OBJECTIVE IMPAIRMENTS: Abnormal gait, decreased activity tolerance, decreased balance, decreased endurance, decreased knowledge of condition, decreased mobility, difficulty walking, decreased strength, improper body mechanics, and pain.   ACTIVITY LIMITATIONS: carrying, lifting, bending, standing, squatting, stairs, transfers, dressing, locomotion level, and caring for others  PARTICIPATION LIMITATIONS: cleaning, laundry, shopping, and community activity  PERSONAL FACTORS: Age, Sex, Time since onset of injury/illness/exacerbation, and 3+ comorbidities: Rheumatoid arthritis, obesity, prediabetes, HTN are also affecting patient's functional outcome.   REHAB POTENTIAL: Good  CLINICAL DECISION MAKING: Stable/uncomplicated  EVALUATION COMPLEXITY: Low  PLAN:  PT FREQUENCY: 1-2x/week  PT DURATION: 12 weeks  PLANNED INTERVENTIONS: [id number]- PT Re-evaluation, 97750- Physical Performance Testing, 97110-Therapeutic exercises, 97530- Therapeutic activity, [id number]- Neuromuscular re-education, 97535- Self Care, [id number]- Manual therapy, [id number]- Gait training, [phone]-  Orthotic Initial, [phone]- Orthotic/Prosthetic subsequent, [phone]- Canalith repositioning, [phone]- Electrical stimulation (manual), Patient/Family education, Balance training, Stair training, Taping, Dry Needling, Joint mobilization, Joint manipulation, Spinal mobilization, Vestibular training, DME instructions, Cryotherapy, Moist heat, and Biofeedback  PLAN FOR NEXT SESSION:  - Need to assess spine curvature in more detail - specifically measure leg length - discuss possible orthotic if needed  - FGA - focus on hip strengthening, specifically R hip abductors to decrease trendelenburg during gait   Marc Senior. Fairly IV, PT, DPT Physical Therapist-   [facility] 2:17 PM 01/24/24

## 2024-01-26 ENCOUNTER — Encounter: Payer: Self-pay | Admitting: Physical Therapy

## 2024-01-26 ENCOUNTER — Ambulatory Visit: Payer: PRIVATE HEALTH INSURANCE

## 2024-01-26 DIAGNOSIS — R2681 Unsteadiness on feet: Secondary | ICD-10-CM

## 2024-01-26 DIAGNOSIS — R531 Weakness: Secondary | ICD-10-CM

## 2024-01-26 DIAGNOSIS — R269 Unspecified abnormalities of gait and mobility: Secondary | ICD-10-CM

## 2024-01-26 NOTE — Therapy (Signed)
 OUTPATIENT PHYSICAL THERAPY NEURO TREATMENT   Patient Name: Rita Bailey MRN: [id number] DOB:March 29, 1951, 72 y.o., female Today's Date: 01/26/2024   PCP: Mazie Speed, MD REFERRING PROVIDER: Mazie Speed, MD   END OF SESSION:   PT End of Session - 01/26/24 1305     Visit Number 9    Number of Visits 24    Date for PT Re-Evaluation 03/08/24    Progress Note Due on Visit 10    PT Start Time 1312    PT Stop Time 1355    PT Time Calculation (min) 43 min    Equipment Utilized During Treatment Gait belt    Activity Tolerance Patient tolerated treatment well    Behavior During Therapy WFL for tasks assessed/performed             Past Medical History:  Diagnosis Date   Allergy    Arthritis    Hypertension    Scoliosis of lumbosacral spine    Past Surgical History:  Procedure Laterality Date   CESAREAN SECTION     COLONOSCOPY WITH PROPOFOL  N/A 05/11/2017   Procedure: COLONOSCOPY WITH PROPOFOL ;  Surgeon: Marshall Skeeter, MD;  Location: [facility];  Service: Endoscopy;  Laterality: N/A;   COLONOSCOPY WITH PROPOFOL  N/A 12/16/2021   Procedure: COLONOSCOPY WITH PROPOFOL ;  Surgeon: Marshall Skeeter, MD;  Location: [facility];  Service: Endoscopy;  Laterality: N/A;   DILATATION & CURETTAGE/HYSTEROSCOPY WITH MYOSURE N/A 02/27/2016   Procedure: DILATATION & CURETTAGE/HYSTEROSCOPY WITH MYOSURE;  Surgeon: Carolynn Citrin, MD;  Location: [facility];  Service: Gynecology;  Laterality: N/A;   ESOPHAGOGASTRODUODENOSCOPY (EGD) WITH PROPOFOL  N/A 05/11/2017   Procedure: ESOPHAGOGASTRODUODENOSCOPY (EGD) WITH PROPOFOL ;  Surgeon: Marshall Skeeter, MD;  Location: [facility];  Service: Endoscopy;  Laterality: N/A;   sebaceous syst removed  08/24/2011   tonsillectomy     TONSILLECTOMY     Patient Active Problem List   Diagnosis Date Noted   Rheumatoid arthritis (HCC) 05/30/2023   Bilateral hand swelling 12/28/2022   Eczema 04/13/2018   Irritable bowel  syndrome with diarrhea 04/13/2018   Prediabetes 04/13/2018   PMB (postmenopausal bleeding) 10/05/2017   Gastro-esophageal reflux disease with esophagitis 05/13/2017   Strain of muscle of right hip 05/06/2017   Bilateral hearing loss 04/12/2017   Scoliosis of lumbosacral spine 03/30/2017   Abnormality of gait and mobility 03/30/2017   Allergic rhinitis 09/08/2015   Hypertension 09/08/2015   Gonalgia 09/08/2015   RAD (reactive airway disease) 09/08/2015   Obesity 03/12/2015   Current tear of meniscus 01/02/2015   Tear of meniscus of knee 01/02/2015    ONSET DATE: Years ago  REFERRING DIAG: R26.9 (ICD-10-CM) - Abnormal gait   THERAPY DIAG:  Generalized weakness  Unsteadiness on feet  Abnormality of gait  Rationale for Evaluation and Treatment: Rehabilitation  SUBJECTIVE:  SUBJECTIVE STATEMENT: Pt reports some soreness after last session. Still present some but mild. No pain today. Has been busy outside.  From eval:  Patient reports she had previously went to [facility] to see Ortho and was referred for physical therapy, but due to logistical errors she did not start PT at that time.  Pt states these gait deviations started years ago. Reports no injury at the time they started. Pt states she has always walked a lot for exercise, but it is hard for her to do now. Pt states she has noticed her gait deviations because she has started getting L thigh pain, but otherwise does not have pain. Pt states these deviations impact her balance.  States she has difficulty walking slow because her gait deviations are more pronounced.   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 mother, and immobile husband due to him needing a hip replacement.  Pt  accompanied by: self  PERTINENT HISTORY: PMH: Rheumatoid arthritis, obesity, prediabetes, HTN  Per MD note on 12/13/2023: Trendelenburg gait due to hip weakness Trendelenburg gait due to hip weakness identified. Previous recommendation for physical therapy was not followed through due to logistical issues. - Refer to physical therapy for hip strengthening exercises  PAIN:  Are you having pain? None related to hip weakness, but will have pain in hands when overusing them due to RA  PRECAUTIONS: None  RED FLAGS: None   WEIGHT BEARING RESTRICTIONS: No  FALLS: Has patient fallen in last 6 months? Yes. Number of falls 1x tripped backwards over her cat  LIVING ENVIRONMENT: Lives with: lives with their spouse and lives with their son (husband and special needs son) Lives in: House/apartment - 1 level Stairs: Yes: External: 1 steps; on left going up Has following equipment at home: shower chair and Grab bars  PLOF: Independent, Independent with household mobility without device, and Independent with homemaking with ambulation  PATIENT GOALS: be able to walk with a normal gait  OBJECTIVE:  Note: Objective measures were completed at Evaluation unless otherwise noted.  DIAGNOSTIC FINDINGS: N/A  COGNITION: Overall cognitive status: Within functional limits for tasks assessed   SENSATION: WFL  COORDINATION: Not formally assessed  EDEMA:  Not formally assessed, but none observed  MUSCLE TONE: appears WNL  MUSCLE LENGTH: Not formally assessed During supine testing, it is possible L LE is slightly longer than R LE, but didn't formally measure  DTRs:  Not formally assessed  POSTURE: rounded shoulders and forward head Need to assess spine curvature in more detail  LOWER EXTREMITY ROM:     Active   WFL/WNL throughout Right Eval Left Eval  Hip flexion    Hip extension    Hip abduction    Hip adduction    Hip internal rotation    Hip external rotation    Knee  flexion    Knee extension    Ankle dorsiflexion    Ankle plantarflexion    Ankle inversion    Ankle eversion     (Blank rows = not tested)  LOWER EXTREMITY MMT:    MMT Right Eval Left Eval  Hip flexion 4- 4+  Hip extension    Hip abduction 2+  Tested in sidelying 4- Tested in sidelying   Hip adduction    Hip internal rotation    Hip external rotation 3+ 4  Knee flexion 5 5  Knee extension 5 5  Ankle dorsiflexion 5 5  Ankle  plantarflexion 5 5  Ankle inversion    Ankle eversion    (Blank rows = not tested)  Manual Muscle Test Scale 0/5 = No muscle contraction can be seen or felt 1/5 = Contraction can be felt, but there is no motion 2-/5 = Part moves through incomplete ROM w/ gravity decreased 2/5 = Part moves through complete ROM w/ gravity decreased 2+/5 = Part moves through incomplete ROM (<50%) against gravity or through complete ROM w/ gravity 3-/5 = Part moves through incomplete ROM (>50%) against gravity 3/5 = Part moves through complete ROM against gravity 3+/5 = Part moves through complete ROM against gravity/slight resistance 4-/5= Holds test position against slight to moderate pressure 4/5 = Part moves through complete ROM against gravity/moderate resistance 4+/5= Holds test position against moderate to strong pressure 5/5 = Part moves through complete ROM against gravity/full resistance  BED MOBILITY:  Findings: Sit to supine Modified independence Supine to sit Modified independence  TRANSFERS: Sit to stand: Complete Independence  Assistive device utilized: None     Stand to sit: Complete Independence  Assistive device utilized: None     Chair to chair: Complete Independence  Assistive device utilized: None       RAMP:  Not tested  CURB:  Not tested  STAIRS: Findings: Level of Assistance: Modified independence, Stair Negotiation Technique: Alternating Pattern  Forwards with Single Rail on Right Single Rail on Left Bilateral Rails, Number of  Stairs: 4, Height of Stairs: 6in   , and Comments: pt varying in railings used, but reciprocal pattern throughout GAIT: Findings: Gait Characteristics: trendelenburg and lateral lean- Left, Distance walked: 161ft, Assistive device utilized:None, Level of assistance: Complete Independence, and Comments: L lateral trunk lean during every R stance phase due to R hip weakness and possible leg length discrepancy     FUNCTIONAL TESTS:  5 times sit to stand: 12.97 seconds 6 minute walk test: need to assess 10 meter walk test: 1.74m/s without AD Berg Balance Scale: need to assess Functional gait assessment: need to assess  PATIENT SURVEYS:  ABC scale 94.375% with pt reporting least confidence standing on chair to reach for something, stepping on/off escalator without holding on, and walking on ice - otherwise pt reporting 95-100% on other items                                                                                                                               TREATMENT DATE: 01/26/2024  There.Ex:  Nu-step L2 for 5 min for warm up due to muscular soreness. Not billed.   Squat with GTB at distal femurs: 2x10   Side lying hip abduction: 3x8 RLE needing minA for full range.   STS with RLE under BOS: 3x10, SBA   There.Act:  Resisted side steps: GTB at distal femurs , 4x10' CGA    SLS on airex pad for hip stability with toe support on airex pad: R/L, 3x30 sec, SBA   PATIENT EDUCATION:  Education details: Pt educated throughout session about proper posture and technique with exercises. Improved exercise technique, movement at target joints, use of target muscles after min to mod verbal, visual, tactile cues. Person educated: Patient Education method: Explanation and Handouts Education comprehension: verbalized understanding and needs further education  HOME EXERCISE PROGRAM:  Access Code: [email or web address] URL: [email or web address] Date: 01/02/2024 Prepared by: Carlen Chasten  Exercises - Clamshell with Resistance  - 1 x daily - 7 x weekly - 2-3 sets - 10-15 reps - Supine Bridge with Resistance Band  - 1 x daily - 7 x weekly - 2 sets - 15 reps - Marching Bridge  - 1 x daily - 7 x weekly - 2 sets - 10 reps - Supine Hip Adduction Isometric with Ball  - 1 x daily - 7 x weekly - 2 sets - 10 reps - 3-5 sec  hold - Standing Side Plank on Wall  - 1 x daily - 7 x weekly - 3 sets - 30 second  hold - Side Stepping with Resistance at Thighs and Counter Support  - 1 x daily - 7 x weekly - 2-3 sets - 10 reps   GOALS: Goals reviewed with patient? Yes  SHORT TERM GOALS: Target date: 01/26/2024   Patient will be independent in home exercise program to improve strength/mobility for better functional independence with ADLs.  Baseline: 12/15/2023 initial HEP provided Goal status: INITIAL   LONG TERM GOALS: Target date: 03/08/2024  Patient will increase Berg Balance score to > 51/56 to demonstrate improved balance and decreased fall risk during functional activities and ADLs.  Baseline: need to assess Goal status: INITIAL  2.  Patient will increase six minute walk test distance to >1043ft for progression to community ambulator and improve gait ability  Baseline: 1080 ft  Goal status: INITIAL  3.  Patient will increase Functional Gait Assessment (FGA) score to >20/30 as to reduce fall risk and improve dynamic gait safety with community ambulation.  Baseline: 23 Goal status: MET  4.  Patient will maintain 30 seconds of single leg stance on each LE without loss of balance indicating increased hip strength and improved ability to ambulate and navigate stairs without trendelenburg pattern. Baseline: L LE: 5 seconds, R LE: 0 seconds Goal status: INITIAL  5.  Patient will increase BLE gross strength to 4+/5 as to improve functional strength for independent gait, increased standing tolerance and increased ADL ability.  Baseline: see above Goal status:  INITIAL   ASSESSMENT:  CLINICAL IMPRESSION: Continuing PT POC working on R hip strength. Modified session today due to some lingering soreness and reports of knee pain with some interventions last session. Continuing with heavy focus on hip abductors and external rotators due to significant weakness/trendelenburg. Pt remains with weak hip abduction in [place] and hip drop with gait indicative of continued muscular weakness in this muscle group affecting gait/mobility and falls risk. Continuing to educate on DOMS and importance for rest days for muscular recovery. Pt will continue to benefit from skilled physical therapy intervention to address impairments, improve QOL, and attain therapy goals.   OBJECTIVE IMPAIRMENTS: Abnormal gait, decreased activity tolerance, decreased balance, decreased endurance, decreased knowledge of condition, decreased mobility, difficulty walking, decreased strength, improper body mechanics, and pain.   ACTIVITY LIMITATIONS: carrying, lifting, bending, standing, squatting, stairs, transfers, dressing, locomotion level, and caring for others  PARTICIPATION LIMITATIONS: cleaning, laundry, shopping, and community activity  PERSONAL FACTORS: Age, Sex, Time since onset of injury/illness/exacerbation, and 3+ comorbidities: Rheumatoid arthritis, obesity,  prediabetes, HTN are also affecting patient's functional outcome.   REHAB POTENTIAL: Good  CLINICAL DECISION MAKING: Stable/uncomplicated  EVALUATION COMPLEXITY: Low  PLAN:  PT FREQUENCY: 1-2x/week  PT DURATION: 12 weeks  PLANNED INTERVENTIONS: [id number]- PT Re-evaluation, 97750- Physical Performance Testing, 97110-Therapeutic exercises, 97530- Therapeutic activity, [id number]- Neuromuscular re-education, 97535- Self Care, [id number]- Manual therapy, [id number]- Gait training, [phone]- Orthotic Initial, [phone]- Orthotic/Prosthetic subsequent, [phone]- Canalith repositioning, [phone]- Electrical stimulation (manual), Patient/Family education,  Balance training, Stair training, Taping, Dry Needling, Joint mobilization, Joint manipulation, Spinal mobilization, Vestibular training, DME instructions, Cryotherapy, Moist heat, and Biofeedback  PLAN FOR NEXT SESSION:  - Need to assess spine curvature in more detail - specifically measure leg length - discuss possible orthotic if needed  - FGA - focus on hip strengthening, specifically R hip abductors to decrease trendelenburg during gait   Marc Senior. Fairly IV, PT, DPT Physical Therapist- [place]  [facility] 2:09 PM 01/26/24

## 2024-01-31 ENCOUNTER — Ambulatory Visit: Payer: PRIVATE HEALTH INSURANCE | Admitting: Physical Therapy

## 2024-01-31 DIAGNOSIS — R2681 Unsteadiness on feet: Secondary | ICD-10-CM

## 2024-01-31 DIAGNOSIS — R269 Unspecified abnormalities of gait and mobility: Secondary | ICD-10-CM

## 2024-01-31 DIAGNOSIS — R531 Weakness: Secondary | ICD-10-CM | POA: Diagnosis not present

## 2024-01-31 NOTE — Therapy (Signed)
 OUTPATIENT PHYSICAL THERAPY NEURO TREATMENT/ Physical Therapy Progress Note/ RECERT   Dates of reporting period  12/15/23   to   01/31/24    Patient Name: Rita Bailey MRN: [id number] DOB:[birth year], 73 y.o., female Today's Date: 01/31/2024   PCP: Mazie Speed, MD REFERRING PROVIDER: Mazie Speed, MD   END OF SESSION:   PT End of Session - 01/31/24 1322     Visit Number 10    Number of Visits 24    Date for PT Re-Evaluation 03/27/24    PT Start Time 1321    PT Stop Time 1404    PT Time Calculation (min) 43 min    Equipment Utilized During Treatment Gait belt    Activity Tolerance Patient tolerated treatment well    Behavior During Therapy WFL for tasks assessed/performed             Past Medical History:  Diagnosis Date   Allergy    Arthritis    Hypertension    Scoliosis of lumbosacral spine    Past Surgical History:  Procedure Laterality Date   CESAREAN SECTION     COLONOSCOPY WITH PROPOFOL  N/A 05/11/2017   Procedure: COLONOSCOPY WITH PROPOFOL ;  Surgeon: Marshall Skeeter, MD;  Location: [facility];  Service: Endoscopy;  Laterality: N/A;   COLONOSCOPY WITH PROPOFOL  N/A 12/16/2021   Procedure: COLONOSCOPY WITH PROPOFOL ;  Surgeon: Marshall Skeeter, MD;  Location: [facility];  Service: Endoscopy;  Laterality: N/A;   DILATATION & CURETTAGE/HYSTEROSCOPY WITH MYOSURE N/A 02/27/2016   Procedure: DILATATION & CURETTAGE/HYSTEROSCOPY WITH MYOSURE;  Surgeon: Carolynn Citrin, MD;  Location: [facility];  Service: Gynecology;  Laterality: N/A;   ESOPHAGOGASTRODUODENOSCOPY (EGD) WITH PROPOFOL  N/A 05/11/2017   Procedure: ESOPHAGOGASTRODUODENOSCOPY (EGD) WITH PROPOFOL ;  Surgeon: Marshall Skeeter, MD;  Location: [facility];  Service: Endoscopy;  Laterality: N/A;   sebaceous syst removed  08/24/2011   tonsillectomy     TONSILLECTOMY     Patient Active Problem List   Diagnosis Date Noted   Rheumatoid arthritis (HCC) 05/30/2023   Bilateral hand  swelling 12/28/2022   Eczema 04/13/2018   Irritable bowel syndrome with diarrhea 04/13/2018   Prediabetes 04/13/2018   PMB (postmenopausal bleeding) 10/05/2017   Gastro-esophageal reflux disease with esophagitis 05/13/2017   Strain of muscle of right hip 05/06/2017   Bilateral hearing loss 04/12/2017   Scoliosis of lumbosacral spine 03/30/2017   Abnormality of gait and mobility 03/30/2017   Allergic rhinitis 09/08/2015   Hypertension 09/08/2015   Gonalgia 09/08/2015   RAD (reactive airway disease) 09/08/2015   Obesity 03/12/2015   Current tear of meniscus 01/02/2015   Tear of meniscus of knee 01/02/2015    ONSET DATE: Years ago  REFERRING DIAG: R26.9 (ICD-10-CM) - Abnormal gait   THERAPY DIAG:  Generalized weakness - Plan: PT plan of care cert/re-cert  Unsteadiness on feet - Plan: PT plan of care cert/re-cert  Abnormality of gait - Plan: PT plan of care cert/re-cert  Rationale for Evaluation and Treatment: Rehabilitation  SUBJECTIVE:  SUBJECTIVE STATEMENT: Pt reports some soreness after last session. Still present some but mild. No pain today. Has been busy outside.  From eval:  Patient reports she had previously went to [facility] to see Ortho and was referred for physical therapy, but due to logistical errors she did not start PT at that time.  Pt states these gait deviations started years ago. Reports no injury at the time they started. Pt states she has always walked a lot for exercise, but it is hard for her to do now. Pt states she has noticed her gait deviations because she has started getting L thigh pain, but otherwise does not have pain. Pt states these deviations impact her balance.  States she has difficulty walking slow because her gait deviations are more pronounced.   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 mother, and immobile husband due to him needing a hip replacement.  Pt accompanied by: self  PERTINENT HISTORY: PMH: Rheumatoid arthritis, obesity, prediabetes, HTN  Per MD note on 12/13/2023: Trendelenburg gait due to hip weakness Trendelenburg gait due to hip weakness identified. Previous recommendation for physical therapy was not followed through due to logistical issues. - Refer to physical therapy for hip strengthening exercises  PAIN:  Are you having pain? None related to hip weakness, but will have pain in hands when overusing them due to RA  PRECAUTIONS: None  RED FLAGS: None   WEIGHT BEARING RESTRICTIONS: No  FALLS: Has patient fallen in last 6 months? Yes. Number of falls 1x tripped backwards over her cat  LIVING ENVIRONMENT: Lives with: lives with their spouse and lives with their son (husband and special needs son) Lives in: House/apartment - 1 level Stairs: Yes: External: 1 steps; on left going up Has following equipment at home: shower chair and Grab bars  PLOF: Independent, Independent with household mobility without device, and Independent with homemaking with ambulation  PATIENT GOALS: be able to walk with a normal gait  OBJECTIVE:  Note: Objective measures were completed at Evaluation unless otherwise noted.  DIAGNOSTIC FINDINGS: N/A  COGNITION: Overall cognitive status: Within functional limits for tasks assessed   SENSATION: WFL  COORDINATION: Not formally assessed  EDEMA:  Not formally assessed, but none observed  MUSCLE TONE: appears WNL  MUSCLE LENGTH: Not formally assessed During supine testing, it is possible L LE is slightly longer than R LE, but didn't formally measure  DTRs:  Not formally assessed  POSTURE: rounded shoulders and forward head Need to assess spine curvature in more detail  LOWER EXTREMITY ROM:     Active   WFL/WNL  throughout Right Eval Left Eval  Hip flexion    Hip extension    Hip abduction    Hip adduction    Hip internal rotation    Hip external rotation    Knee flexion    Knee extension    Ankle dorsiflexion    Ankle plantarflexion    Ankle inversion    Ankle eversion     (Blank rows = not tested)  LOWER EXTREMITY MMT:    MMT Right Eval Left Eval Right 6/17 Left 6/17  Hip flexion 4- 4+ 4   Hip extension      Hip abduction 2+  Tested in sidelying 4- Tested in sidelying 3- 4   Hip adduction      Hip internal rotation      Hip external rotation 3+ 4 4  Knee flexion 5 5    Knee extension 5 5    Ankle dorsiflexion 5 5    Ankle plantarflexion 5 5    Ankle inversion      Ankle eversion      (Blank rows = not tested)  Manual Muscle Test Scale 0/5 = No muscle contraction can be seen or felt 1/5 = Contraction can be felt, but there is no motion 2-/5 = Part moves through incomplete ROM w/ gravity decreased 2/5 = Part moves through complete ROM w/ gravity decreased 2+/5 = Part moves through incomplete ROM (<50%) against gravity or through complete ROM w/ gravity 3-/5 = Part moves through incomplete ROM (>50%) against gravity 3/5 = Part moves through complete ROM against gravity 3+/5 = Part moves through complete ROM against gravity/slight resistance 4-/5= Holds test position against slight to moderate pressure 4/5 = Part moves through complete ROM against gravity/moderate resistance 4+/5= Holds test position against moderate to strong pressure 5/5 = Part moves through complete ROM against gravity/full resistance  BED MOBILITY:  Findings: Sit to supine Modified independence Supine to sit Modified independence  TRANSFERS: Sit to stand: Complete Independence  Assistive device utilized: None     Stand to sit: Complete Independence  Assistive device utilized: None     Chair to chair: Complete Independence  Assistive device utilized: None       RAMP:  Not tested  CURB:  Not  tested  STAIRS: Findings: Level of Assistance: Modified independence, Stair Negotiation Technique: Alternating Pattern  Forwards with Single Rail on Right Single Rail on Left Bilateral Rails, Number of Stairs: 4, Height of Stairs: 6in   , and Comments: pt varying in railings used, but reciprocal pattern throughout GAIT: Findings: Gait Characteristics: trendelenburg and lateral lean- Left, Distance walked: 157ft, Assistive device utilized:None, Level of assistance: Complete Independence, and Comments: L lateral trunk lean during every R stance phase due to R hip weakness and possible leg length discrepancy     FUNCTIONAL TESTS:  5 times sit to stand: 12.97 seconds 6 minute walk test: need to assess 10 meter walk test: 1.51m/s without AD Berg Balance Scale: need to assess Functional gait assessment: need to assess  PATIENT SURVEYS:  ABC scale 94.375% with pt reporting least confidence standing on chair to reach for something, stepping on/off escalator without holding on, and walking on ice - otherwise pt reporting 95-100% on other items                                                                                                                               TREATMENT DATE: 01/31/2024 Physical performance testing   SLS test: pt able to hold on R LE for 3 sec and LLE for 17 sec. Both improved from initial testing but still not at adequate level for safe and normal gait.   MMT: see chart in objective section  ABC scale: 93%  LEFS: 50/80   There.Ex:  Hip abduction on the R in SL 2 x 10 reps with assistance for form and to allow for full eccentric motion   Staggered STS with R LE focus  X 8, x 10 seated rest between.   Standing sidestepping in // bars x 3 laps with YTB around knees and mini squat on each rep   SELF care: Discussed plan of care and patient's current progress based on testing in today's session.  Also discussed ways to be progressing therapy in the future and  discussed activities that are challenging for patient that we can incorporate into interventions as we progress. Pt educated throughout session about proper posture and technique with exercises. Improved exercise technique, movement at target joints, use of target muscles after min to mod verbal, visual, tactile cues.   PATIENT EDUCATION: Education details: Pt educated throughout session about proper posture and technique with exercises. Improved exercise technique, movement at target joints, use of target muscles after min to mod verbal, visual, tactile cues. Person educated: Patient Education method: Explanation and Handouts Education comprehension: verbalized understanding and needs further education  HOME EXERCISE PROGRAM:  Access Code: [email or web address] URL: [email or web address] Date: 01/02/2024 Prepared by: Carlen Chasten  Exercises - Clamshell with Resistance  - 1 x daily - 7 x weekly - 2-3 sets - 10-15 reps - Supine Bridge with Resistance Band  - 1 x daily - 7 x weekly - 2 sets - 15 reps - Marching Bridge  - 1 x daily - 7 x weekly - 2 sets - 10 reps - Supine Hip Adduction Isometric with Ball  - 1 x daily - 7 x weekly - 2 sets - 10 reps - 3-5 sec  hold - Standing Side Plank on Wall  - 1 x daily - 7 x weekly - 3 sets - 30 second  hold - Side Stepping with Resistance at Thighs and Counter Support  - 1 x daily - 7 x weekly - 2-3 sets - 10 reps   GOALS: Goals reviewed with patient? Yes  SHORT TERM GOALS: Target date: 01/26/2024   Patient will be independent in home exercise program to improve strength/mobility for better functional independence with ADLs.  Baseline: 12/15/2023 initial HEP provided 6/17: pt completing regularly Goal status: MET   LONG TERM GOALS: Target date: 03/08/2024  Patient will increase Berg Balance score to > 51/56 to demonstrate improved balance and decreased fall risk during functional activities and ADLs.  Baseline: 55 Goal status: MET  2.   Patient will increase six minute walk test distance to >1082ft for progression to community ambulator and improve gait ability  Baseline: 1080 ft  Goal status: MET  3.  Patient will increase Functional Gait Assessment (FGA) score to >20/30 as to reduce fall risk and improve dynamic gait safety with community ambulation.  Baseline: 23 Goal status: MET  4.  Patient will maintain 30 seconds of single leg stance on each LE without loss of balance indicating increased hip strength and improved ability to ambulate and navigate stairs without trendelenburg pattern. Baseline: L LE: 5 seconds, R LE: 0 seconds 6/17: 17 sec, 3-4 sec Goal status: ONGOING  5.  Patient will increase BLE gross strength to 4+/5 as to improve functional strength for independent gait, increased standing tolerance and increased ADL ability.  Baseline: see above Goal status: ONGOING  7.  Patient will improve lower extremity functional scale by 10 points or greater in order to indicate improved function as it relates to her lower extremity weakness. Baseline: 50  Goal status: INITIAL  8.  Patient will report and demonstrate little to no difficulty when ascending 10 stairs or more in order to improve her safety and confidence with community ambulation and accessing her environment. Baseline: moderate difficulty Goal status: INITIAL     ASSESSMENT:  CLINICAL IMPRESSION: Pt presents for progress note this date. Pt shows great progress towards general balance related goals and outcome measures, however, pt still having gross abnormality with her gait.  Patient completes lower extremity functional scale and scores a 50 out of 80 demonstrating significant impairment as a result of her right lower extremity weakness.  Patient single-leg stance time on the right side is improving but is still not at adequate or safe levels for prolonged single-leg stance time during gait activities.  During gait patient still showing scissoring  style of gait particularly with gait at slower speeds where she has to rely on right lower extremity single-leg stance for longer amounts of time.  Adding goals specifically related to right lower extremity stability and lower extremity functional scale this date to continue to track patient's progress she continues with therapy. Patient's condition has the potential to improve in response to therapy. Maximum improvement is yet to be obtained. The anticipated improvement is attainable and reasonable in a generally predictable time.  Pt will continue to benefit from skilled physical therapy intervention to address impairments, improve QOL, and attain therapy goals.    OBJECTIVE IMPAIRMENTS: Abnormal gait, decreased activity tolerance, decreased balance, decreased endurance, decreased knowledge of condition, decreased mobility, difficulty walking, decreased strength, improper body mechanics, and pain.   ACTIVITY LIMITATIONS: carrying, lifting, bending, standing, squatting, stairs, transfers, dressing, locomotion level, and caring for others  PARTICIPATION LIMITATIONS: cleaning, laundry, shopping, and community activity  PERSONAL FACTORS: Age, Sex, Time since onset of injury/illness/exacerbation, and 3+ comorbidities: Rheumatoid arthritis, obesity, prediabetes, HTN are also affecting patient's functional outcome.   REHAB POTENTIAL: Good  CLINICAL DECISION MAKING: Stable/uncomplicated  EVALUATION COMPLEXITY: Low  PLAN:  PT FREQUENCY: 1-2x/week  PT DURATION: 12 weeks  PLANNED INTERVENTIONS: [id number]- PT Re-evaluation, 97750- Physical Performance Testing, 97110-Therapeutic exercises, 97530- Therapeutic activity, [id number]- Neuromuscular re-education, 97535- Self Care, [id number]- Manual therapy, [id number]- Gait training, [phone]- Orthotic Initial, [phone]- Orthotic/Prosthetic subsequent, [phone]- Canalith repositioning, [phone]- Electrical stimulation (manual), Patient/Family education, Balance training, Stair training,  Taping, Dry Needling, Joint mobilization, Joint manipulation, Spinal mobilization, Vestibular training, DME instructions, Cryotherapy, Moist heat, and Biofeedback  PLAN FOR NEXT SESSION:  - Need to assess spine curvature in more detail - focus on hip strengthening, specifically R hip abductors to decrease trendelenburg during gait -progress with static balance, particularly on R LE    Note: Portions of this document were prepared using Dragon voice recognition software and although reviewed may contain unintentional dictation errors in syntax, grammar, or spelling.  Edwina Gram PT ,DPT Physical Therapist- [place]  [facility]   2:31 PM 01/31/24

## 2024-01-31 NOTE — Therapy (Signed)
 OUTPATIENT PHYSICAL THERAPY NEURO PROGRESS NOTE   Patient Name: Rita Bailey MRN: [id number] DOB:[birth year], 73 y.o., female Today's Date: 01/31/2024   PCP: Mazie Speed, MD REFERRING PROVIDER: Mazie Speed, MD   END OF SESSION:   PT End of Session - 01/31/24 1322     Visit Number 10    Number of Visits 24    Date for PT Re-Evaluation 03/08/24    PT Start Time 1321    Equipment Utilized During Treatment Gait belt    Activity Tolerance Patient tolerated treatment well    Behavior During Therapy [facility] for tasks assessed/performed             Past Medical History:  Diagnosis Date   Allergy    Arthritis    Hypertension    Scoliosis of lumbosacral spine    Past Surgical History:  Procedure Laterality Date   CESAREAN SECTION     COLONOSCOPY WITH PROPOFOL  N/A 05/11/2017   Procedure: COLONOSCOPY WITH PROPOFOL ;  Surgeon: Marshall Skeeter, MD;  Location: [facility] ENDOSCOPY;  Service: Endoscopy;  Laterality: N/A;   COLONOSCOPY WITH PROPOFOL  N/A 12/16/2021   Procedure: COLONOSCOPY WITH PROPOFOL ;  Surgeon: Marshall Skeeter, MD;  Location: [facility] ENDOSCOPY;  Service: Endoscopy;  Laterality: N/A;   DILATATION & CURETTAGE/HYSTEROSCOPY WITH MYOSURE N/A 02/27/2016   Procedure: DILATATION & CURETTAGE/HYSTEROSCOPY WITH MYOSURE;  Surgeon: Carolynn Citrin, MD;  Location: [facility] ORS;  Service: Gynecology;  Laterality: N/A;   ESOPHAGOGASTRODUODENOSCOPY (EGD) WITH PROPOFOL  N/A 05/11/2017   Procedure: ESOPHAGOGASTRODUODENOSCOPY (EGD) WITH PROPOFOL ;  Surgeon: Marshall Skeeter, MD;  Location: [facility] ENDOSCOPY;  Service: Endoscopy;  Laterality: N/A;   sebaceous syst removed  08/24/2011   tonsillectomy     TONSILLECTOMY     Patient Active Problem List   Diagnosis Date Noted   Rheumatoid arthritis (HCC) 05/30/2023   Bilateral hand swelling 12/28/2022   Eczema 04/13/2018   Irritable bowel syndrome with diarrhea 04/13/2018   Prediabetes 04/13/2018   PMB (postmenopausal  bleeding) 10/05/2017   Gastro-esophageal reflux disease with esophagitis 05/13/2017   Strain of muscle of right hip 05/06/2017   Bilateral hearing loss 04/12/2017   Scoliosis of lumbosacral spine 03/30/2017   Abnormality of gait and mobility 03/30/2017   Allergic rhinitis 09/08/2015   Hypertension 09/08/2015   Gonalgia 09/08/2015   RAD (reactive airway disease) 09/08/2015   Obesity 03/12/2015   Current tear of meniscus 01/02/2015   Tear of meniscus of knee 01/02/2015    ONSET DATE: Years ago  REFERRING DIAG: R26.9 (ICD-10-CM) - Abnormal gait   THERAPY DIAG:  No diagnosis found.  Rationale for Evaluation and Treatment: Rehabilitation  SUBJECTIVE:  SUBJECTIVE STATEMENT: Pt reports some soreness after last session. Still present some but mild. No pain today and reports she is doing well.    From eval:  Patient reports she had previously went to [facility] to see Ortho and was referred for physical therapy, but due to logistical errors she did not start PT at that time.  Pt states these gait deviations started years ago. Reports no injury at the time they started. Pt states she has always walked a lot for exercise, but it is hard for her to do now. Pt states she has noticed her gait deviations because she has started getting L thigh pain, but otherwise does not have pain. Pt states these deviations impact her balance.  States she has difficulty walking slow because her gait deviations are more pronounced.   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 mother, and immobile husband due to him needing a hip replacement.  Pt accompanied by: self  PERTINENT HISTORY: PMH: Rheumatoid arthritis, obesity, prediabetes, HTN  Per MD note on 12/13/2023:  Trendelenburg gait due to hip weakness Trendelenburg gait due to hip weakness identified. Previous recommendation for physical therapy was not followed through due to logistical issues. - Refer to physical therapy for hip strengthening exercises  PAIN:  Are you having pain? None related to hip weakness, but will have pain in hands when overusing them due to RA  PRECAUTIONS: None  RED FLAGS: None   WEIGHT BEARING RESTRICTIONS: No  FALLS: Has patient fallen in last 6 months? Yes. Number of falls 1x tripped backwards over her cat  LIVING ENVIRONMENT: Lives with: lives with their spouse and lives with their son (husband and special needs son) Lives in: House/apartment - 1 level Stairs: Yes: External: 1 steps; on left going up Has following equipment at home: shower chair and Grab bars  PLOF: Independent, Independent with household mobility without device, and Independent with homemaking with ambulation  PATIENT GOALS: be able to walk with a normal gait  OBJECTIVE:  Note: Objective measures were completed at Evaluation unless otherwise noted.  DIAGNOSTIC FINDINGS: N/A  COGNITION: Overall cognitive status: Within functional limits for tasks assessed   SENSATION: WFL  COORDINATION: Not formally assessed  EDEMA:  Not formally assessed, but none observed  MUSCLE TONE: appears WNL  MUSCLE LENGTH: Not formally assessed During supine testing, it is possible L LE is slightly longer than R LE, but didn't formally measure  DTRs:  Not formally assessed  POSTURE: rounded shoulders and forward head Need to assess spine curvature in more detail  LOWER EXTREMITY ROM:     Active   WFL/WNL throughout Right Eval Left Eval  Hip flexion    Hip extension    Hip abduction    Hip adduction    Hip internal rotation    Hip external rotation    Knee flexion    Knee extension    Ankle dorsiflexion    Ankle plantarflexion    Ankle inversion    Ankle eversion     (Blank rows  = not tested)  LOWER EXTREMITY MMT:    MMT Right Eval Left Eval Right Prog. 01/31/2024 Left Prog  Hip flexion 4- 4+ 4 4+  Hip extension      Hip abduction 2+  Tested in sidelying 4- Tested in sidelying 4- 4   Hip adduction      Hip internal rotation      Hip external rotation  3+ 4 4 4-  Knee flexion 5 5    Knee extension 5 5    Ankle dorsiflexion 5 5    Ankle plantarflexion 5 5    Ankle inversion      Ankle eversion      (Blank rows = not tested)  Manual Muscle Test Scale 0/5 = No muscle contraction can be seen or felt 1/5 = Contraction can be felt, but there is no motion 2-/5 = Part moves through incomplete ROM w/ gravity decreased 2/5 = Part moves through complete ROM w/ gravity decreased 2+/5 = Part moves through incomplete ROM (<50%) against gravity or through complete ROM w/ gravity 3-/5 = Part moves through incomplete ROM (>50%) against gravity 3/5 = Part moves through complete ROM against gravity 3+/5 = Part moves through complete ROM against gravity/slight resistance 4-/5= Holds test position against slight to moderate pressure 4/5 = Part moves through complete ROM against gravity/moderate resistance 4+/5= Holds test position against moderate to strong pressure 5/5 = Part moves through complete ROM against gravity/full resistance  BED MOBILITY:  Findings: Sit to supine Modified independence Supine to sit Modified independence  TRANSFERS: Sit to stand: Complete Independence  Assistive device utilized: None     Stand to sit: Complete Independence  Assistive device utilized: None     Chair to chair: Complete Independence  Assistive device utilized: None       RAMP:  Not tested  CURB:  Not tested  STAIRS: Findings: Level of Assistance: Modified independence, Stair Negotiation Technique: Alternating Pattern  Forwards with Single Rail on Right Single Rail on Left Bilateral Rails, Number of Stairs: 4, Height of Stairs: 6in   , and Comments: pt varying in  railings used, but reciprocal pattern throughout GAIT: Findings: Gait Characteristics: trendelenburg and lateral lean- Left, Distance walked: 181ft, Assistive device utilized:None, Level of assistance: Complete Independence, and Comments: L lateral trunk lean during every R stance phase due to R hip weakness and possible leg length discrepancy     FUNCTIONAL TESTS:  5 times sit to stand: 12.97 seconds 6 minute walk test: need to assess 10 meter walk test: 1.75m/s without AD Berg Balance Scale: need to assess Functional gait assessment: need to assess  PATIENT SURVEYS:  ABC scale 94.375% with pt reporting least confidence standing on chair to reach for something, stepping on/off escalator without holding on, and walking on ice - otherwise pt reporting 95-100% on other items                                                                                                                               TREATMENT DATE: 01/31/2024  Physical Performance   ABC  LEFS  MMT  B SLS    There.Act:    Squat with GTB at distal femurs: 2x10   Side lying hip abduction: 3x8 RLE needing minA for full range.   STS with RLE under BOS: 3x10, SBA  PATIENT EDUCATION: Education details: Pt educated throughout session about proper posture and technique with exercises. Improved exercise technique, movement at target joints, use of target muscles after min to mod verbal, visual, tactile cues. Person educated: Patient Education method: Explanation and Handouts Education comprehension: verbalized understanding and needs further education  HOME EXERCISE PROGRAM:  Access Code: [email or web address] URL: [email or web address] Date: 01/02/2024 Prepared by: Carlen Chasten  Exercises - Clamshell with Resistance  - 1 x daily - 7 x weekly - 2-3 sets - 10-15 reps - Supine Bridge with Resistance Band  - 1 x daily - 7 x weekly - 2 sets - 15 reps - Marching Bridge  - 1 x daily - 7 x weekly - 2 sets -  10 reps - Supine Hip Adduction Isometric with Ball  - 1 x daily - 7 x weekly - 2 sets - 10 reps - 3-5 sec  hold - Standing Side Plank on Wall  - 1 x daily - 7 x weekly - 3 sets - 30 second  hold - Side Stepping with Resistance at Thighs and Counter Support  - 1 x daily - 7 x weekly - 2-3 sets - 10 reps   GOALS: Goals reviewed with patient? Yes  SHORT TERM GOALS: Target date: 01/26/2024   Patient will be independent in home exercise program to improve strength/mobility for better functional independence with ADLs.  Baseline: 12/15/2023 initial HEP provided Goal status: INITIAL   LONG TERM GOALS: Target date: 03/08/2024  Patient will increase Berg Balance score to > 51/56 to demonstrate improved balance and decreased fall risk during functional activities and ADLs.  Baseline: need to assess Goal status: INITIAL  2.  Patient will increase six minute walk test distance to >1020ft for progression to community ambulator and improve gait ability  Baseline: 1080 ft  Goal status: INITIAL  3.  Patient will increase Functional Gait Assessment (FGA) score to >20/30 as to reduce fall risk and improve dynamic gait safety with community ambulation.  Baseline: 23 Goal status: MET  4.  Patient will maintain 30 seconds of single leg stance on each LE without loss of balance indicating increased hip strength and improved ability to ambulate and navigate stairs without trendelenburg pattern. Baseline: L LE: 5 seconds, R LE: 0 seconds 6/17 - 16.9 L, 3.5 R  Goal status: INITIAL  5.  Patient will increase BLE gross strength to 4+/5 as to improve functional strength for independent gait, increased standing tolerance and increased ADL ability.  Baseline: see above Goal status: INITIAL   ASSESSMENT:  CLINICAL IMPRESSION:   Continuing PT POC working on R hip strength. Modified session today due to some lingering soreness and reports of knee pain with some interventions last session. Continuing with  heavy focus on hip abductors and external rotators due to significant weakness/trendelenburg. Pt remains with weak hip abduction in OKC and hip drop with gait indicative of continued muscular weakness in this muscle group affecting gait/mobility and falls risk. Continuing to educate on DOMS and importance for rest days for muscular recovery. Pt will continue to benefit from skilled physical therapy intervention to address impairments, improve QOL, and attain therapy goals.   OBJECTIVE IMPAIRMENTS: Abnormal gait, decreased activity tolerance, decreased balance, decreased endurance, decreased knowledge of condition, decreased mobility, difficulty walking, decreased strength, improper body mechanics, and pain.   ACTIVITY LIMITATIONS: carrying, lifting, bending, standing, squatting, stairs, transfers, dressing, locomotion level, and caring for others  PARTICIPATION LIMITATIONS: cleaning, laundry, shopping, and community activity  PERSONAL FACTORS: Age, Sex,  Time since onset of injury/illness/exacerbation, and 3+ comorbidities: Rheumatoid arthritis, obesity, prediabetes, HTN are also affecting patient's functional outcome.   REHAB POTENTIAL: Good  CLINICAL DECISION MAKING: Stable/uncomplicated  EVALUATION COMPLEXITY: Low  PLAN:  PT FREQUENCY: 1-2x/week  PT DURATION: 12 weeks  PLANNED INTERVENTIONS: 97164- PT Re-evaluation, 97750- Physical Performance Testing, 97110-Therapeutic exercises, 97530- Therapeutic activity, [id number]- Neuromuscular re-education, 97535- Self Care, [id number]- Manual therapy, [id number]- Gait training, [phone]- Orthotic Initial, [phone]- Orthotic/Prosthetic subsequent, [phone]- Canalith repositioning, [phone]- Electrical stimulation (manual), Patient/Family education, Balance training, Stair training, Taping, Dry Needling, Joint mobilization, Joint manipulation, Spinal mobilization, Vestibular training, DME instructions, Cryotherapy, Moist heat, and Biofeedback  PLAN FOR NEXT SESSION:  - Need  to assess spine curvature in more detail - specifically measure leg length - discuss possible orthotic if needed  - FGA - focus on hip strengthening, specifically R hip abductors to decrease trendelenburg during gait   Marc Senior. Fairly IV, PT, DPT Physical Therapist- [place]  [facility] 1:23 PM 01/31/24

## 2024-02-02 ENCOUNTER — Ambulatory Visit: Payer: PRIVATE HEALTH INSURANCE | Admitting: Physical Therapy

## 2024-02-07 ENCOUNTER — Ambulatory Visit: Payer: PRIVATE HEALTH INSURANCE | Admitting: Physical Therapy

## 2024-02-07 DIAGNOSIS — R531 Weakness: Secondary | ICD-10-CM | POA: Diagnosis not present

## 2024-02-07 DIAGNOSIS — R2681 Unsteadiness on feet: Secondary | ICD-10-CM

## 2024-02-07 DIAGNOSIS — R269 Unspecified abnormalities of gait and mobility: Secondary | ICD-10-CM

## 2024-02-07 NOTE — Therapy (Unsigned)
 OUTPATIENT PHYSICAL THERAPY NEURO TREATMENT   Patient Name: Rita Bailey MRN: [id number] DOB:1950-11-27, 73 y.o., female Today's Date: 02/07/2024   PCP: Myrla Jon HERO, MD REFERRING PROVIDER: Myrla Jon HERO, MD   END OF SESSION:   PT End of Session - 02/07/24 1321     Visit Number 11    Number of Visits 24    Date for PT Re-Evaluation 03/27/24    PT Start Time 1318    PT Stop Time 1402    PT Time Calculation (min) 44 min    Equipment Utilized During Treatment Gait belt    Activity Tolerance Patient tolerated treatment well    Behavior During Therapy WFL for tasks assessed/performed              Past Medical History:  Diagnosis Date   Allergy    Arthritis    Hypertension    Scoliosis of lumbosacral spine    Past Surgical History:  Procedure Laterality Date   CESAREAN SECTION     COLONOSCOPY WITH PROPOFOL  N/A 05/11/2017   Procedure: COLONOSCOPY WITH PROPOFOL ;  Surgeon: Dessa Reyes ORN, MD;  Location: [facility];  Service: Endoscopy;  Laterality: N/A;   COLONOSCOPY WITH PROPOFOL  N/A 12/16/2021   Procedure: COLONOSCOPY WITH PROPOFOL ;  Surgeon: Dessa Reyes ORN, MD;  Location: [facility];  Service: Endoscopy;  Laterality: N/A;   DILATATION & CURETTAGE/HYSTEROSCOPY WITH MYOSURE N/A 02/27/2016   Procedure: DILATATION & CURETTAGE/HYSTEROSCOPY WITH MYOSURE;  Surgeon: Debby JINNY Dinsmore, MD;  Location: [facility];  Service: Gynecology;  Laterality: N/A;   ESOPHAGOGASTRODUODENOSCOPY (EGD) WITH PROPOFOL  N/A 05/11/2017   Procedure: ESOPHAGOGASTRODUODENOSCOPY (EGD) WITH PROPOFOL ;  Surgeon: Dessa Reyes ORN, MD;  Location: [facility];  Service: Endoscopy;  Laterality: N/A;   sebaceous syst removed  08/24/2011   tonsillectomy     TONSILLECTOMY     Patient Active Problem List   Diagnosis Date Noted   Rheumatoid arthritis (HCC) 05/30/2023   Bilateral hand swelling 12/28/2022   Eczema 04/13/2018   Irritable bowel syndrome with diarrhea 04/13/2018    Prediabetes 04/13/2018   PMB (postmenopausal bleeding) 10/05/2017   Gastro-esophageal reflux disease with esophagitis 05/13/2017   Strain of muscle of right hip 05/06/2017   Bilateral hearing loss 04/12/2017   Scoliosis of lumbosacral spine 03/30/2017   Abnormality of gait and mobility 03/30/2017   Allergic rhinitis 09/08/2015   Hypertension 09/08/2015   Gonalgia 09/08/2015   RAD (reactive airway disease) 09/08/2015   Obesity 03/12/2015   Current tear of meniscus 01/02/2015   Tear of meniscus of knee 01/02/2015    ONSET DATE: Years ago  REFERRING DIAG: R26.9 (ICD-10-CM) - Abnormal gait   THERAPY DIAG:  Generalized weakness  Unsteadiness on feet  Abnormality of gait  Rationale for Evaluation and Treatment: Rehabilitation  SUBJECTIVE:  SUBJECTIVE STATEMENT:  Pt reports some popping in the hip that felt painful with clamshells so she stopped this exercise.   From eval:  Patient reports she had previously went to [facility] to see Ortho and was referred for physical therapy, but due to logistical errors she did not start PT at that time.  Pt states these gait deviations started years ago. Reports no injury at the time they started. Pt states she has always walked a lot for exercise, but it is hard for her to do now. Pt states she has noticed her gait deviations because she has started getting L thigh pain, but otherwise does not have pain. Pt states these deviations impact her balance.  States she has difficulty walking slow because her gait deviations are more pronounced.   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 mother, and immobile husband due to him needing a hip replacement.  Pt accompanied by: self  PERTINENT HISTORY: PMH:  Rheumatoid arthritis, obesity, prediabetes, HTN  Per MD note on 12/13/2023: Trendelenburg gait due to hip weakness Trendelenburg gait due to hip weakness identified. Previous recommendation for physical therapy was not followed through due to logistical issues. - Refer to physical therapy for hip strengthening exercises  PAIN:  Are you having pain? None related to hip weakness, but will have pain in hands when overusing them due to RA  PRECAUTIONS: None  RED FLAGS: None   WEIGHT BEARING RESTRICTIONS: No  FALLS: Has patient fallen in last 6 months? Yes. Number of falls 1x tripped backwards over her cat  LIVING ENVIRONMENT: Lives with: lives with their spouse and lives with their son (husband and special needs son) Lives in: House/apartment - 1 level Stairs: Yes: External: 1 steps; on left going up Has following equipment at home: shower chair and Grab bars  PLOF: Independent, Independent with household mobility without device, and Independent with homemaking with ambulation  PATIENT GOALS: be able to walk with a normal gait  OBJECTIVE:  Note: Objective measures were completed at Evaluation unless otherwise noted.  DIAGNOSTIC FINDINGS: N/A  COGNITION: Overall cognitive status: Within functional limits for tasks assessed   SENSATION: WFL  COORDINATION: Not formally assessed  EDEMA:  Not formally assessed, but none observed  MUSCLE TONE: appears WNL  MUSCLE LENGTH: Not formally assessed During supine testing, it is possible L LE is slightly longer than R LE, but didn't formally measure  DTRs:  Not formally assessed  POSTURE: rounded shoulders and forward head Need to assess spine curvature in more detail  LOWER EXTREMITY ROM:     Active   WFL/WNL throughout Right Eval Left Eval  Hip flexion    Hip extension    Hip abduction    Hip adduction    Hip internal rotation    Hip external rotation    Knee flexion    Knee extension    Ankle dorsiflexion     Ankle plantarflexion    Ankle inversion    Ankle eversion     (Blank rows = not tested)  LOWER EXTREMITY MMT:    MMT Right Eval Left Eval Right 6/17 Left 6/17  Hip flexion 4- 4+ 4   Hip extension      Hip abduction 2+  Tested in sidelying 4- Tested in sidelying 3- 4   Hip adduction      Hip internal rotation      Hip external rotation 3+ 4 4  Knee flexion 5 5    Knee extension 5 5    Ankle dorsiflexion 5 5    Ankle plantarflexion 5 5    Ankle inversion      Ankle eversion      (Blank rows = not tested)  Manual Muscle Test Scale 0/5 = No muscle contraction can be seen or felt 1/5 = Contraction can be felt, but there is no motion 2-/5 = Part moves through incomplete ROM w/ gravity decreased 2/5 = Part moves through complete ROM w/ gravity decreased 2+/5 = Part moves through incomplete ROM (<50%) against gravity or through complete ROM w/ gravity 3-/5 = Part moves through incomplete ROM (>50%) against gravity 3/5 = Part moves through complete ROM against gravity 3+/5 = Part moves through complete ROM against gravity/slight resistance 4-/5= Holds test position against slight to moderate pressure 4/5 = Part moves through complete ROM against gravity/moderate resistance 4+/5= Holds test position against moderate to strong pressure 5/5 = Part moves through complete ROM against gravity/full resistance  BED MOBILITY:  Findings: Sit to supine Modified independence Supine to sit Modified independence  TRANSFERS: Sit to stand: Complete Independence  Assistive device utilized: None     Stand to sit: Complete Independence  Assistive device utilized: None     Chair to chair: Complete Independence  Assistive device utilized: None       RAMP:  Not tested  CURB:  Not tested  STAIRS: Findings: Level of Assistance: Modified independence, Stair Negotiation Technique: Alternating Pattern  Forwards with Single Rail on Right Single Rail on Left Bilateral Rails, Number of  Stairs: 4, Height of Stairs: 6in   , and Comments: pt varying in railings used, but reciprocal pattern throughout GAIT: Findings: Gait Characteristics: trendelenburg and lateral lean- Left, Distance walked: 167ft, Assistive device utilized:None, Level of assistance: Complete Independence, and Comments: L lateral trunk lean during every R stance phase due to R hip weakness and possible leg length discrepancy     FUNCTIONAL TESTS:  5 times sit to stand: 12.97 seconds 6 minute walk test: need to assess 10 meter walk test: 1.71m/s without AD Berg Balance Scale: need to assess Functional gait assessment: need to assess  PATIENT SURVEYS:  ABC scale 94.375% with pt reporting least confidence standing on chair to reach for something, stepping on/off escalator without holding on, and walking on ice - otherwise pt reporting 95-100% on other items                                                                                                                               TREATMENT DATE: 02/07/2024  TE- To improve strength, endurance, mobility, and function of specific targeted muscle groups or improve joint range of motion or improve muscle flexibility  Bridge with march  2 x 10   Side plank on wall 4 x 30 sec ea side   Wall bird dog  - added step to R LE to allow  proper form on last set of 10   Gait training - mirror for feedback, focus on pace that does not allow scissoring gait ( faster rather than slower)   X 5 min, used floor tape to allow visual cue to prevent scissoring gait on the left   TA- To improve functional movements patterns for everyday tasks    Standing sidestepping x10 ea with YTB around ankles and mini squat on each rep   Standing R LE side step up to step training with UE assist 2 x 10   Standing on trampoline x 10 ea with marching focus on maintaining neutral hip position      PATIENT EDUCATION: Education details: Pt educated throughout session about proper  posture and technique with exercises. Improved exercise technique, movement at target joints, use of target muscles after min to mod verbal, visual, tactile cues. Person educated: Patient Education method: Explanation and Handouts Education comprehension: verbalized understanding and needs further education  HOME EXERCISE PROGRAM:  Access Code: [email or web address] URL: [email or web address] Date: 02/07/2024 Prepared by: Lonni Gainer  Exercises - Supine Bridge with Resistance Band  - 1 x daily - 7 x weekly - 2 sets - 15 reps - Marching Bridge  - 1 x daily - 7 x weekly - 2 sets - 10 reps - Supine Hip Adduction Isometric with Ball  - 1 x daily - 7 x weekly - 2 sets - 10 reps - 3-5 sec  hold - Standing Side Plank on Wall  - 1 x daily - 7 x weekly - 3 sets - 30 second  hold - Side Stepping with Resistance at Thighs and Counter Support  - 1 x daily - 7 x weekly - 2-3 sets - 10 reps - Modified Bird Dog At Wall  - 1 x daily - 7 x weekly - 2 sets - 10 reps   GOALS: Goals reviewed with patient? Yes  SHORT TERM GOALS: Target date: 01/26/2024   Patient will be independent in home exercise program to improve strength/mobility for better functional independence with ADLs.  Baseline: 12/15/2023 initial HEP provided 6/17: pt completing regularly Goal status: MET   LONG TERM GOALS: Target date: 03/08/2024  Patient will increase Berg Balance score to > 51/56 to demonstrate improved balance and decreased fall risk during functional activities and ADLs.  Baseline: 55 Goal status: MET  2.  Patient will increase six minute walk test distance to >1037ft for progression to community ambulator and improve gait ability  Baseline: 1080 ft  Goal status: MET  3.  Patient will increase Functional Gait Assessment (FGA) score to >20/30 as to reduce fall risk and improve dynamic gait safety with community ambulation.  Baseline: 23 Goal status: MET  4.  Patient will maintain 30 seconds of single leg  stance on each LE without loss of balance indicating increased hip strength and improved ability to ambulate and navigate stairs without trendelenburg pattern. Baseline: L LE: 5 seconds, R LE: 0 seconds 6/17: 17 sec, 3-4 sec Goal status: ONGOING  5.  Patient will increase BLE gross strength to 4+/5 as to improve functional strength for independent gait, increased standing tolerance and increased ADL ability.  Baseline: see above Goal status: ONGOING  7.  Patient will improve lower extremity functional scale by 10 points or greater in order to indicate improved function as it relates to her lower extremity weakness. Baseline: 50 Goal status: INITIAL  8.  Patient will report and demonstrate little to no difficulty when ascending 10 stairs or  more in order to improve her safety and confidence with community ambulation and accessing her environment. Baseline: moderate difficulty Goal status: INITIAL     ASSESSMENT:  CLINICAL IMPRESSION: Pt presents for progress note this date. Pt shows great progress towards general balance related goals and outcome measures, however, pt still having gross abnormality with her gait.  Patient completes lower extremity functional scale and scores a 50 out of 80 demonstrating significant impairment as a result of her right lower extremity weakness.  Patient single-leg stance time on the right side is improving but is still not at adequate or safe levels for prolonged single-leg stance time during gait activities.  During gait patient still showing scissoring style of gait particularly with gait at slower speeds where she has to rely on right lower extremity single-leg stance for longer amounts of time.  Adding goals specifically related to right lower extremity stability and lower extremity functional scale this date to continue to track patient's progress she continues with therapy. Patient's condition has the potential to improve in response to therapy. Maximum  improvement is yet to be obtained. The anticipated improvement is attainable and reasonable in a generally predictable time.  Pt will continue to benefit from skilled physical therapy intervention to address impairments, improve QOL, and attain therapy goals.    OBJECTIVE IMPAIRMENTS: Abnormal gait, decreased activity tolerance, decreased balance, decreased endurance, decreased knowledge of condition, decreased mobility, difficulty walking, decreased strength, improper body mechanics, and pain.   ACTIVITY LIMITATIONS: carrying, lifting, bending, standing, squatting, stairs, transfers, dressing, locomotion level, and caring for others  PARTICIPATION LIMITATIONS: cleaning, laundry, shopping, and community activity  PERSONAL FACTORS: Age, Sex, Time since onset of injury/illness/exacerbation, and 3+ comorbidities: Rheumatoid arthritis, obesity, prediabetes, HTN are also affecting patient's functional outcome.   REHAB POTENTIAL: Good  CLINICAL DECISION MAKING: Stable/uncomplicated  EVALUATION COMPLEXITY: Low  PLAN:  PT FREQUENCY: 1-2x/week  PT DURATION: 12 weeks  PLANNED INTERVENTIONS: [id number]- PT Re-evaluation, 97750- Physical Performance Testing, 97110-Therapeutic exercises, 97530- Therapeutic activity, [id number]- Neuromuscular re-education, 97535- Self Care, [id number]- Manual therapy, [id number]- Gait training, [phone]- Orthotic Initial, [phone]- Orthotic/Prosthetic subsequent, [phone]- Canalith repositioning, [phone]- Electrical stimulation (manual), Patient/Family education, Balance training, Stair training, Taping, Dry Needling, Joint mobilization, Joint manipulation, Spinal mobilization, Vestibular training, DME instructions, Cryotherapy, Moist heat, and Biofeedback  PLAN FOR NEXT SESSION:  - Need to assess spine curvature in more detail - focus on hip strengthening, specifically R hip abductors to decrease trendelenburg during gait -progress with static balance, particularly on R LE    Note: Portions of  this document were prepared using Dragon voice recognition software and although reviewed may contain unintentional dictation errors in syntax, grammar, or spelling.  Lonni KATHEE Gainer PT ,DPT Physical Therapist- [facility]   2:26 PM 02/07/24

## 2024-02-09 ENCOUNTER — Ambulatory Visit: Payer: PRIVATE HEALTH INSURANCE | Admitting: Physical Therapy

## 2024-02-09 DIAGNOSIS — R531 Weakness: Secondary | ICD-10-CM

## 2024-02-09 DIAGNOSIS — R2681 Unsteadiness on feet: Secondary | ICD-10-CM

## 2024-02-09 DIAGNOSIS — R269 Unspecified abnormalities of gait and mobility: Secondary | ICD-10-CM

## 2024-02-09 NOTE — Therapy (Signed)
 OUTPATIENT PHYSICAL THERAPY NEURO TREATMENT   Patient Name: Rita Bailey MRN: [id number] DOB:1951/03/17, 73 y.o., female Today's Date: 02/09/2024   PCP: Myrla Jon HERO, MD REFERRING PROVIDER: Myrla Jon HERO, MD   END OF SESSION:   PT End of Session - 02/09/24 1529     Visit Number 12    Number of Visits 24    Date for PT Re-Evaluation 03/27/24    Progress Note Due on Visit 20    PT Start Time 1317    PT Stop Time 1359    PT Time Calculation (min) 42 min    Equipment Utilized During Treatment Gait belt    Activity Tolerance Patient tolerated treatment well    Behavior During Therapy WFL for tasks assessed/performed               Past Medical History:  Diagnosis Date   Allergy    Arthritis    Hypertension    Scoliosis of lumbosacral spine    Past Surgical History:  Procedure Laterality Date   CESAREAN SECTION     COLONOSCOPY WITH PROPOFOL  N/A 05/11/2017   Procedure: COLONOSCOPY WITH PROPOFOL ;  Surgeon: Dessa Reyes ORN, MD;  Location: [facility];  Service: Endoscopy;  Laterality: N/A;   COLONOSCOPY WITH PROPOFOL  N/A 12/16/2021   Procedure: COLONOSCOPY WITH PROPOFOL ;  Surgeon: Dessa Reyes ORN, MD;  Location: [facility];  Service: Endoscopy;  Laterality: N/A;   DILATATION & CURETTAGE/HYSTEROSCOPY WITH MYOSURE N/A 02/27/2016   Procedure: DILATATION & CURETTAGE/HYSTEROSCOPY WITH MYOSURE;  Surgeon: Debby JINNY Dinsmore, MD;  Location: [facility];  Service: Gynecology;  Laterality: N/A;   ESOPHAGOGASTRODUODENOSCOPY (EGD) WITH PROPOFOL  N/A 05/11/2017   Procedure: ESOPHAGOGASTRODUODENOSCOPY (EGD) WITH PROPOFOL ;  Surgeon: Dessa Reyes ORN, MD;  Location: [facility];  Service: Endoscopy;  Laterality: N/A;   sebaceous syst removed  08/24/2011   tonsillectomy     TONSILLECTOMY     Patient Active Problem List   Diagnosis Date Noted   Rheumatoid arthritis (HCC) 05/30/2023   Bilateral hand swelling 12/28/2022   Eczema 04/13/2018   Irritable  bowel syndrome with diarrhea 04/13/2018   Prediabetes 04/13/2018   PMB (postmenopausal bleeding) 10/05/2017   Gastro-esophageal reflux disease with esophagitis 05/13/2017   Strain of muscle of right hip 05/06/2017   Bilateral hearing loss 04/12/2017   Scoliosis of lumbosacral spine 03/30/2017   Abnormality of gait and mobility 03/30/2017   Allergic rhinitis 09/08/2015   Hypertension 09/08/2015   Gonalgia 09/08/2015   RAD (reactive airway disease) 09/08/2015   Obesity 03/12/2015   Current tear of meniscus 01/02/2015   Tear of meniscus of knee 01/02/2015    ONSET DATE: Years ago  REFERRING DIAG: R26.9 (ICD-10-CM) - Abnormal gait   THERAPY DIAG:  Generalized weakness  Unsteadiness on feet  Abnormality of gait  Rationale for Evaluation and Treatment: Rehabilitation  SUBJECTIVE:  SUBJECTIVE STATEMENT:  Pt reports some popping in the hip that felt painful with clamshells so she stopped this exercise.   From eval:  Patient reports she had previously went to [facility] to see Ortho and was referred for physical therapy, but due to logistical errors she did not start PT at that time.  Pt states these gait deviations started years ago. Reports no injury at the time they started. Pt states she has always walked a lot for exercise, but it is hard for her to do now. Pt states she has noticed her gait deviations because she has started getting L thigh pain, but otherwise does not have pain. Pt states these deviations impact her balance.  States she has difficulty walking slow because her gait deviations are more pronounced.   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 mother, and immobile husband due to him needing a hip replacement.  Pt  accompanied by: self  PERTINENT HISTORY: PMH: Rheumatoid arthritis, obesity, prediabetes, HTN  Per MD note on 12/13/2023: Trendelenburg gait due to hip weakness Trendelenburg gait due to hip weakness identified. Previous recommendation for physical therapy was not followed through due to logistical issues. - Refer to physical therapy for hip strengthening exercises  PAIN:  Are you having pain? None related to hip weakness, but will have pain in hands when overusing them due to RA  PRECAUTIONS: None  RED FLAGS: None   WEIGHT BEARING RESTRICTIONS: No  FALLS: Has patient fallen in last 6 months? Yes. Number of falls 1x tripped backwards over her cat  LIVING ENVIRONMENT: Lives with: lives with their spouse and lives with their son (husband and special needs son) Lives in: House/apartment - 1 level Stairs: Yes: External: 1 steps; on left going up Has following equipment at home: shower chair and Grab bars  PLOF: Independent, Independent with household mobility without device, and Independent with homemaking with ambulation  PATIENT GOALS: be able to walk with a normal gait  OBJECTIVE:  Note: Objective measures were completed at Evaluation unless otherwise noted.  DIAGNOSTIC FINDINGS: N/A  COGNITION: Overall cognitive status: Within functional limits for tasks assessed   SENSATION: WFL  COORDINATION: Not formally assessed  EDEMA:  Not formally assessed, but none observed  MUSCLE TONE: appears WNL  MUSCLE LENGTH: Not formally assessed During supine testing, it is possible L LE is slightly longer than R LE, but didn't formally measure  DTRs:  Not formally assessed  POSTURE: rounded shoulders and forward head Need to assess spine curvature in more detail  LOWER EXTREMITY ROM:     Active   WFL/WNL throughout Right Eval Left Eval  Hip flexion    Hip extension    Hip abduction    Hip adduction    Hip internal rotation    Hip external rotation    Knee  flexion    Knee extension    Ankle dorsiflexion    Ankle plantarflexion    Ankle inversion    Ankle eversion     (Blank rows = not tested)  LOWER EXTREMITY MMT:    MMT Right Eval Left Eval Right 6/17 Left 6/17  Hip flexion 4- 4+ 4   Hip extension      Hip abduction 2+  Tested in sidelying 4- Tested in sidelying 3- 4   Hip adduction      Hip internal rotation      Hip external rotation 3+ 4 4  Knee flexion 5 5    Knee extension 5 5    Ankle dorsiflexion 5 5    Ankle plantarflexion 5 5    Ankle inversion      Ankle eversion      (Blank rows = not tested)  Manual Muscle Test Scale 0/5 = No muscle contraction can be seen or felt 1/5 = Contraction can be felt, but there is no motion 2-/5 = Part moves through incomplete ROM w/ gravity decreased 2/5 = Part moves through complete ROM w/ gravity decreased 2+/5 = Part moves through incomplete ROM (<50%) against gravity or through complete ROM w/ gravity 3-/5 = Part moves through incomplete ROM (>50%) against gravity 3/5 = Part moves through complete ROM against gravity 3+/5 = Part moves through complete ROM against gravity/slight resistance 4-/5= Holds test position against slight to moderate pressure 4/5 = Part moves through complete ROM against gravity/moderate resistance 4+/5= Holds test position against moderate to strong pressure 5/5 = Part moves through complete ROM against gravity/full resistance  BED MOBILITY:  Findings: Sit to supine Modified independence Supine to sit Modified independence  TRANSFERS: Sit to stand: Complete Independence  Assistive device utilized: None     Stand to sit: Complete Independence  Assistive device utilized: None     Chair to chair: Complete Independence  Assistive device utilized: None       RAMP:  Not tested  CURB:  Not tested  STAIRS: Findings: Level of Assistance: Modified independence, Stair Negotiation Technique: Alternating Pattern  Forwards with Single Rail on  Right Single Rail on Left Bilateral Rails, Number of Stairs: 4, Height of Stairs: 6in   , and Comments: pt varying in railings used, but reciprocal pattern throughout GAIT: Findings: Gait Characteristics: trendelenburg and lateral lean- Left, Distance walked: 134ft, Assistive device utilized:None, Level of assistance: Complete Independence, and Comments: L lateral trunk lean during every R stance phase due to R hip weakness and possible leg length discrepancy     FUNCTIONAL TESTS:  5 times sit to stand: 12.97 seconds 6 minute walk test: need to assess 10 meter walk test: 1.20m/s without AD Berg Balance Scale: need to assess Functional gait assessment: need to assess  PATIENT SURVEYS:  ABC scale 94.375% with pt reporting least confidence standing on chair to reach for something, stepping on/off escalator without holding on, and walking on ice - otherwise pt reporting 95-100% on other items                                                                                                                               TREATMENT DATE: 02/09/2024  TE- To improve strength, endurance, mobility, and function of specific targeted muscle groups or improve joint range of motion or improve muscle flexibility  Bridge with march  2 x 15  SL hip ABD 2 x 10   Side plank on peanut ball sidelying on plinth 6 x 5  sec ea side, pt repots  higher difficulty  Wall bird dog 2 x 12, min A to maintain hip position with R LE SLS phase  - added step to R LE to allow proper form   Leg press with R LE eccentric SL phase - 40 lb, assistance with placing LLE back on plate each round to prevent back discomfort.   TA- To improve functional movements patterns for everyday tasks   Standing sidestepping 2x12 ea with YTB around mid shank   Standing R LE side step up to step training with UE assist 2 x 10       PATIENT EDUCATION: Education details: Pt educated throughout session about proper posture and technique  with exercises. Improved exercise technique, movement at target joints, use of target muscles after min to mod verbal, visual, tactile cues. Person educated: Patient Education method: Explanation and Handouts Education comprehension: verbalized understanding and needs further education  HOME EXERCISE PROGRAM:  Access Code: [email or web address] URL: [email or web address] Date: 02/09/2024 Prepared by: Lonni Gainer  Exercises - Supine Bridge with Resistance Band  - 1 x daily - 7 x weekly - 2 sets - 15 reps - Marching Bridge  - 1 x daily - 7 x weekly - 2 sets - 10 reps - Supine Hip Adduction Isometric with Ball  - 1 x daily - 7 x weekly - 2 sets - 10 reps - 3-5 sec  hold - Standing Side Plank on Wall  - 1 x daily - 7 x weekly - 3 sets - 30 second  hold - Side Stepping with Resistance at Thighs and Counter Support  - 1 x daily - 7 x weekly - 2-3 sets - 10 reps - Modified Bird Dog At Wall  - 1 x daily - 7 x weekly - 2 sets - 10 reps - Lateral Step Up with Counter Support  - 1 x daily - 7 x weekly - 2 sets - 10 reps   GOALS: Goals reviewed with patient? Yes  SHORT TERM GOALS: Target date: 01/26/2024   Patient will be independent in home exercise program to improve strength/mobility for better functional independence with ADLs.  Baseline: 12/15/2023 initial HEP provided 6/17: pt completing regularly Goal status: MET   LONG TERM GOALS: Target date: 03/08/2024  Patient will increase Berg Balance score to > 51/56 to demonstrate improved balance and decreased fall risk during functional activities and ADLs.  Baseline: 55 Goal status: MET  2.  Patient will increase six minute walk test distance to >1021ft for progression to community ambulator and improve gait ability  Baseline: 1080 ft  Goal status: MET  3.  Patient will increase Functional Gait Assessment (FGA) score to >20/30 as to reduce fall risk and improve dynamic gait safety with community ambulation.  Baseline: 23 Goal  status: MET  4.  Patient will maintain 30 seconds of single leg stance on each LE without loss of balance indicating increased hip strength and improved ability to ambulate and navigate stairs without trendelenburg pattern. Baseline: L LE: 5 seconds, R LE: 0 seconds 6/17: 17 sec, 3-4 sec Goal status: ONGOING  5.  Patient will increase BLE gross strength to 4+/5 as to improve functional strength for independent gait, increased standing tolerance and increased ADL ability.  Baseline: see above Goal status: ONGOING  7.  Patient will improve lower extremity functional scale by 10 points or greater in order to indicate improved function as it relates to her lower extremity weakness. Baseline: 50 Goal status: INITIAL  8.  Patient will report and  demonstrate little to no difficulty when ascending 10 stairs or more in order to improve her safety and confidence with community ambulation and accessing her environment. Baseline: moderate difficulty Goal status: INITIAL     ASSESSMENT:  CLINICAL IMPRESSION:  Continued with current plan of care as laid out in evaluation and recent prior sessions. Pt remains motivated to advance progress toward goals in order to maximize independence and safety at home. Pt requires high level assistance and cuing for completion of exercises in order to provide adequate level of stimulation challenge while minimizing pain and discomfort when possible.  Pt closely monitored throughout session pt response and to maximize patient safety during interventions. Progressed with closed chain hip abductor strength with SL on peanut p ball to off weight shoulders with good results. Updated HEP to add side step up over the next 2 weeks where she will be on vacation and consequently missing therapy. Pt continues to demonstrate progress toward goals AEB progression of interventions this date either in volume or intensity.    OBJECTIVE IMPAIRMENTS: Abnormal gait, decreased activity  tolerance, decreased balance, decreased endurance, decreased knowledge of condition, decreased mobility, difficulty walking, decreased strength, improper body mechanics, and pain.   ACTIVITY LIMITATIONS: carrying, lifting, bending, standing, squatting, stairs, transfers, dressing, locomotion level, and caring for others  PARTICIPATION LIMITATIONS: cleaning, laundry, shopping, and community activity  PERSONAL FACTORS: Age, Sex, Time since onset of injury/illness/exacerbation, and 3+ comorbidities: Rheumatoid arthritis, obesity, prediabetes, HTN are also affecting patient's functional outcome.   REHAB POTENTIAL: Good  CLINICAL DECISION MAKING: Stable/uncomplicated  EVALUATION COMPLEXITY: Low  PLAN:  PT FREQUENCY: 1-2x/week  PT DURATION: 12 weeks  PLANNED INTERVENTIONS: 97164- PT Re-evaluation, 97750- Physical Performance Testing, 97110-Therapeutic exercises, 97530- Therapeutic activity, [id number]- Neuromuscular re-education, 97535- Self Care, [id number]- Manual therapy, [id number]- Gait training, [phone]- Orthotic Initial, [phone]- Orthotic/Prosthetic subsequent, [phone]- Canalith repositioning, [phone]- Electrical stimulation (manual), Patient/Family education, Balance training, Stair training, Taping, Dry Needling, Joint mobilization, Joint manipulation, Spinal mobilization, Vestibular training, DME instructions, Cryotherapy, Moist heat, and Biofeedback  PLAN FOR NEXT SESSION:  - Need to assess spine curvature in more detail - focus on hip strengthening, specifically R hip abductors to decrease trendelenburg during gait -progress with static balance, particularly on R LE    Note: Portions of this document were prepared using Dragon voice recognition software and although reviewed may contain unintentional dictation errors in syntax, grammar, or spelling.  Lonni KATHEE Gainer PT ,DPT Physical Therapist- [place]  [facility]   3:30 PM 02/09/24

## 2024-02-14 ENCOUNTER — Ambulatory Visit: Payer: PRIVATE HEALTH INSURANCE | Admitting: Physical Therapy

## 2024-02-16 ENCOUNTER — Ambulatory Visit: Payer: PRIVATE HEALTH INSURANCE | Admitting: Physical Therapy

## 2024-02-21 ENCOUNTER — Ambulatory Visit: Payer: PRIVATE HEALTH INSURANCE | Admitting: Physical Therapy

## 2024-02-23 ENCOUNTER — Ambulatory Visit: Payer: PRIVATE HEALTH INSURANCE | Admitting: Physical Therapy

## 2024-02-28 ENCOUNTER — Ambulatory Visit: Payer: PRIVATE HEALTH INSURANCE | Attending: Family Medicine | Admitting: Physical Therapy

## 2024-02-28 DIAGNOSIS — R531 Weakness: Secondary | ICD-10-CM | POA: Diagnosis present

## 2024-02-28 DIAGNOSIS — R2681 Unsteadiness on feet: Secondary | ICD-10-CM | POA: Insufficient documentation

## 2024-02-28 DIAGNOSIS — R269 Unspecified abnormalities of gait and mobility: Secondary | ICD-10-CM | POA: Diagnosis present

## 2024-02-28 NOTE — Therapy (Signed)
 OUTPATIENT PHYSICAL THERAPY NEURO TREATMENT   Patient Name: Rita Bailey MRN: [id number] DOB:1951-01-16, 73 y.o., female Today's Date: 02/28/2024   PCP: Myrla Jon HERO, MD REFERRING PROVIDER: Myrla Jon HERO, MD   END OF SESSION:   PT End of Session - 02/28/24 1318     Visit Number 13    Number of Visits 24    Date for PT Re-Evaluation 03/27/24    Progress Note Due on Visit 20    PT Start Time 1318    PT Stop Time 1358    PT Time Calculation (min) 40 min    Equipment Utilized During Treatment Gait belt    Activity Tolerance Patient tolerated treatment well    Behavior During Therapy WFL for tasks assessed/performed                Past Medical History:  Diagnosis Date   Allergy    Arthritis    Hypertension    Scoliosis of lumbosacral spine    Past Surgical History:  Procedure Laterality Date   CESAREAN SECTION     COLONOSCOPY WITH PROPOFOL  N/A 05/11/2017   Procedure: COLONOSCOPY WITH PROPOFOL ;  Surgeon: Dessa Reyes ORN, MD;  Location: [facility];  Service: Endoscopy;  Laterality: N/A;   COLONOSCOPY WITH PROPOFOL  N/A 12/16/2021   Procedure: COLONOSCOPY WITH PROPOFOL ;  Surgeon: Dessa Reyes ORN, MD;  Location: [facility];  Service: Endoscopy;  Laterality: N/A;   DILATATION & CURETTAGE/HYSTEROSCOPY WITH MYOSURE N/A 02/27/2016   Procedure: DILATATION & CURETTAGE/HYSTEROSCOPY WITH MYOSURE;  Surgeon: Debby JINNY Dinsmore, MD;  Location: [facility];  Service: Gynecology;  Laterality: N/A;   ESOPHAGOGASTRODUODENOSCOPY (EGD) WITH PROPOFOL  N/A 05/11/2017   Procedure: ESOPHAGOGASTRODUODENOSCOPY (EGD) WITH PROPOFOL ;  Surgeon: Dessa Reyes ORN, MD;  Location: [facility];  Service: Endoscopy;  Laterality: N/A;   sebaceous syst removed  08/24/2011   tonsillectomy     TONSILLECTOMY     Patient Active Problem List   Diagnosis Date Noted   Rheumatoid arthritis (HCC) 05/30/2023   Bilateral hand swelling 12/28/2022   Eczema 04/13/2018   Irritable  bowel syndrome with diarrhea 04/13/2018   Prediabetes 04/13/2018   PMB (postmenopausal bleeding) 10/05/2017   Gastro-esophageal reflux disease with esophagitis 05/13/2017   Strain of muscle of right hip 05/06/2017   Bilateral hearing loss 04/12/2017   Scoliosis of lumbosacral spine 03/30/2017   Abnormality of gait and mobility 03/30/2017   Allergic rhinitis 09/08/2015   Hypertension 09/08/2015   Gonalgia 09/08/2015   RAD (reactive airway disease) 09/08/2015   Obesity 03/12/2015   Current tear of meniscus 01/02/2015   Tear of meniscus of knee 01/02/2015    ONSET DATE: Years ago  REFERRING DIAG: R26.9 (ICD-10-CM) - Abnormal gait   THERAPY DIAG:  Generalized weakness  Unsteadiness on feet  Abnormality of gait  Rationale for Evaluation and Treatment: Rehabilitation  SUBJECTIVE:  SUBJECTIVE STATEMENT:  Pt reports taking a short break when on vacation as compared to doing exercises every day. She is feeling more relaxed.   From eval:  Patient reports she had previously went to [facility] to see Ortho and was referred for physical therapy, but due to logistical errors she did not start PT at that time.  Pt states these gait deviations started years ago. Reports no injury at the time they started. Pt states she has always walked a lot for exercise, but it is hard for her to do now. Pt states she has noticed her gait deviations because she has started getting L thigh pain, but otherwise does not have pain. Pt states these deviations impact her balance.  States she has difficulty walking slow because her gait deviations are more pronounced.   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 mother, and immobile husband due to him needing a hip  replacement.  Pt accompanied by: self  PERTINENT HISTORY: PMH: Rheumatoid arthritis, obesity, prediabetes, HTN  Per MD note on 12/13/2023: Trendelenburg gait due to hip weakness Trendelenburg gait due to hip weakness identified. Previous recommendation for physical therapy was not followed through due to logistical issues. - Refer to physical therapy for hip strengthening exercises  PAIN:  Are you having pain? None related to hip weakness, but will have pain in hands when overusing them due to RA  PRECAUTIONS: None  RED FLAGS: None   WEIGHT BEARING RESTRICTIONS: No  FALLS: Has patient fallen in last 6 months? Yes. Number of falls 1x tripped backwards over her cat  LIVING ENVIRONMENT: Lives with: lives with their spouse and lives with their son (husband and special needs son) Lives in: House/apartment - 1 level Stairs: Yes: External: 1 steps; on left going up Has following equipment at home: shower chair and Grab bars  PLOF: Independent, Independent with household mobility without device, and Independent with homemaking with ambulation  PATIENT GOALS: be able to walk with a normal gait  OBJECTIVE:  Note: Objective measures were completed at Evaluation unless otherwise noted.  DIAGNOSTIC FINDINGS: N/A  COGNITION: Overall cognitive status: Within functional limits for tasks assessed   SENSATION: WFL  COORDINATION: Not formally assessed  EDEMA:  Not formally assessed, but none observed  MUSCLE TONE: appears WNL  MUSCLE LENGTH: Not formally assessed During supine testing, it is possible L LE is slightly longer than R LE, but didn't formally measure  DTRs:  Not formally assessed  POSTURE: rounded shoulders and forward head Need to assess spine curvature in more detail  LOWER EXTREMITY ROM:     Active   WFL/WNL throughout Right Eval Left Eval  Hip flexion    Hip extension    Hip abduction    Hip adduction    Hip internal rotation    Hip external  rotation    Knee flexion    Knee extension    Ankle dorsiflexion    Ankle plantarflexion    Ankle inversion    Ankle eversion     (Blank rows = not tested)  LOWER EXTREMITY MMT:    MMT Right Eval Left Eval Right 6/17 Left 6/17  Hip flexion 4- 4+ 4   Hip extension      Hip abduction 2+  Tested in sidelying 4- Tested in sidelying 3- 4   Hip adduction      Hip internal rotation      Hip external rotation 3+  4 4   Knee flexion 5 5    Knee extension 5 5    Ankle dorsiflexion 5 5    Ankle plantarflexion 5 5    Ankle inversion      Ankle eversion      (Blank rows = not tested)  Manual Muscle Test Scale 0/5 = No muscle contraction can be seen or felt 1/5 = Contraction can be felt, but there is no motion 2-/5 = Part moves through incomplete ROM w/ gravity decreased 2/5 = Part moves through complete ROM w/ gravity decreased 2+/5 = Part moves through incomplete ROM (<50%) against gravity or through complete ROM w/ gravity 3-/5 = Part moves through incomplete ROM (>50%) against gravity 3/5 = Part moves through complete ROM against gravity 3+/5 = Part moves through complete ROM against gravity/slight resistance 4-/5= Holds test position against slight to moderate pressure 4/5 = Part moves through complete ROM against gravity/moderate resistance 4+/5= Holds test position against moderate to strong pressure 5/5 = Part moves through complete ROM against gravity/full resistance  BED MOBILITY:  Findings: Sit to supine Modified independence Supine to sit Modified independence  TRANSFERS: Sit to stand: Complete Independence  Assistive device utilized: None     Stand to sit: Complete Independence  Assistive device utilized: None     Chair to chair: Complete Independence  Assistive device utilized: None       RAMP:  Not tested  CURB:  Not tested  STAIRS: Findings: Level of Assistance: Modified independence, Stair Negotiation Technique: Alternating Pattern  Forwards with Single  Rail on Right Single Rail on Left Bilateral Rails, Number of Stairs: 4, Height of Stairs: 6in   , and Comments: pt varying in railings used, but reciprocal pattern throughout GAIT: Findings: Gait Characteristics: trendelenburg and lateral lean- Left, Distance walked: 141ft, Assistive device utilized:None, Level of assistance: Complete Independence, and Comments: L lateral trunk lean during every R stance phase due to R hip weakness and possible leg length discrepancy     FUNCTIONAL TESTS:  5 times sit to stand: 12.97 seconds 6 minute walk test: need to assess 10 meter walk test: 1.57m/s without AD Berg Balance Scale: need to assess Functional gait assessment: need to assess  PATIENT SURVEYS:  ABC scale 94.375% with pt reporting least confidence standing on chair to reach for something, stepping on/off escalator without holding on, and walking on ice - otherwise pt reporting 95-100% on other items                                                                                                                               TREATMENT DATE: 02/28/2024  TE- To improve strength, endurance, mobility, and function of specific targeted muscle groups or improve joint range of motion or improve muscle flexibility  Bridge with march  2 x 15  Side plank on peanut ball sidelying on plinth 6 x 5  sec ea side, pt repots higher difficulty  Wall  bird dog 2 x 12, min A to maintain hip position with R LE SLS phase  - added step to R LE to allow proper form   Leg press with R LE eccentric SL phase - 40 lb, assistance with placing LLE back on plate each round to prevent back discomfort.   TA- To improve functional movements patterns for everyday tasks   Standing sidestepping 2x12 ea with YTB around mid shank   Standing R LE side step up to step training with UE assist 2 x 10   Gait as speed slower than normal with focus on avoiding scissoring gait x 150 ft with ext cue of tiles on floor to avoid  scissoring gait, improved since last session.     PATIENT EDUCATION: Education details: Pt educated throughout session about proper posture and technique with exercises. Improved exercise technique, movement at target joints, use of target muscles after min to mod verbal, visual, tactile cues. Person educated: Patient Education method: Explanation and Handouts Education comprehension: verbalized understanding and needs further education  HOME EXERCISE PROGRAM:  Access Code: [email or web address] URL: [email or web address] Date: 02/09/2024 Prepared by: Lonni Gainer  Exercises - Supine Bridge with Resistance Band  - 1 x daily - 7 x weekly - 2 sets - 15 reps - Marching Bridge  - 1 x daily - 7 x weekly - 2 sets - 10 reps - Supine Hip Adduction Isometric with Ball  - 1 x daily - 7 x weekly - 2 sets - 10 reps - 3-5 sec  hold - Standing Side Plank on Wall  - 1 x daily - 7 x weekly - 3 sets - 30 second  hold - Side Stepping with Resistance at Thighs and Counter Support  - 1 x daily - 7 x weekly - 2-3 sets - 10 reps - Modified Bird Dog At Wall  - 1 x daily - 7 x weekly - 2 sets - 10 reps - Lateral Step Up with Counter Support  - 1 x daily - 7 x weekly - 2 sets - 10 reps   GOALS: Goals reviewed with patient? Yes  SHORT TERM GOALS: Target date: 01/26/2024   Patient will be independent in home exercise program to improve strength/mobility for better functional independence with ADLs.  Baseline: 12/15/2023 initial HEP provided 6/17: pt completing regularly Goal status: MET   LONG TERM GOALS: Target date: 03/08/2024  Patient will increase Berg Balance score to > 51/56 to demonstrate improved balance and decreased fall risk during functional activities and ADLs.  Baseline: 55 Goal status: MET  2.  Patient will increase six minute walk test distance to >1058ft for progression to community ambulator and improve gait ability  Baseline: 1080 ft  Goal status: MET  3.  Patient will  increase Functional Gait Assessment (FGA) score to >20/30 as to reduce fall risk and improve dynamic gait safety with community ambulation.  Baseline: 23 Goal status: MET  4.  Patient will maintain 30 seconds of single leg stance on each LE without loss of balance indicating increased hip strength and improved ability to ambulate and navigate stairs without trendelenburg pattern. Baseline: L LE: 5 seconds, R LE: 0 seconds 6/17: 17 sec, 3-4 sec Goal status: ONGOING  5.  Patient will increase BLE gross strength to 4+/5 as to improve functional strength for independent gait, increased standing tolerance and increased ADL ability.  Baseline: see above Goal status: ONGOING  7.  Patient will improve lower extremity functional scale by 10 points or greater  in order to indicate improved function as it relates to her lower extremity weakness. Baseline: 50 Goal status: INITIAL  8.  Patient will report and demonstrate little to no difficulty when ascending 10 stairs or more in order to improve her safety and confidence with community ambulation and accessing her environment. Baseline: moderate difficulty Goal status: INITIAL     ASSESSMENT:  CLINICAL IMPRESSION:  Continued with current plan of care as laid out in evaluation and recent prior sessions. Pt remains motivated to advance progress toward goals in order to maximize independence and safety at home. Pt requires high level assistance and cuing for completion of exercises in order to provide adequate level of stimulation challenge while minimizing pain and discomfort when possible.  Pt closely monitored throughout session pt response and to maximize patient safety during interventions. Continued with closed chain hip abductor strength with SL on peanut p ball to off weight shoulders with good results. Pt gait showing improvement but still has very notable trendelenburg gait. Pt continues to demonstrate progress toward goals AEB progression of  interventions this date either in volume or intensity.    OBJECTIVE IMPAIRMENTS: Abnormal gait, decreased activity tolerance, decreased balance, decreased endurance, decreased knowledge of condition, decreased mobility, difficulty walking, decreased strength, improper body mechanics, and pain.   ACTIVITY LIMITATIONS: carrying, lifting, bending, standing, squatting, stairs, transfers, dressing, locomotion level, and caring for others  PARTICIPATION LIMITATIONS: cleaning, laundry, shopping, and community activity  PERSONAL FACTORS: Age, Sex, Time since onset of injury/illness/exacerbation, and 3+ comorbidities: Rheumatoid arthritis, obesity, prediabetes, HTN are also affecting patient's functional outcome.   REHAB POTENTIAL: Good  CLINICAL DECISION MAKING: Stable/uncomplicated  EVALUATION COMPLEXITY: Low  PLAN:  PT FREQUENCY: 1-2x/week  PT DURATION: 12 weeks  PLANNED INTERVENTIONS: 97164- PT Re-evaluation, 97750- Physical Performance Testing, 97110-Therapeutic exercises, 97530- Therapeutic activity, [id number]- Neuromuscular re-education, 97535- Self Care, [id number]- Manual therapy, [id number]- Gait training, [phone]- Orthotic Initial, [phone]- Orthotic/Prosthetic subsequent, [phone]- Canalith repositioning, [phone]- Electrical stimulation (manual), Patient/Family education, Balance training, Stair training, Taping, Dry Needling, Joint mobilization, Joint manipulation, Spinal mobilization, Vestibular training, DME instructions, Cryotherapy, Moist heat, and Biofeedback  PLAN FOR NEXT SESSION:  - Need to assess spine curvature in more detail - focus on hip strengthening, specifically R hip abductors to decrease trendelenburg during gait -progress with static balance, particularly on R LE    Note: Portions of this document were prepared using Dragon voice recognition software and although reviewed may contain unintentional dictation errors in syntax, grammar, or spelling.  Lonni KATHEE Gainer PT ,DPT Physical  Therapist- [facility]   1:19 PM 02/28/24

## 2024-03-01 ENCOUNTER — Ambulatory Visit: Payer: PRIVATE HEALTH INSURANCE | Admitting: Physical Therapy

## 2024-03-01 DIAGNOSIS — R531 Weakness: Secondary | ICD-10-CM | POA: Diagnosis not present

## 2024-03-01 DIAGNOSIS — R2681 Unsteadiness on feet: Secondary | ICD-10-CM

## 2024-03-01 DIAGNOSIS — R269 Unspecified abnormalities of gait and mobility: Secondary | ICD-10-CM

## 2024-03-01 NOTE — Therapy (Signed)
 OUTPATIENT PHYSICAL THERAPY NEURO TREATMENT   Patient Name: Rita Bailey MRN: [id number] DOB:31-Dec-1950, 73 y.o., female Today's Date: 03/01/2024   PCP: Myrla Jon HERO, MD REFERRING PROVIDER: Myrla Jon HERO, MD   END OF SESSION:   PT End of Session - 03/01/24 1313     Visit Number 14    Number of Visits 24    Date for PT Re-Evaluation 03/27/24    Progress Note Due on Visit 20    PT Start Time 1317    PT Stop Time 1357    PT Time Calculation (min) 40 min    Equipment Utilized During Treatment Gait belt    Activity Tolerance Patient tolerated treatment well    Behavior During Therapy WFL for tasks assessed/performed                Past Medical History:  Diagnosis Date   Allergy    Arthritis    Hypertension    Scoliosis of lumbosacral spine    Past Surgical History:  Procedure Laterality Date   CESAREAN SECTION     COLONOSCOPY WITH PROPOFOL  N/A 05/11/2017   Procedure: COLONOSCOPY WITH PROPOFOL ;  Surgeon: Dessa Reyes ORN, MD;  Location: [facility];  Service: Endoscopy;  Laterality: N/A;   COLONOSCOPY WITH PROPOFOL  N/A 12/16/2021   Procedure: COLONOSCOPY WITH PROPOFOL ;  Surgeon: Dessa Reyes ORN, MD;  Location: [facility];  Service: Endoscopy;  Laterality: N/A;   DILATATION & CURETTAGE/HYSTEROSCOPY WITH MYOSURE N/A 02/27/2016   Procedure: DILATATION & CURETTAGE/HYSTEROSCOPY WITH MYOSURE;  Surgeon: Debby JINNY Dinsmore, MD;  Location: [facility];  Service: Gynecology;  Laterality: N/A;   ESOPHAGOGASTRODUODENOSCOPY (EGD) WITH PROPOFOL  N/A 05/11/2017   Procedure: ESOPHAGOGASTRODUODENOSCOPY (EGD) WITH PROPOFOL ;  Surgeon: Dessa Reyes ORN, MD;  Location: [facility];  Service: Endoscopy;  Laterality: N/A;   sebaceous syst removed  08/24/2011   tonsillectomy     TONSILLECTOMY     Patient Active Problem List   Diagnosis Date Noted   Rheumatoid arthritis (HCC) 05/30/2023   Bilateral hand swelling 12/28/2022   Eczema 04/13/2018   Irritable  bowel syndrome with diarrhea 04/13/2018   Prediabetes 04/13/2018   PMB (postmenopausal bleeding) 10/05/2017   Gastro-esophageal reflux disease with esophagitis 05/13/2017   Strain of muscle of right hip 05/06/2017   Bilateral hearing loss 04/12/2017   Scoliosis of lumbosacral spine 03/30/2017   Abnormality of gait and mobility 03/30/2017   Allergic rhinitis 09/08/2015   Hypertension 09/08/2015   Gonalgia 09/08/2015   RAD (reactive airway disease) 09/08/2015   Obesity 03/12/2015   Current tear of meniscus 01/02/2015   Tear of meniscus of knee 01/02/2015    ONSET DATE: Years ago  REFERRING DIAG: R26.9 (ICD-10-CM) - Abnormal gait   THERAPY DIAG:  Generalized weakness  Unsteadiness on feet  Abnormality of gait  Rationale for Evaluation and Treatment: Rehabilitation  SUBJECTIVE:  SUBJECTIVE STATEMENT:  Pt reports taking a short break when on vacation as compared to doing exercises every day. She is feeling more relaxed.   From eval:  Patient reports she had previously went to [facility] to see Ortho and was referred for physical therapy, but due to logistical errors she did not start PT at that time.  Pt states these gait deviations started years ago. Reports no injury at the time they started. Pt states she has always walked a lot for exercise, but it is hard for her to do now. Pt states she has noticed her gait deviations because she has started getting L thigh pain, but otherwise does not have pain. Pt states these deviations impact her balance.  States she has difficulty walking slow because her gait deviations are more pronounced.   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 mother, and immobile husband due to him needing a hip  replacement.  Pt accompanied by: self  PERTINENT HISTORY: PMH: Rheumatoid arthritis, obesity, prediabetes, HTN  Per MD note on 12/13/2023: Trendelenburg gait due to hip weakness Trendelenburg gait due to hip weakness identified. Previous recommendation for physical therapy was not followed through due to logistical issues. - Refer to physical therapy for hip strengthening exercises  PAIN:  Are you having pain? None related to hip weakness, but will have pain in hands when overusing them due to RA  PRECAUTIONS: None  RED FLAGS: None   WEIGHT BEARING RESTRICTIONS: No  FALLS: Has patient fallen in last 6 months? Yes. Number of falls 1x tripped backwards over her cat  LIVING ENVIRONMENT: Lives with: lives with their spouse and lives with their son (husband and special needs son) Lives in: House/apartment - 1 level Stairs: Yes: External: 1 steps; on left going up Has following equipment at home: shower chair and Grab bars  PLOF: Independent, Independent with household mobility without device, and Independent with homemaking with ambulation  PATIENT GOALS: be able to walk with a normal gait  OBJECTIVE:  Note: Objective measures were completed at Evaluation unless otherwise noted.  DIAGNOSTIC FINDINGS: N/A  COGNITION: Overall cognitive status: Within functional limits for tasks assessed   SENSATION: WFL  COORDINATION: Not formally assessed  EDEMA:  Not formally assessed, but none observed  MUSCLE TONE: appears WNL  MUSCLE LENGTH: Not formally assessed During supine testing, it is possible L LE is slightly longer than R LE, but didn't formally measure  DTRs:  Not formally assessed  POSTURE: rounded shoulders and forward head Need to assess spine curvature in more detail  LOWER EXTREMITY ROM:     Active   WFL/WNL throughout Right Eval Left Eval  Hip flexion    Hip extension    Hip abduction    Hip adduction    Hip internal rotation    Hip external  rotation    Knee flexion    Knee extension    Ankle dorsiflexion    Ankle plantarflexion    Ankle inversion    Ankle eversion     (Blank rows = not tested)  LOWER EXTREMITY MMT:    MMT Right Eval Left Eval Right 6/17 Left 6/17  Hip flexion 4- 4+ 4   Hip extension      Hip abduction 2+  Tested in sidelying 4- Tested in sidelying 3- 4   Hip adduction      Hip internal rotation      Hip external rotation 3+  4 4   Knee flexion 5 5    Knee extension 5 5    Ankle dorsiflexion 5 5    Ankle plantarflexion 5 5    Ankle inversion      Ankle eversion      (Blank rows = not tested)  Manual Muscle Test Scale 0/5 = No muscle contraction can be seen or felt 1/5 = Contraction can be felt, but there is no motion 2-/5 = Part moves through incomplete ROM w/ gravity decreased 2/5 = Part moves through complete ROM w/ gravity decreased 2+/5 = Part moves through incomplete ROM (<50%) against gravity or through complete ROM w/ gravity 3-/5 = Part moves through incomplete ROM (>50%) against gravity 3/5 = Part moves through complete ROM against gravity 3+/5 = Part moves through complete ROM against gravity/slight resistance 4-/5= Holds test position against slight to moderate pressure 4/5 = Part moves through complete ROM against gravity/moderate resistance 4+/5= Holds test position against moderate to strong pressure 5/5 = Part moves through complete ROM against gravity/full resistance  BED MOBILITY:  Findings: Sit to supine Modified independence Supine to sit Modified independence  TRANSFERS: Sit to stand: Complete Independence  Assistive device utilized: None     Stand to sit: Complete Independence  Assistive device utilized: None     Chair to chair: Complete Independence  Assistive device utilized: None       RAMP:  Not tested  CURB:  Not tested  STAIRS: Findings: Level of Assistance: Modified independence, Stair Negotiation Technique: Alternating Pattern  Forwards with Single  Rail on Right Single Rail on Left Bilateral Rails, Number of Stairs: 4, Height of Stairs: 6in   , and Comments: pt varying in railings used, but reciprocal pattern throughout GAIT: Findings: Gait Characteristics: trendelenburg and lateral lean- Left, Distance walked: 115ft, Assistive device utilized:None, Level of assistance: Complete Independence, and Comments: L lateral trunk lean during every R stance phase due to R hip weakness and possible leg length discrepancy     FUNCTIONAL TESTS:  5 times sit to stand: 12.97 seconds 6 minute walk test: need to assess 10 meter walk test: 1.64m/s without AD Berg Balance Scale: need to assess Functional gait assessment: need to assess  PATIENT SURVEYS:  ABC scale 94.375% with pt reporting least confidence standing on chair to reach for something, stepping on/off escalator without holding on, and walking on ice - otherwise pt reporting 95-100% on other items                                                                                                                               TREATMENT DATE: 03/01/2024  TE- To improve strength, endurance, mobility, and function of specific targeted muscle groups or improve joint range of motion or improve muscle flexibility  Bridge with march  2 x 15  Side plank on peanut ball sidelying on plinth 6 x 7  sec ea side, pt repots higher difficulty  SL  hip abduction on R LE 2 x 10 with min A for lifting leg 2 x 10, no pain or popping noted this date   Wall bird dog 2 x 15, min A to maintain hip position with R LE SLS phase  - added step to R LE to allow proper form   Leg press with R LE eccentric SL phase - 40 lb, assistance with placing LLE back on plate each round to prevent back discomfort. 2 * 12 reps  TA- To improve functional movements patterns for everyday tasks   Standing sidestepping 2x12 ea with YTB around mid shank   Standing R LE side step up to step training with UE assist 2 x 10   Gait as  speed slower than normal with focus on avoiding scissoring gait x 120 ft with ext cue of tiles on floor to avoid scissoring gait, min A at hips to prevent trendelenburg gait severity.     PATIENT EDUCATION: Education details: Pt educated throughout session about proper posture and technique with exercises. Improved exercise technique, movement at target joints, use of target muscles after min to mod verbal, visual, tactile cues. Person educated: Patient Education method: Explanation and Handouts Education comprehension: verbalized understanding and needs further education  HOME EXERCISE PROGRAM:  Access Code: [email or web address] URL: [email or web address] Date: 02/09/2024 Prepared by: Lonni Gainer  Exercises - Supine Bridge with Resistance Band  - 1 x daily - 7 x weekly - 2 sets - 15 reps - Marching Bridge  - 1 x daily - 7 x weekly - 2 sets - 10 reps - Supine Hip Adduction Isometric with Ball  - 1 x daily - 7 x weekly - 2 sets - 10 reps - 3-5 sec  hold - Standing Side Plank on Wall  - 1 x daily - 7 x weekly - 3 sets - 30 second  hold - Side Stepping with Resistance at Thighs and Counter Support  - 1 x daily - 7 x weekly - 2-3 sets - 10 reps - Modified Bird Dog At Wall  - 1 x daily - 7 x weekly - 2 sets - 10 reps - Lateral Step Up with Counter Support  - 1 x daily - 7 x weekly - 2 sets - 10 reps   GOALS: Goals reviewed with patient? Yes  SHORT TERM GOALS: Target date: 01/26/2024   Patient will be independent in home exercise program to improve strength/mobility for better functional independence with ADLs.  Baseline: 12/15/2023 initial HEP provided 6/17: pt completing regularly Goal status: MET   LONG TERM GOALS: Target date: 03/08/2024  Patient will increase Berg Balance score to > 51/56 to demonstrate improved balance and decreased fall risk during functional activities and ADLs.  Baseline: 55 Goal status: MET  2.  Patient will increase six minute walk test distance to  >1091ft for progression to community ambulator and improve gait ability  Baseline: 1080 ft  Goal status: MET  3.  Patient will increase Functional Gait Assessment (FGA) score to >20/30 as to reduce fall risk and improve dynamic gait safety with community ambulation.  Baseline: 23 Goal status: MET  4.  Patient will maintain 30 seconds of single leg stance on each LE without loss of balance indicating increased hip strength and improved ability to ambulate and navigate stairs without trendelenburg pattern. Baseline: L LE: 5 seconds, R LE: 0 seconds 6/17: 17 sec, 3-4 sec Goal status: ONGOING  5.  Patient will increase BLE gross strength to 4+/5 as to  improve functional strength for independent gait, increased standing tolerance and increased ADL ability.  Baseline: see above Goal status: ONGOING  7.  Patient will improve lower extremity functional scale by 10 points or greater in order to indicate improved function as it relates to her lower extremity weakness. Baseline: 50 Goal status: INITIAL  8.  Patient will report and demonstrate little to no difficulty when ascending 10 stairs or more in order to improve her safety and confidence with community ambulation and accessing her environment. Baseline: moderate difficulty Goal status: INITIAL     ASSESSMENT:  CLINICAL IMPRESSION:  Continued with current plan of care as laid out in evaluation and recent prior sessions. Pt remains motivated to advance progress toward goals in order to maximize independence and safety at home. Pt requires high level assistance and cuing for completion of exercises in order to provide adequate level of stimulation challenge while minimizing pain and discomfort when possible.  Pt closely monitored throughout session pt response and to maximize patient safety during interventions. Continued with closed chain hip abductor strength with SL on peanut p ball to off weight shoulders with good results and progressed  time this date with good response, will continue to progress in future visits. Pt gait showing improvement but still has very notable trendelenburg gait. Pt continues to demonstrate progress toward goals AEB progression of interventions this date either in volume or intensity. Pt will continue to benefit from skilled physical therapy intervention to address impairments, improve QOL, and attain therapy goals.      OBJECTIVE IMPAIRMENTS: Abnormal gait, decreased activity tolerance, decreased balance, decreased endurance, decreased knowledge of condition, decreased mobility, difficulty walking, decreased strength, improper body mechanics, and pain.   ACTIVITY LIMITATIONS: carrying, lifting, bending, standing, squatting, stairs, transfers, dressing, locomotion level, and caring for others  PARTICIPATION LIMITATIONS: cleaning, laundry, shopping, and community activity  PERSONAL FACTORS: Age, Sex, Time since onset of injury/illness/exacerbation, and 3+ comorbidities: Rheumatoid arthritis, obesity, prediabetes, HTN are also affecting patient's functional outcome.   REHAB POTENTIAL: Good  CLINICAL DECISION MAKING: Stable/uncomplicated  EVALUATION COMPLEXITY: Low  PLAN:  PT FREQUENCY: 1-2x/week  PT DURATION: 12 weeks  PLANNED INTERVENTIONS: 97164- PT Re-evaluation, 97750- Physical Performance Testing, 97110-Therapeutic exercises, 97530- Therapeutic activity, [id number]- Neuromuscular re-education, 97535- Self Care, [id number]- Manual therapy, [id number]- Gait training, [phone]- Orthotic Initial, [phone]- Orthotic/Prosthetic subsequent, [phone]- Canalith repositioning, [phone]- Electrical stimulation (manual), Patient/Family education, Balance training, Stair training, Taping, Dry Needling, Joint mobilization, Joint manipulation, Spinal mobilization, Vestibular training, DME instructions, Cryotherapy, Moist heat, and Biofeedback  PLAN FOR NEXT SESSION:  - Need to assess spine curvature in more detail - focus on hip  strengthening, specifically R hip abductors to decrease trendelenburg during gait -progress with static balance, particularly on R LE    Note: Portions of this document were prepared using Dragon voice recognition software and although reviewed may contain unintentional dictation errors in syntax, grammar, or spelling.  Lonni KATHEE Gainer PT ,DPT Physical Therapist- [facility]   1:13 PM 03/01/24

## 2024-03-06 ENCOUNTER — Ambulatory Visit: Payer: PRIVATE HEALTH INSURANCE | Admitting: Physical Therapy

## 2024-03-06 ENCOUNTER — Encounter: Payer: Self-pay | Admitting: Family Medicine

## 2024-03-06 DIAGNOSIS — R2681 Unsteadiness on feet: Secondary | ICD-10-CM

## 2024-03-06 DIAGNOSIS — R531 Weakness: Secondary | ICD-10-CM

## 2024-03-06 DIAGNOSIS — R269 Unspecified abnormalities of gait and mobility: Secondary | ICD-10-CM

## 2024-03-06 NOTE — Therapy (Signed)
 OUTPATIENT PHYSICAL THERAPY NEURO TREATMENT   Patient Name: Rita Bailey MRN: [id number] DOB:[birth year], 73 y.o., female Today's Date: 03/06/2024   PCP: Myrla Jon HERO, MD REFERRING PROVIDER: Myrla Jon HERO, MD   END OF SESSION:   PT End of Session - 03/06/24 1437     Visit Number 15    Number of Visits 24    Date for PT Re-Evaluation 03/27/24    Progress Note Due on Visit 20    PT Start Time 1445    PT Stop Time 1527    PT Time Calculation (min) 42 min    Equipment Utilized During Treatment Gait belt    Activity Tolerance Patient tolerated treatment well    Behavior During Therapy WFL for tasks assessed/performed                Past Medical History:  Diagnosis Date   Allergy    Arthritis    Hypertension    Scoliosis of lumbosacral spine    Past Surgical History:  Procedure Laterality Date   CESAREAN SECTION     COLONOSCOPY WITH PROPOFOL  N/A 05/11/2017   Procedure: COLONOSCOPY WITH PROPOFOL ;  Surgeon: Dessa Reyes ORN, MD;  Location: [facility];  Service: Endoscopy;  Laterality: N/A;   COLONOSCOPY WITH PROPOFOL  N/A 12/16/2021   Procedure: COLONOSCOPY WITH PROPOFOL ;  Surgeon: Dessa Reyes ORN, MD;  Location: [facility];  Service: Endoscopy;  Laterality: N/A;   DILATATION & CURETTAGE/HYSTEROSCOPY WITH MYOSURE N/A 02/27/2016   Procedure: DILATATION & CURETTAGE/HYSTEROSCOPY WITH MYOSURE;  Surgeon: Debby JINNY Dinsmore, MD;  Location: [facility];  Service: Gynecology;  Laterality: N/A;   ESOPHAGOGASTRODUODENOSCOPY (EGD) WITH PROPOFOL  N/A 05/11/2017   Procedure: ESOPHAGOGASTRODUODENOSCOPY (EGD) WITH PROPOFOL ;  Surgeon: Dessa Reyes ORN, MD;  Location: [facility];  Service: Endoscopy;  Laterality: N/A;   sebaceous syst removed  08/24/2011   tonsillectomy     TONSILLECTOMY     Patient Active Problem List   Diagnosis Date Noted   Rheumatoid arthritis (HCC) 05/30/2023   Bilateral hand swelling 12/28/2022   Eczema 04/13/2018   Irritable  bowel syndrome with diarrhea 04/13/2018   Prediabetes 04/13/2018   PMB (postmenopausal bleeding) 10/05/2017   Gastro-esophageal reflux disease with esophagitis 05/13/2017   Strain of muscle of right hip 05/06/2017   Bilateral hearing loss 04/12/2017   Scoliosis of lumbosacral spine 03/30/2017   Abnormality of gait and mobility 03/30/2017   Allergic rhinitis 09/08/2015   Hypertension 09/08/2015   Gonalgia 09/08/2015   RAD (reactive airway disease) 09/08/2015   Obesity 03/12/2015   Current tear of meniscus 01/02/2015   Tear of meniscus of knee 01/02/2015    ONSET DATE: Years ago  REFERRING DIAG: R26.9 (ICD-10-CM) - Abnormal gait   THERAPY DIAG:  Generalized weakness  Unsteadiness on feet  Abnormality of gait  Rationale for Evaluation and Treatment: Rehabilitation  SUBJECTIVE:  SUBJECTIVE STATEMENT:  Pt reports feeling like she is continuing to improve with her walking. Still busy with management of her family member's doctor's appointment and obligations.   From eval:  Patient reports she had previously went to [facility] to see Ortho and was referred for physical therapy, but due to logistical errors she did not start PT at that time.  Pt states these gait deviations started years ago. Reports no injury at the time they started. Pt states she has always walked a lot for exercise, but it is hard for her to do now. Pt states she has noticed her gait deviations because she has started getting L thigh pain, but otherwise does not have pain. Pt states these deviations impact her balance.  States she has difficulty walking slow because her gait deviations are more pronounced.   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 mother, and immobile  husband due to him needing a hip replacement.  Pt accompanied by: self  PERTINENT HISTORY: PMH: Rheumatoid arthritis, obesity, prediabetes, HTN  Per MD note on 12/13/2023: Trendelenburg gait due to hip weakness Trendelenburg gait due to hip weakness identified. Previous recommendation for physical therapy was not followed through due to logistical issues. - Refer to physical therapy for hip strengthening exercises  PAIN:  Are you having pain? None related to hip weakness, but will have pain in hands when overusing them due to RA  PRECAUTIONS: None  RED FLAGS: None   WEIGHT BEARING RESTRICTIONS: No  FALLS: Has patient fallen in last 6 months? Yes. Number of falls 1x tripped backwards over her cat  LIVING ENVIRONMENT: Lives with: lives with their spouse and lives with their son (husband and special needs son) Lives in: House/apartment - 1 level Stairs: Yes: External: 1 steps; on left going up Has following equipment at home: shower chair and Grab bars  PLOF: Independent, Independent with household mobility without device, and Independent with homemaking with ambulation  PATIENT GOALS: be able to walk with a normal gait  OBJECTIVE:  Note: Objective measures were completed at Evaluation unless otherwise noted.  DIAGNOSTIC FINDINGS: N/A  COGNITION: Overall cognitive status: Within functional limits for tasks assessed   SENSATION: WFL  COORDINATION: Not formally assessed  EDEMA:  Not formally assessed, but none observed  MUSCLE TONE: appears WNL  MUSCLE LENGTH: Not formally assessed During supine testing, it is possible L LE is slightly longer than R LE, but didn't formally measure  DTRs:  Not formally assessed  POSTURE: rounded shoulders and forward head Need to assess spine curvature in more detail  LOWER EXTREMITY ROM:     Active   WFL/WNL throughout Right Eval Left Eval  Hip flexion    Hip extension    Hip abduction    Hip adduction    Hip  internal rotation    Hip external rotation    Knee flexion    Knee extension    Ankle dorsiflexion    Ankle plantarflexion    Ankle inversion    Ankle eversion     (Blank rows = not tested)  LOWER EXTREMITY MMT:    MMT Right Eval Left Eval Right 6/17 Left 6/17  Hip flexion 4- 4+ 4   Hip extension      Hip abduction 2+  Tested in sidelying 4- Tested in sidelying 3- 4   Hip adduction      Hip internal rotation      Hip  external rotation 3+ 4 4   Knee flexion 5 5    Knee extension 5 5    Ankle dorsiflexion 5 5    Ankle plantarflexion 5 5    Ankle inversion      Ankle eversion      (Blank rows = not tested)  Manual Muscle Test Scale 0/5 = No muscle contraction can be seen or felt 1/5 = Contraction can be felt, but there is no motion 2-/5 = Part moves through incomplete ROM w/ gravity decreased 2/5 = Part moves through complete ROM w/ gravity decreased 2+/5 = Part moves through incomplete ROM (<50%) against gravity or through complete ROM w/ gravity 3-/5 = Part moves through incomplete ROM (>50%) against gravity 3/5 = Part moves through complete ROM against gravity 3+/5 = Part moves through complete ROM against gravity/slight resistance 4-/5= Holds test position against slight to moderate pressure 4/5 = Part moves through complete ROM against gravity/moderate resistance 4+/5= Holds test position against moderate to strong pressure 5/5 = Part moves through complete ROM against gravity/full resistance  BED MOBILITY:  Findings: Sit to supine Modified independence Supine to sit Modified independence  TRANSFERS: Sit to stand: Complete Independence  Assistive device utilized: None     Stand to sit: Complete Independence  Assistive device utilized: None     Chair to chair: Complete Independence  Assistive device utilized: None       RAMP:  Not tested  CURB:  Not tested  STAIRS: Findings: Level of Assistance: Modified independence, Stair Negotiation Technique:  Alternating Pattern  Forwards with Single Rail on Right Single Rail on Left Bilateral Rails, Number of Stairs: 4, Height of Stairs: 6in   , and Comments: pt varying in railings used, but reciprocal pattern throughout GAIT: Findings: Gait Characteristics: trendelenburg and lateral lean- Left, Distance walked: 116ft, Assistive device utilized:None, Level of assistance: Complete Independence, and Comments: L lateral trunk lean during every R stance phase due to R hip weakness and possible leg length discrepancy     FUNCTIONAL TESTS:  5 times sit to stand: 12.97 seconds 6 minute walk test: need to assess 10 meter walk test: 1.79m/s without AD Berg Balance Scale: need to assess Functional gait assessment: need to assess  PATIENT SURVEYS:  ABC scale 94.375% with pt reporting least confidence standing on chair to reach for something, stepping on/off escalator without holding on, and walking on ice - otherwise pt reporting 95-100% on other items                                                                                                                               TREATMENT DATE: 03/06/2024  TE- To improve strength, endurance, mobility, and function of specific targeted muscle groups or improve joint range of motion or improve muscle flexibility  Bridge with march  1 x 15  -1 sec holds on second set of 15 to add in hip extension stability endurance  R side  Side plank on knees on peanut ball sidelying on plinth 6 x 10  sec ea side, pt repots higher difficulty  -peanut ball on upper torso medial to UE to eliminate stress on R GH joint   Qped - knees on airex pad, pain noted and not tolerable position  - Alternating LE hip and knee extension x 5 reps  Standing bird dog wall modification 2 x 10 ea LE with 3# AW donned to increase gluteal activation and challenge  - added step to R LE to allow proper form    Leg press with R LE eccentric SL phase - 40 lb, assistance with placing LLE back  on plate each rep to prevent back discomfort. 2 * 12 reps  TA- To improve functional movements patterns for everyday tasks   Standing sidestepping 2x12 ea with GTB around ankles with knees slightly bent   NMR: To facilitate reeducation of movement, balance, posture, coordination, and/or proprioception/kinesthetic sense.  1/2 romberg stance on airex pad with RLE posterior to challenge hip and ankle stabilizers 2 x 30 sec    PATIENT EDUCATION: Education details: Pt educated throughout session about proper posture and technique with exercises. Improved exercise technique, movement at target joints, use of target muscles after min to mod verbal, visual, tactile cues. Person educated: Patient Education method: Explanation and Handouts Education comprehension: verbalized understanding and needs further education  HOME EXERCISE PROGRAM:  Access Code: [email or web address] URL: [email or web address] Date: 02/09/2024 Prepared by: Lonni Gainer  Exercises - Supine Bridge with Resistance Band  - 1 x daily - 7 x weekly - 2 sets - 15 reps - Marching Bridge  - 1 x daily - 7 x weekly - 2 sets - 10 reps - Supine Hip Adduction Isometric with Ball  - 1 x daily - 7 x weekly - 2 sets - 10 reps - 3-5 sec  hold - Standing Side Plank on Wall  - 1 x daily - 7 x weekly - 3 sets - 30 second  hold - Side Stepping with Resistance at Thighs and Counter Support  - 1 x daily - 7 x weekly - 2-3 sets - 10 reps - Modified Bird Dog At Wall  - 1 x daily - 7 x weekly - 2 sets - 10 reps - Lateral Step Up with Counter Support  - 1 x daily - 7 x weekly - 2 sets - 10 reps   GOALS: Goals reviewed with patient? Yes  SHORT TERM GOALS: Target date: 01/26/2024   Patient will be independent in home exercise program to improve strength/mobility for better functional independence with ADLs.  Baseline: 12/15/2023 initial HEP provided 6/17: pt completing regularly Goal status: MET   LONG TERM GOALS: Target date:  03/08/2024  Patient will increase Berg Balance score to > 51/56 to demonstrate improved balance and decreased fall risk during functional activities and ADLs.  Baseline: 55 Goal status: MET  2.  Patient will increase six minute walk test distance to >1020ft for progression to community ambulator and improve gait ability  Baseline: 1080 ft  Goal status: MET  3.  Patient will increase Functional Gait Assessment (FGA) score to >20/30 as to reduce fall risk and improve dynamic gait safety with community ambulation.  Baseline: 23 Goal status: MET  4.  Patient will maintain 30 seconds of single leg stance on each LE without loss of balance indicating increased hip strength and improved ability to ambulate and navigate stairs without trendelenburg pattern. Baseline: L LE: 5 seconds, R LE:  0 seconds 6/17: 17 sec, 3-4 sec Goal status: ONGOING  5.  Patient will increase BLE gross strength to 4+/5 as to improve functional strength for independent gait, increased standing tolerance and increased ADL ability.  Baseline: see above Goal status: ONGOING  7.  Patient will improve lower extremity functional scale by 10 points or greater in order to indicate improved function as it relates to her lower extremity weakness. Baseline: 50 Goal status: INITIAL  8.  Patient will report and demonstrate little to no difficulty when ascending 10 stairs or more in order to improve her safety and confidence with community ambulation and accessing her environment. Baseline: moderate difficulty Goal status: INITIAL     ASSESSMENT:  CLINICAL IMPRESSION:  Continued with current plan of care as laid out in evaluation and recent prior sessions. Pt remains motivated to advance progress toward goals in order to maximize independence and safety at home. Pt requires high level assistance and cuing for completion of exercises in order to provide adequate level of stimulation challenge while minimizing pain and discomfort  when possible.  Pt closely monitored throughout session pt response and to maximize patient safety during interventions. Continued with closed chain hip abductor strength with SL on peanut p ball to off weight shoulders with good results and progressed time this date with good response, will continue to progress time and or reps in future visits. Pt had discomfort in her R knee with Q ped position despite being on mat table and with airex pad under knees so this position is not recommended in future sessions. Will likely progress bird dog in standing and with ankle weights as an alternative. Pt will continue to benefit from skilled physical therapy intervention to address impairments, improve QOL, and attain therapy goals.      OBJECTIVE IMPAIRMENTS: Abnormal gait, decreased activity tolerance, decreased balance, decreased endurance, decreased knowledge of condition, decreased mobility, difficulty walking, decreased strength, improper body mechanics, and pain.   ACTIVITY LIMITATIONS: carrying, lifting, bending, standing, squatting, stairs, transfers, dressing, locomotion level, and caring for others  PARTICIPATION LIMITATIONS: cleaning, laundry, shopping, and community activity  PERSONAL FACTORS: Age, Sex, Time since onset of injury/illness/exacerbation, and 3+ comorbidities: Rheumatoid arthritis, obesity, prediabetes, HTN are also affecting patient's functional outcome.   REHAB POTENTIAL: Good  CLINICAL DECISION MAKING: Stable/uncomplicated  EVALUATION COMPLEXITY: Low  PLAN:  PT FREQUENCY: 1-2x/week  PT DURATION: 12 weeks  PLANNED INTERVENTIONS: [id number]- PT Re-evaluation, 97750- Physical Performance Testing, 97110-Therapeutic exercises, 97530- Therapeutic activity, [id number]- Neuromuscular re-education, 97535- Self Care, [id number]- Manual therapy, [id number]- Gait training, [phone]- Orthotic Initial, [phone]- Orthotic/Prosthetic subsequent, [phone]- Canalith repositioning, [phone]- Electrical stimulation  (manual), Patient/Family education, Balance training, Stair training, Taping, Dry Needling, Joint mobilization, Joint manipulation, Spinal mobilization, Vestibular training, DME instructions, Cryotherapy, Moist heat, and Biofeedback  PLAN FOR NEXT SESSION:  - focus on hip strengthening, specifically R hip abductors to decrease trendelenburg during gait -progress with static balance, particularly on R LE  No Q ped position    Note: Portions of this document were prepared using Dragon voice recognition software and although reviewed may contain unintentional dictation errors in syntax, grammar, or spelling.  Lonni KATHEE Gainer PT ,DPT Physical Therapist- [place]  [facility]   2:37 PM 03/06/24

## 2024-03-08 ENCOUNTER — Ambulatory Visit: Payer: PRIVATE HEALTH INSURANCE | Admitting: Physical Therapy

## 2024-03-08 DIAGNOSIS — R531 Weakness: Secondary | ICD-10-CM | POA: Diagnosis not present

## 2024-03-08 DIAGNOSIS — R269 Unspecified abnormalities of gait and mobility: Secondary | ICD-10-CM

## 2024-03-08 DIAGNOSIS — R2681 Unsteadiness on feet: Secondary | ICD-10-CM

## 2024-03-08 NOTE — Therapy (Signed)
 OUTPATIENT PHYSICAL THERAPY NEURO TREATMENT   Patient Name: Rita Bailey MRN: [id number] DOB:[birth year], 73 y.o., female Today's Date: 03/08/2024   PCP: Myrla Jon HERO, MD REFERRING PROVIDER: Myrla Jon HERO, MD   END OF SESSION:   PT End of Session - 03/08/24 1023     Visit Number 16    Number of Visits 24    Date for PT Re-Evaluation 03/27/24    Progress Note Due on Visit 20    PT Start Time 1023    PT Stop Time 1103    PT Time Calculation (min) 40 min    Equipment Utilized During Treatment Gait belt    Activity Tolerance Patient tolerated treatment well    Behavior During Therapy WFL for tasks assessed/performed            Past Medical History:  Diagnosis Date   Allergy    Arthritis    Hypertension    Scoliosis of lumbosacral spine    Past Surgical History:  Procedure Laterality Date   CESAREAN SECTION     COLONOSCOPY WITH PROPOFOL  N/A 05/11/2017   Procedure: COLONOSCOPY WITH PROPOFOL ;  Surgeon: Dessa Reyes ORN, MD;  Location: [facility];  Service: Endoscopy;  Laterality: N/A;   COLONOSCOPY WITH PROPOFOL  N/A 12/16/2021   Procedure: COLONOSCOPY WITH PROPOFOL ;  Surgeon: Dessa Reyes ORN, MD;  Location: [facility];  Service: Endoscopy;  Laterality: N/A;   DILATATION & CURETTAGE/HYSTEROSCOPY WITH MYOSURE N/A 02/27/2016   Procedure: DILATATION & CURETTAGE/HYSTEROSCOPY WITH MYOSURE;  Surgeon: Debby JINNY Dinsmore, MD;  Location: [facility];  Service: Gynecology;  Laterality: N/A;   ESOPHAGOGASTRODUODENOSCOPY (EGD) WITH PROPOFOL  N/A 05/11/2017   Procedure: ESOPHAGOGASTRODUODENOSCOPY (EGD) WITH PROPOFOL ;  Surgeon: Dessa Reyes ORN, MD;  Location: [facility];  Service: Endoscopy;  Laterality: N/A;   sebaceous syst removed  08/24/2011   tonsillectomy     TONSILLECTOMY     Patient Active Problem List   Diagnosis Date Noted   Rheumatoid arthritis (HCC) 05/30/2023   Bilateral hand swelling 12/28/2022   Eczema 04/13/2018   Irritable bowel  syndrome with diarrhea 04/13/2018   Prediabetes 04/13/2018   PMB (postmenopausal bleeding) 10/05/2017   Gastro-esophageal reflux disease with esophagitis 05/13/2017   Strain of muscle of right hip 05/06/2017   Bilateral hearing loss 04/12/2017   Scoliosis of lumbosacral spine 03/30/2017   Abnormality of gait and mobility 03/30/2017   Allergic rhinitis 09/08/2015   Hypertension 09/08/2015   Gonalgia 09/08/2015   RAD (reactive airway disease) 09/08/2015   Obesity 03/12/2015   Current tear of meniscus 01/02/2015   Tear of meniscus of knee 01/02/2015    ONSET DATE: Years ago  REFERRING DIAG: R26.9 (ICD-10-CM) - Abnormal gait   THERAPY DIAG:  Generalized weakness  Unsteadiness on feet  Abnormality of gait  Rationale for Evaluation and Treatment: Rehabilitation  SUBJECTIVE:  SUBJECTIVE STATEMENT:  Pt reports she is having increased R knee pain and swelling since trying to perform quadruped exercise during last therapy session. Pt states coming down the steps this morning also exacerbated it. Rates ache type of pain as 2/10 currently, but states last night it was 10/10 but she was able to support it in a way for her to rest. Pt states she almost cancelled her appointment today, but decided to come and do what she can. Pt states pain occurs with flexion.  Pt reports she is feeling really sore still from Tuesday's therapy session.   From eval:  Patient reports she had previously went to [facility] to see Ortho and was referred for physical therapy, but due to logistical errors she did not start PT at that time.  Pt states these gait deviations started years ago. Reports no injury at the time they started. Pt states she has always walked a lot for exercise, but it is hard for her to do now. Pt  states she has noticed her gait deviations because she has started getting L thigh pain, but otherwise does not have pain. Pt states these deviations impact her balance.  States she has difficulty walking slow because her gait deviations are more pronounced.   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 mother, and immobile husband due to him needing a hip replacement.  Pt accompanied by: self  PERTINENT HISTORY: PMH: Rheumatoid arthritis, obesity, prediabetes, HTN  Per MD note on 12/13/2023: Trendelenburg gait due to hip weakness Trendelenburg gait due to hip weakness identified. Previous recommendation for physical therapy was not followed through due to logistical issues. - Refer to physical therapy for hip strengthening exercises  PAIN:  Are you having pain? None related to hip weakness, but will have pain in hands when overusing them due to RA  PRECAUTIONS: None  RED FLAGS: None   WEIGHT BEARING RESTRICTIONS: No  FALLS: Has patient fallen in last 6 months? Yes. Number of falls 1x tripped backwards over her cat  LIVING ENVIRONMENT: Lives with: lives with their spouse and lives with their son (husband and special needs son) Lives in: House/apartment - 1 level Stairs: Yes: External: 1 steps; on left going up Has following equipment at home: shower chair and Grab bars  PLOF: Independent, Independent with household mobility without device, and Independent with homemaking with ambulation  PATIENT GOALS: be able to walk with a normal gait  OBJECTIVE:  Note: Objective measures were completed at Evaluation unless otherwise noted.  DIAGNOSTIC FINDINGS: N/A  COGNITION: Overall cognitive status: Within functional limits for tasks assessed   SENSATION: WFL  COORDINATION: Not formally assessed  EDEMA:  Not formally assessed, but none observed  MUSCLE TONE: appears WNL  MUSCLE LENGTH: Not formally assessed During  supine testing, it is possible L LE is slightly longer than R LE, but didn't formally measure  DTRs:  Not formally assessed  POSTURE: rounded shoulders and forward head Need to assess spine curvature in more detail  LOWER EXTREMITY ROM:     Active   WFL/WNL throughout Right Eval Left Eval  Hip flexion    Hip extension    Hip abduction    Hip adduction    Hip internal rotation    Hip external rotation    Knee flexion    Knee extension    Ankle dorsiflexion    Ankle plantarflexion    Ankle inversion  Ankle eversion     (Blank rows = not tested)  LOWER EXTREMITY MMT:    MMT Right Eval Left Eval Right 6/17 Left 6/17  Hip flexion 4- 4+ 4   Hip extension      Hip abduction 2+  Tested in sidelying 4- Tested in sidelying 3- 4   Hip adduction      Hip internal rotation      Hip external rotation 3+ 4 4   Knee flexion 5 5    Knee extension 5 5    Ankle dorsiflexion 5 5    Ankle plantarflexion 5 5    Ankle inversion      Ankle eversion      (Blank rows = not tested)  Manual Muscle Test Scale 0/5 = No muscle contraction can be seen or felt 1/5 = Contraction can be felt, but there is no motion 2-/5 = Part moves through incomplete ROM w/ gravity decreased 2/5 = Part moves through complete ROM w/ gravity decreased 2+/5 = Part moves through incomplete ROM (<50%) against gravity or through complete ROM w/ gravity 3-/5 = Part moves through incomplete ROM (>50%) against gravity 3/5 = Part moves through complete ROM against gravity 3+/5 = Part moves through complete ROM against gravity/slight resistance 4-/5= Holds test position against slight to moderate pressure 4/5 = Part moves through complete ROM against gravity/moderate resistance 4+/5= Holds test position against moderate to strong pressure 5/5 = Part moves through complete ROM against gravity/full resistance  BED MOBILITY:  Findings: Sit to supine Modified independence Supine to sit Modified  independence  TRANSFERS: Sit to stand: Complete Independence  Assistive device utilized: None     Stand to sit: Complete Independence  Assistive device utilized: None     Chair to chair: Complete Independence  Assistive device utilized: None       RAMP:  Not tested  CURB:  Not tested  STAIRS: Findings: Level of Assistance: Modified independence, Stair Negotiation Technique: Alternating Pattern  Forwards with Single Rail on Right Single Rail on Left Bilateral Rails, Number of Stairs: 4, Height of Stairs: 6in   , and Comments: pt varying in railings used, but reciprocal pattern throughout GAIT: Findings: Gait Characteristics: trendelenburg and lateral lean- Left, Distance walked: 155ft, Assistive device utilized:None, Level of assistance: Complete Independence, and Comments: L lateral trunk lean during every R stance phase due to R hip weakness and possible leg length discrepancy     FUNCTIONAL TESTS:  5 times sit to stand: 12.97 seconds 6 minute walk test: need to assess 10 meter walk test: 1.37m/s without AD Berg Balance Scale: need to assess Functional gait assessment: need to assess  PATIENT SURVEYS:  ABC scale 94.375% with pt reporting least confidence standing on chair to reach for something, stepping on/off escalator without holding on, and walking on ice - otherwise pt reporting 95-100% on other items  TREATMENT DATE: 03/08/2024  Beginning of therapy session spent focusing on R knee pain management to allow increased participation in hip strengthening exercises.  Supine hooklying R knee posterior to anterior tibial grade 2 mobilizations 3x 30 sec Supine R LE PROM knee flexion/extension x10reps for pain modulation  Sitting knee flexion during posterior to anterior tibial grade 2 mobilizations 3x 30sec  Seated R LE long arc quad AROM x15 reps    Gait ~30ft, no AD, independently with pt reporting improvement in her R knee pain.   L sidelying R LE modified clamshell with LE starting in hip internal rotation 2x6 reps Pt reports really feeling it in her R hip abductors/external rotators Would be a good exercise to continue  Supine bridge x10reps Cuing for exhale on exertion to activate deep core Added RTB resistance around knees for increased hip abductor activation x15reps Bridge with march x10reps each with 1 sec hold with each lift Pt demonstrates significant improvement in R hip stability while lifting L LE without R knee adduction and hip internal rotation compared to last time seen by this therapist!   Standing with B UE support and mirror feedback R LE fire hydrants with RTB resistance around thighs 2x6reps  Sumo squats with B LEs externally rotated 2x10reps in // bars for support as needed    PATIENT EDUCATION: Education details: Pt educated throughout session about proper posture and technique with exercises. Improved exercise technique, movement at target joints, use of target muscles after min to mod verbal, visual, tactile cues. Person educated: Patient Education method: Explanation and Handouts Education comprehension: verbalized understanding and needs further education  HOME EXERCISE PROGRAM:  Access Code: [email or web address] URL: [email or web address] Date: 02/09/2024 Prepared by: Lonni Gainer  Exercises - Supine Bridge with Resistance Band  - 1 x daily - 7 x weekly - 2 sets - 15 reps - Marching Bridge  - 1 x daily - 7 x weekly - 2 sets - 10 reps - Supine Hip Adduction Isometric with Ball  - 1 x daily - 7 x weekly - 2 sets - 10 reps - 3-5 sec  hold - Standing Side Plank on Wall  - 1 x daily - 7 x weekly - 3 sets - 30 second  hold - Side Stepping with Resistance at Thighs and Counter Support  - 1 x daily - 7 x weekly - 2-3 sets - 10 reps - Modified Bird Dog At Wall  - 1 x daily - 7 x weekly - 2  sets - 10 reps - Lateral Step Up with Counter Support  - 1 x daily - 7 x weekly - 2 sets - 10 reps   GOALS: Goals reviewed with patient? Yes  SHORT TERM GOALS: Target date: 03/08/2024   Patient will be independent in home exercise program to improve strength/mobility for better functional independence with ADLs.  Baseline: 12/15/2023 initial HEP provided 6/17: pt completing regularly Goal status: MET   LONG TERM GOALS: Target date: 03/27/2024  Patient will increase Berg Balance score to > 51/56 to demonstrate improved balance and decreased fall risk during functional activities and ADLs.  Baseline: 55 Goal status: MET  2.  Patient will increase six minute walk test distance to >1038ft for progression to community ambulator and improve gait ability  Baseline: 1080 ft  Goal status: MET  3.  Patient will increase Functional Gait Assessment (FGA) score to >20/30 as to reduce fall risk and improve dynamic gait safety with community ambulation.  Baseline: 23 Goal status: MET  4.  Patient will maintain 30 seconds of single leg stance on each LE without loss of balance indicating increased hip strength and improved ability to ambulate and navigate stairs without trendelenburg pattern. Baseline: L LE: 5 seconds, R LE: 0 seconds 6/17: 17 sec, 3-4 sec Goal status: ONGOING  5.  Patient will increase BLE gross strength to 4+/5 as to improve functional strength for independent gait, increased standing tolerance and increased ADL ability.  Baseline: see above Goal status: ONGOING  7.  Patient will improve lower extremity functional scale by 10 points or greater in order to indicate improved function as it relates to her lower extremity weakness. Baseline: 50 Goal status: INITIAL  8.  Patient will report and demonstrate little to no difficulty when ascending 10 stairs or more in order to improve her safety and confidence with community ambulation and accessing her environment. Baseline:  moderate difficulty Goal status: INITIAL     ASSESSMENT:  CLINICAL IMPRESSION:  Continued with current plan of care as laid out in evaluation and recent prior sessions. Pt remains motivated to advance progress towards goals in order to maximize independence and safety at home. Pt requires high level cuing for completion of exercises in order to provide adequate level of stimulation challenge while minimizing pain and discomfort when possible. Started session focusing on management of R knee pain to allow increased participation in therapy session with pt reporting improvement, resulting in increased ability to participate in standing and weightbearing strengthening exercises. Introduced 2 additional open chain R LE hip abductor and external rotation strengthening exercise today since pt reporting still having significant soreness from last therapy session. Patient continues to demo improvement in R hip strength as noted by significant improvement in stability during bridges with marching. Pt will continue to benefit from skilled physical therapy intervention to address impairments, improve QOL, and attain therapy goals.      OBJECTIVE IMPAIRMENTS: Abnormal gait, decreased activity tolerance, decreased balance, decreased endurance, decreased knowledge of condition, decreased mobility, difficulty walking, decreased strength, improper body mechanics, and pain.   ACTIVITY LIMITATIONS: carrying, lifting, bending, standing, squatting, stairs, transfers, dressing, locomotion level, and caring for others  PARTICIPATION LIMITATIONS: cleaning, laundry, shopping, and community activity  PERSONAL FACTORS: Age, Sex, Time since onset of injury/illness/exacerbation, and 3+ comorbidities: Rheumatoid arthritis, obesity, prediabetes, HTN are also affecting patient's functional outcome.   REHAB POTENTIAL: Good  CLINICAL DECISION MAKING: Stable/uncomplicated  EVALUATION COMPLEXITY: Low  PLAN:  PT  FREQUENCY: 1-2x/week  PT DURATION: 12 weeks  PLANNED INTERVENTIONS: [id number]- PT Re-evaluation, 97750- Physical Performance Testing, 97110-Therapeutic exercises, 97530- Therapeutic activity, [id number]- Neuromuscular re-education, 97535- Self Care, [id number]- Manual therapy, [id number]- Gait training, [phone]- Orthotic Initial, [phone]- Orthotic/Prosthetic subsequent, [phone]- Canalith repositioning, [phone]- Electrical stimulation (manual), Patient/Family education, Balance training, Stair training, Taping, Dry Needling, Joint mobilization, Joint manipulation, Spinal mobilization, Vestibular training, DME instructions, Cryotherapy, Moist heat, and Biofeedback  PLAN FOR NEXT SESSION:  - focus on hip strengthening, specifically R hip abductors to decrease trendelenburg during gait -progress with static balance, particularly on R LE  No Q ped position      Rylie Limburg, PT, DPT, NCS, CSRS Physical Therapist - [facility]  [facility]  11:05 AM 03/08/24

## 2024-03-09 NOTE — Therapy (Deleted)
 OUTPATIENT PHYSICAL THERAPY NEURO TREATMENT   Patient Name: Rita Bailey MRN: [id number] DOB:[birth year], 73 y.o., female Today's Date: 03/09/2024   PCP: Myrla Jon HERO, MD REFERRING PROVIDER: Myrla Jon HERO, MD   END OF SESSION:       Past Medical History:  Diagnosis Date   Allergy    Arthritis    Hypertension    Scoliosis of lumbosacral spine    Past Surgical History:  Procedure Laterality Date   CESAREAN SECTION     COLONOSCOPY WITH PROPOFOL  N/A 05/11/2017   Procedure: COLONOSCOPY WITH PROPOFOL ;  Surgeon: Dessa Reyes ORN, MD;  Location: [facility];  Service: Endoscopy;  Laterality: N/A;   COLONOSCOPY WITH PROPOFOL  N/A 12/16/2021   Procedure: COLONOSCOPY WITH PROPOFOL ;  Surgeon: Dessa Reyes ORN, MD;  Location: [facility];  Service: Endoscopy;  Laterality: N/A;   DILATATION & CURETTAGE/HYSTEROSCOPY WITH MYOSURE N/A 02/27/2016   Procedure: DILATATION & CURETTAGE/HYSTEROSCOPY WITH MYOSURE;  Surgeon: Debby JINNY Dinsmore, MD;  Location: [facility];  Service: Gynecology;  Laterality: N/A;   ESOPHAGOGASTRODUODENOSCOPY (EGD) WITH PROPOFOL  N/A 05/11/2017   Procedure: ESOPHAGOGASTRODUODENOSCOPY (EGD) WITH PROPOFOL ;  Surgeon: Dessa Reyes ORN, MD;  Location: [facility];  Service: Endoscopy;  Laterality: N/A;   sebaceous syst removed  08/24/2011   tonsillectomy     TONSILLECTOMY     Patient Active Problem List   Diagnosis Date Noted   Rheumatoid arthritis (HCC) 05/30/2023   Bilateral hand swelling 12/28/2022   Eczema 04/13/2018   Irritable bowel syndrome with diarrhea 04/13/2018   Prediabetes 04/13/2018   PMB (postmenopausal bleeding) 10/05/2017   Gastro-esophageal reflux disease with esophagitis 05/13/2017   Strain of muscle of right hip 05/06/2017   Bilateral hearing loss 04/12/2017   Scoliosis of lumbosacral spine 03/30/2017   Abnormality of gait and mobility 03/30/2017   Allergic rhinitis 09/08/2015   Hypertension 09/08/2015   Gonalgia  09/08/2015   RAD (reactive airway disease) 09/08/2015   Obesity 03/12/2015   Current tear of meniscus 01/02/2015   Tear of meniscus of knee 01/02/2015    ONSET DATE: Years ago  REFERRING DIAG: R26.9 (ICD-10-CM) - Abnormal gait   THERAPY DIAG:  No diagnosis found.  Rationale for Evaluation and Treatment: Rehabilitation  SUBJECTIVE:                                                                                                                                                                                             SUBJECTIVE STATEMENT:  Pt reports she is having increased R knee pain and swelling since trying to perform quadruped exercise during last therapy session. Pt states coming down the steps this morning also  exacerbated it. Rates ache type of pain as 2/10 currently, but states last night it was 10/10 but she was able to support it in a way for her to rest. Pt states she almost cancelled her appointment today, but decided to come and do what she can. Pt states pain occurs with flexion.  Pt reports she is feeling really sore still from Tuesday's therapy session.   From eval:  Patient reports she had previously went to [facility] to see Ortho and was referred for physical therapy, but due to logistical errors she did not start PT at that time.  Pt states these gait deviations started years ago. Reports no injury at the time they started. Pt states she has always walked a lot for exercise, but it is hard for her to do now. Pt states she has noticed her gait deviations because she has started getting L thigh pain, but otherwise does not have pain. Pt states these deviations impact her balance.  States she has difficulty walking slow because her gait deviations are more pronounced.   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 mother, and immobile husband due to him needing a hip replacement.  Pt accompanied by:  self  PERTINENT HISTORY: PMH: Rheumatoid arthritis, obesity, prediabetes, HTN  Per MD note on 12/13/2023: Trendelenburg gait due to hip weakness Trendelenburg gait due to hip weakness identified. Previous recommendation for physical therapy was not followed through due to logistical issues. - Refer to physical therapy for hip strengthening exercises  PAIN:  Are you having pain? None related to hip weakness, but will have pain in hands when overusing them due to RA  PRECAUTIONS: None  RED FLAGS: None   WEIGHT BEARING RESTRICTIONS: No  FALLS: Has patient fallen in last 6 months? Yes. Number of falls 1x tripped backwards over her cat  LIVING ENVIRONMENT: Lives with: lives with their spouse and lives with their son (husband and special needs son) Lives in: House/apartment - 1 level Stairs: Yes: External: 1 steps; on left going up Has following equipment at home: shower chair and Grab bars  PLOF: Independent, Independent with household mobility without device, and Independent with homemaking with ambulation  PATIENT GOALS: be able to walk with a normal gait  OBJECTIVE:  Note: Objective measures were completed at Evaluation unless otherwise noted.  DIAGNOSTIC FINDINGS: N/A  COGNITION: Overall cognitive status: Within functional limits for tasks assessed   SENSATION: WFL  COORDINATION: Not formally assessed  EDEMA:  Not formally assessed, but none observed  MUSCLE TONE: appears WNL  MUSCLE LENGTH: Not formally assessed During supine testing, it is possible L LE is slightly longer than R LE, but didn't formally measure  DTRs:  Not formally assessed  POSTURE: rounded shoulders and forward head Need to assess spine curvature in more detail  LOWER EXTREMITY ROM:     Active   WFL/WNL throughout Right Eval Left Eval  Hip flexion    Hip extension    Hip abduction    Hip adduction    Hip internal rotation    Hip external rotation    Knee flexion    Knee  extension    Ankle dorsiflexion    Ankle plantarflexion    Ankle inversion    Ankle eversion     (Blank rows = not tested)  LOWER EXTREMITY MMT:    MMT Right Eval Left Eval Right 6/17 Left 6/17  Hip flexion 4- 4+ 4  Hip extension      Hip abduction 2+  Tested in sidelying 4- Tested in sidelying 3- 4   Hip adduction      Hip internal rotation      Hip external rotation 3+ 4 4   Knee flexion 5 5    Knee extension 5 5    Ankle dorsiflexion 5 5    Ankle plantarflexion 5 5    Ankle inversion      Ankle eversion      (Blank rows = not tested)  Manual Muscle Test Scale 0/5 = No muscle contraction can be seen or felt 1/5 = Contraction can be felt, but there is no motion 2-/5 = Part moves through incomplete ROM w/ gravity decreased 2/5 = Part moves through complete ROM w/ gravity decreased 2+/5 = Part moves through incomplete ROM (<50%) against gravity or through complete ROM w/ gravity 3-/5 = Part moves through incomplete ROM (>50%) against gravity 3/5 = Part moves through complete ROM against gravity 3+/5 = Part moves through complete ROM against gravity/slight resistance 4-/5= Holds test position against slight to moderate pressure 4/5 = Part moves through complete ROM against gravity/moderate resistance 4+/5= Holds test position against moderate to strong pressure 5/5 = Part moves through complete ROM against gravity/full resistance  BED MOBILITY:  Findings: Sit to supine Modified independence Supine to sit Modified independence  TRANSFERS: Sit to stand: Complete Independence  Assistive device utilized: None     Stand to sit: Complete Independence  Assistive device utilized: None     Chair to chair: Complete Independence  Assistive device utilized: None       RAMP:  Not tested  CURB:  Not tested  STAIRS: Findings: Level of Assistance: Modified independence, Stair Negotiation Technique: Alternating Pattern  Forwards with Single Rail on Right Single Rail on  Left Bilateral Rails, Number of Stairs: 4, Height of Stairs: 6in   , and Comments: pt varying in railings used, but reciprocal pattern throughout GAIT: Findings: Gait Characteristics: trendelenburg and lateral lean- Left, Distance walked: 144ft, Assistive device utilized:None, Level of assistance: Complete Independence, and Comments: L lateral trunk lean during every R stance phase due to R hip weakness and possible leg length discrepancy     FUNCTIONAL TESTS:  5 times sit to stand: 12.97 seconds 6 minute walk test: need to assess 10 meter walk test: 1.27m/s without AD Berg Balance Scale: need to assess Functional gait assessment: need to assess  PATIENT SURVEYS:  ABC scale 94.375% with pt reporting least confidence standing on chair to reach for something, stepping on/off escalator without holding on, and walking on ice - otherwise pt reporting 95-100% on other items                                                                                                                               TREATMENT DATE: 03/09/2024  Beginning of therapy session spent focusing on R knee pain management to  allow increased participation in hip strengthening exercises.  Supine hooklying R knee posterior to anterior tibial grade 2 mobilizations 3x 30 sec Supine R LE PROM knee flexion/extension x10reps for pain modulation  Sitting knee flexion during posterior to anterior tibial grade 2 mobilizations 3x 30sec  Seated R LE long arc quad AROM x15 reps   Gait ~15ft, no AD, independently with pt reporting improvement in her R knee pain.   L sidelying R LE modified clamshell with LE starting in hip internal rotation 2x6 reps Pt reports really feeling it in her R hip abductors/external rotators Would be a good exercise to continue  Supine bridge x10reps Cuing for exhale on exertion to activate deep core Added RTB resistance around knees for increased hip abductor activation x15reps Bridge with march  x10reps each with 1 sec hold with each lift Pt demonstrates significant improvement in R hip stability while lifting L LE without R knee adduction and hip internal rotation compared to last time seen by this therapist!   Standing with B UE support and mirror feedback R LE fire hydrants with RTB resistance around thighs 2x6reps  Sumo squats with B LEs externally rotated 2x10reps in // bars for support as needed    PATIENT EDUCATION: Education details: Pt educated throughout session about proper posture and technique with exercises. Improved exercise technique, movement at target joints, use of target muscles after min to mod verbal, visual, tactile cues. Person educated: Patient Education method: Explanation and Handouts Education comprehension: verbalized understanding and needs further education  HOME EXERCISE PROGRAM:  Access Code: [email or web address] URL: [email or web address] Date: 02/09/2024 Prepared by: Lonni Gainer  Exercises - Supine Bridge with Resistance Band  - 1 x daily - 7 x weekly - 2 sets - 15 reps - Marching Bridge  - 1 x daily - 7 x weekly - 2 sets - 10 reps - Supine Hip Adduction Isometric with Ball  - 1 x daily - 7 x weekly - 2 sets - 10 reps - 3-5 sec  hold - Standing Side Plank on Wall  - 1 x daily - 7 x weekly - 3 sets - 30 second  hold - Side Stepping with Resistance at Thighs and Counter Support  - 1 x daily - 7 x weekly - 2-3 sets - 10 reps - Modified Bird Dog At Wall  - 1 x daily - 7 x weekly - 2 sets - 10 reps - Lateral Step Up with Counter Support  - 1 x daily - 7 x weekly - 2 sets - 10 reps   GOALS: Goals reviewed with patient? Yes  SHORT TERM GOALS: Target date: 03/08/2024   Patient will be independent in home exercise program to improve strength/mobility for better functional independence with ADLs.  Baseline: 12/15/2023 initial HEP provided 6/17: pt completing regularly Goal status: MET   LONG TERM GOALS: Target date:  03/27/2024  Patient will increase Berg Balance score to > 51/56 to demonstrate improved balance and decreased fall risk during functional activities and ADLs.  Baseline: 55 Goal status: MET  2.  Patient will increase six minute walk test distance to >1012ft for progression to community ambulator and improve gait ability  Baseline: 1080 ft  Goal status: MET  3.  Patient will increase Functional Gait Assessment (FGA) score to >20/30 as to reduce fall risk and improve dynamic gait safety with community ambulation.  Baseline: 23 Goal status: MET  4.  Patient will maintain 30 seconds of single leg stance on each LE without loss  of balance indicating increased hip strength and improved ability to ambulate and navigate stairs without trendelenburg pattern. Baseline: L LE: 5 seconds, R LE: 0 seconds 6/17: 17 sec, 3-4 sec Goal status: ONGOING  5.  Patient will increase BLE gross strength to 4+/5 as to improve functional strength for independent gait, increased standing tolerance and increased ADL ability.  Baseline: see above Goal status: ONGOING  7.  Patient will improve lower extremity functional scale by 10 points or greater in order to indicate improved function as it relates to her lower extremity weakness. Baseline: 50 Goal status: INITIAL  8.  Patient will report and demonstrate little to no difficulty when ascending 10 stairs or more in order to improve her safety and confidence with community ambulation and accessing her environment. Baseline: moderate difficulty Goal status: INITIAL     ASSESSMENT:  CLINICAL IMPRESSION:  Continued with current plan of care as laid out in evaluation and recent prior sessions. Pt remains motivated to advance progress towards goals in order to maximize independence and safety at home. Pt requires high level cuing for completion of exercises in order to provide adequate level of stimulation challenge while minimizing pain and discomfort when possible.  Started session focusing on management of R knee pain to allow increased participation in therapy session with pt reporting improvement, resulting in increased ability to participate in standing and weightbearing strengthening exercises. Introduced 2 additional open chain R LE hip abductor and external rotation strengthening exercise today since pt reporting still having significant soreness from last therapy session. Patient continues to demo improvement in R hip strength as noted by significant improvement in stability during bridges with marching. Pt will continue to benefit from skilled physical therapy intervention to address impairments, improve QOL, and attain therapy goals.      OBJECTIVE IMPAIRMENTS: Abnormal gait, decreased activity tolerance, decreased balance, decreased endurance, decreased knowledge of condition, decreased mobility, difficulty walking, decreased strength, improper body mechanics, and pain.   ACTIVITY LIMITATIONS: carrying, lifting, bending, standing, squatting, stairs, transfers, dressing, locomotion level, and caring for others  PARTICIPATION LIMITATIONS: cleaning, laundry, shopping, and community activity  PERSONAL FACTORS: Age, Sex, Time since onset of injury/illness/exacerbation, and 3+ comorbidities: Rheumatoid arthritis, obesity, prediabetes, HTN are also affecting patient's functional outcome.   REHAB POTENTIAL: Good  CLINICAL DECISION MAKING: Stable/uncomplicated  EVALUATION COMPLEXITY: Low  PLAN:  PT FREQUENCY: 1-2x/week  PT DURATION: 12 weeks  PLANNED INTERVENTIONS: 97164- PT Re-evaluation, 97750- Physical Performance Testing, 97110-Therapeutic exercises, 97530- Therapeutic activity, [id number]- Neuromuscular re-education, 97535- Self Care, [id number]- Manual therapy, [id number]- Gait training, [phone]- Orthotic Initial, [phone]- Orthotic/Prosthetic subsequent, [phone]- Canalith repositioning, [phone]- Electrical stimulation (manual), Patient/Family education, Balance  training, Stair training, Taping, Dry Needling, Joint mobilization, Joint manipulation, Spinal mobilization, Vestibular training, DME instructions, Cryotherapy, Moist heat, and Biofeedback  PLAN FOR NEXT SESSION:   - focus on hip strengthening, specifically R hip abductors to decrease trendelenburg during gait -progress with static balance, particularly on R LE  No Q ped position    Note: Portions of this document were prepared using Dragon voice recognition software and although reviewed may contain unintentional dictation errors in syntax, grammar, or spelling.  Lonni KATHEE Gainer PT ,DPT Physical Therapist- [place]  [facility]   8:32 AM 03/09/24

## 2024-03-12 ENCOUNTER — Ambulatory Visit

## 2024-03-12 ENCOUNTER — Encounter: Payer: Self-pay | Admitting: Physical Therapy

## 2024-03-12 DIAGNOSIS — R2681 Unsteadiness on feet: Secondary | ICD-10-CM

## 2024-03-12 DIAGNOSIS — R531 Weakness: Secondary | ICD-10-CM | POA: Diagnosis not present

## 2024-03-12 DIAGNOSIS — R269 Unspecified abnormalities of gait and mobility: Secondary | ICD-10-CM

## 2024-03-12 NOTE — Therapy (Signed)
 OUTPATIENT PHYSICAL THERAPY NEURO TREATMENT   Patient Name: Rita Bailey MRN: [id number] DOB:1950/10/03, 73 y.o., female Today's Date: 03/12/2024   PCP: Myrla Jon HERO, MD REFERRING PROVIDER: Myrla Jon HERO, MD   END OF SESSION:   PT End of Session - 03/12/24 1445     Visit Number 17    Number of Visits 24    Date for PT Re-Evaluation 03/27/24    Progress Note Due on Visit 20    PT Start Time 1445    PT Stop Time 1526    PT Time Calculation (min) 41 min    Equipment Utilized During Treatment Gait belt    Activity Tolerance Patient tolerated treatment well    Behavior During Therapy WFL for tasks assessed/performed             Past Medical History:  Diagnosis Date   Allergy    Arthritis    Hypertension    Scoliosis of lumbosacral spine    Past Surgical History:  Procedure Laterality Date   CESAREAN SECTION     COLONOSCOPY WITH PROPOFOL  N/A 05/11/2017   Procedure: COLONOSCOPY WITH PROPOFOL ;  Surgeon: Dessa Reyes ORN, MD;  Location: [facility];  Service: Endoscopy;  Laterality: N/A;   COLONOSCOPY WITH PROPOFOL  N/A 12/16/2021   Procedure: COLONOSCOPY WITH PROPOFOL ;  Surgeon: Dessa Reyes ORN, MD;  Location: [facility];  Service: Endoscopy;  Laterality: N/A;   DILATATION & CURETTAGE/HYSTEROSCOPY WITH MYOSURE N/A 02/27/2016   Procedure: DILATATION & CURETTAGE/HYSTEROSCOPY WITH MYOSURE;  Surgeon: Debby JINNY Dinsmore, MD;  Location: [facility];  Service: Gynecology;  Laterality: N/A;   ESOPHAGOGASTRODUODENOSCOPY (EGD) WITH PROPOFOL  N/A 05/11/2017   Procedure: ESOPHAGOGASTRODUODENOSCOPY (EGD) WITH PROPOFOL ;  Surgeon: Dessa Reyes ORN, MD;  Location: [facility];  Service: Endoscopy;  Laterality: N/A;   sebaceous syst removed  08/24/2011   tonsillectomy     TONSILLECTOMY     Patient Active Problem List   Diagnosis Date Noted   Rheumatoid arthritis (HCC) 05/30/2023   Bilateral hand swelling 12/28/2022   Eczema 04/13/2018   Irritable bowel  syndrome with diarrhea 04/13/2018   Prediabetes 04/13/2018   PMB (postmenopausal bleeding) 10/05/2017   Gastro-esophageal reflux disease with esophagitis 05/13/2017   Strain of muscle of right hip 05/06/2017   Bilateral hearing loss 04/12/2017   Scoliosis of lumbosacral spine 03/30/2017   Abnormality of gait and mobility 03/30/2017   Allergic rhinitis 09/08/2015   Hypertension 09/08/2015   Gonalgia 09/08/2015   RAD (reactive airway disease) 09/08/2015   Obesity 03/12/2015   Current tear of meniscus 01/02/2015   Tear of meniscus of knee 01/02/2015    ONSET DATE: Years ago  REFERRING DIAG: R26.9 (ICD-10-CM) - Abnormal gait   THERAPY DIAG:  Generalized weakness  Unsteadiness on feet  Abnormality of gait  Rationale for Evaluation and Treatment: Rehabilitation  SUBJECTIVE:  SUBJECTIVE STATEMENT:   Patient reports R knee is feeling better this date. She did a lot of walking yesterday.    From eval:  Patient reports she had previously went to [facility] to see Ortho and was referred for physical therapy, but due to logistical errors she did not start PT at that time.  Pt states these gait deviations started years ago. Reports no injury at the time they started. Pt states she has always walked a lot for exercise, but it is hard for her to do now. Pt states she has noticed her gait deviations because she has started getting L thigh pain, but otherwise does not have pain. Pt states these deviations impact her balance.  States she has difficulty walking slow because her gait deviations are more pronounced.   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 mother, and immobile husband due to him needing a hip replacement.  Pt accompanied by:  self  PERTINENT HISTORY: PMH: Rheumatoid arthritis, obesity, prediabetes, HTN  Per MD note on 12/13/2023: Trendelenburg gait due to hip weakness Trendelenburg gait due to hip weakness identified. Previous recommendation for physical therapy was not followed through due to logistical issues. - Refer to physical therapy for hip strengthening exercises  PAIN:  Are you having pain? None related to hip weakness, but will have pain in hands when overusing them due to RA  PRECAUTIONS: None  RED FLAGS: None   WEIGHT BEARING RESTRICTIONS: No  FALLS: Has patient fallen in last 6 months? Yes. Number of falls 1x tripped backwards over her cat  LIVING ENVIRONMENT: Lives with: lives with their spouse and lives with their son (husband and special needs son) Lives in: House/apartment - 1 level Stairs: Yes: External: 1 steps; on left going up Has following equipment at home: shower chair and Grab bars  PLOF: Independent, Independent with household mobility without device, and Independent with homemaking with ambulation  PATIENT GOALS: be able to walk with a normal gait  OBJECTIVE:  Note: Objective measures were completed at Evaluation unless otherwise noted.  DIAGNOSTIC FINDINGS: N/A  COGNITION: Overall cognitive status: Within functional limits for tasks assessed   SENSATION: WFL  COORDINATION: Not formally assessed  EDEMA:  Not formally assessed, but none observed  MUSCLE TONE: appears WNL  MUSCLE LENGTH: Not formally assessed During supine testing, it is possible L LE is slightly longer than R LE, but didn't formally measure  DTRs:  Not formally assessed  POSTURE: rounded shoulders and forward head Need to assess spine curvature in more detail  LOWER EXTREMITY ROM:     Active   WFL/WNL throughout Right Eval Left Eval  Hip flexion    Hip extension    Hip abduction    Hip adduction    Hip internal rotation    Hip external rotation    Knee flexion    Knee  extension    Ankle dorsiflexion    Ankle plantarflexion    Ankle inversion    Ankle eversion     (Blank rows = not tested)  LOWER EXTREMITY MMT:    MMT Right Eval Left Eval Right 6/17 Left 6/17  Hip flexion 4- 4+ 4   Hip extension      Hip abduction 2+  Tested in sidelying 4- Tested in sidelying 3- 4   Hip adduction      Hip internal rotation      Hip external rotation 3+ 4 4  Knee flexion 5 5    Knee extension 5 5    Ankle dorsiflexion 5 5    Ankle plantarflexion 5 5    Ankle inversion      Ankle eversion      (Blank rows = not tested)  Manual Muscle Test Scale 0/5 = No muscle contraction can be seen or felt 1/5 = Contraction can be felt, but there is no motion 2-/5 = Part moves through incomplete ROM w/ gravity decreased 2/5 = Part moves through complete ROM w/ gravity decreased 2+/5 = Part moves through incomplete ROM (<50%) against gravity or through complete ROM w/ gravity 3-/5 = Part moves through incomplete ROM (>50%) against gravity 3/5 = Part moves through complete ROM against gravity 3+/5 = Part moves through complete ROM against gravity/slight resistance 4-/5= Holds test position against slight to moderate pressure 4/5 = Part moves through complete ROM against gravity/moderate resistance 4+/5= Holds test position against moderate to strong pressure 5/5 = Part moves through complete ROM against gravity/full resistance  BED MOBILITY:  Findings: Sit to supine Modified independence Supine to sit Modified independence  TRANSFERS: Sit to stand: Complete Independence  Assistive device utilized: None     Stand to sit: Complete Independence  Assistive device utilized: None     Chair to chair: Complete Independence  Assistive device utilized: None       RAMP:  Not tested  CURB:  Not tested  STAIRS: Findings: Level of Assistance: Modified independence, Stair Negotiation Technique: Alternating Pattern  Forwards with Single Rail on Right Single Rail on  Left Bilateral Rails, Number of Stairs: 4, Height of Stairs: 6in   , and Comments: pt varying in railings used, but reciprocal pattern throughout GAIT: Findings: Gait Characteristics: trendelenburg and lateral lean- Left, Distance walked: 167ft, Assistive device utilized:None, Level of assistance: Complete Independence, and Comments: L lateral trunk lean during every R stance phase due to R hip weakness and possible leg length discrepancy     FUNCTIONAL TESTS:  5 times sit to stand: 12.97 seconds 6 minute walk test: need to assess 10 meter walk test: 1.78m/s without AD Berg Balance Scale: need to assess Functional gait assessment: need to assess  PATIENT SURVEYS:  ABC scale 94.375% with pt reporting least confidence standing on chair to reach for something, stepping on/off escalator without holding on, and walking on ice - otherwise pt reporting 95-100% on other items                                                                                                                               TREATMENT DATE: 03/12/2024    Sidestepping with GTB around ankles x 3 laps in // bars   Standing hip 3 way with GTB 2 x 10 each LE   Double limb press 40# 2 x 10  Leg press with R LE eccentric SL phase 40# 2 x 10  Sit to stand with GTB around knees 2  x 10 with no UE support   Sumo squats with B LEs externally rotated x 12 in // bars for support as needed    PATIENT EDUCATION: Education details: Pt educated throughout session about proper posture and technique with exercises. Improved exercise technique, movement at target joints, use of target muscles after min to mod verbal, visual, tactile cues. Person educated: Patient Education method: Explanation and Handouts Education comprehension: verbalized understanding and needs further education  HOME EXERCISE PROGRAM:  Access Code: [email or web address] URL: [email or web address] Date: 02/09/2024 Prepared by: Lonni Gainer  Exercises - Supine Bridge with Resistance Band  - 1 x daily - 7 x weekly - 2 sets - 15 reps - Marching Bridge  - 1 x daily - 7 x weekly - 2 sets - 10 reps - Supine Hip Adduction Isometric with Ball  - 1 x daily - 7 x weekly - 2 sets - 10 reps - 3-5 sec  hold - Standing Side Plank on Wall  - 1 x daily - 7 x weekly - 3 sets - 30 second  hold - Side Stepping with Resistance at Thighs and Counter Support  - 1 x daily - 7 x weekly - 2-3 sets - 10 reps - Modified Bird Dog At Wall  - 1 x daily - 7 x weekly - 2 sets - 10 reps - Lateral Step Up with Counter Support  - 1 x daily - 7 x weekly - 2 sets - 10 reps   GOALS: Goals reviewed with patient? Yes  SHORT TERM GOALS: Target date: 03/08/2024   Patient will be independent in home exercise program to improve strength/mobility for better functional independence with ADLs.  Baseline: 12/15/2023 initial HEP provided 6/17: pt completing regularly Goal status: MET   LONG TERM GOALS: Target date: 03/27/2024  Patient will increase Berg Balance score to > 51/56 to demonstrate improved balance and decreased fall risk during functional activities and ADLs.  Baseline: 55 Goal status: MET  2.  Patient will increase six minute walk test distance to >1010ft for progression to community ambulator and improve gait ability  Baseline: 1080 ft  Goal status: MET  3.  Patient will increase Functional Gait Assessment (FGA) score to >20/30 as to reduce fall risk and improve dynamic gait safety with community ambulation.  Baseline: 23 Goal status: MET  4.  Patient will maintain 30 seconds of single leg stance on each LE without loss of balance indicating increased hip strength and improved ability to ambulate and navigate stairs without trendelenburg pattern. Baseline: L LE: 5 seconds, R LE: 0 seconds 6/17: 17 sec, 3-4 sec Goal status: ONGOING  5.  Patient will increase BLE gross strength to 4+/5 as to improve functional strength for independent gait,  increased standing tolerance and increased ADL ability.  Baseline: see above Goal status: ONGOING  7.  Patient will improve lower extremity functional scale by 10 points or greater in order to indicate improved function as it relates to her lower extremity weakness. Baseline: 50 Goal status: INITIAL  8.  Patient will report and demonstrate little to no difficulty when ascending 10 stairs or more in order to improve her safety and confidence with community ambulation and accessing her environment. Baseline: moderate difficulty Goal status: INITIAL     ASSESSMENT:  CLINICAL IMPRESSION:    Continued with current plan of care as laid out in evaluation and recent prior sessions. Pt remains motivated to advance progress towards goals in order to maximize independence and safety at home.  Session focused on glute/hip abductor strengthening. Tolerated session well with no increase in R knee pain reported at end of session. Pt will continue to benefit from skilled physical therapy intervention to address impairments, improve QOL, and attain therapy goals.      OBJECTIVE IMPAIRMENTS: Abnormal gait, decreased activity tolerance, decreased balance, decreased endurance, decreased knowledge of condition, decreased mobility, difficulty walking, decreased strength, improper body mechanics, and pain.   ACTIVITY LIMITATIONS: carrying, lifting, bending, standing, squatting, stairs, transfers, dressing, locomotion level, and caring for others  PARTICIPATION LIMITATIONS: cleaning, laundry, shopping, and community activity  PERSONAL FACTORS: Age, Sex, Time since onset of injury/illness/exacerbation, and 3+ comorbidities: Rheumatoid arthritis, obesity, prediabetes, HTN are also affecting patient's functional outcome.   REHAB POTENTIAL: Good  CLINICAL DECISION MAKING: Stable/uncomplicated  EVALUATION COMPLEXITY: Low  PLAN:  PT FREQUENCY: 1-2x/week  PT DURATION: 12 weeks  PLANNED INTERVENTIONS:  [id number]- PT Re-evaluation, 97750- Physical Performance Testing, 97110-Therapeutic exercises, 97530- Therapeutic activity, [id number]- Neuromuscular re-education, 97535- Self Care, [id number]- Manual therapy, [id number]- Gait training, [phone]- Orthotic Initial, [phone]- Orthotic/Prosthetic subsequent, [phone]- Canalith repositioning, [phone]- Electrical stimulation (manual), Patient/Family education, Balance training, Stair training, Taping, Dry Needling, Joint mobilization, Joint manipulation, Spinal mobilization, Vestibular training, DME instructions, Cryotherapy, Moist heat, and Biofeedback  PLAN FOR NEXT SESSION:  - focus on hip strengthening, specifically R hip abductors to decrease trendelenburg during gait -progress with static balance, particularly on R LE  No Q ped position   Maryanne Finder, PT, DPT Physical Therapist - [place]  [facility] 2:45 PM 03/12/24

## 2024-03-13 ENCOUNTER — Ambulatory Visit: Payer: PRIVATE HEALTH INSURANCE | Admitting: Physical Therapy

## 2024-03-15 ENCOUNTER — Telehealth: Payer: Self-pay

## 2024-03-15 ENCOUNTER — Ambulatory Visit: Payer: PRIVATE HEALTH INSURANCE | Admitting: Physical Therapy

## 2024-03-15 NOTE — Telephone Encounter (Signed)
 Patient called due to No Show. Left voicemail with time and date of next appointment.   Rita Bailey  Leopoldo PT, DPT Physical Therapist - [facility]  Outpatient Physical Therapy- Main Campus [phone]

## 2024-03-20 ENCOUNTER — Ambulatory Visit: Payer: PRIVATE HEALTH INSURANCE | Admitting: Physical Therapy

## 2024-03-22 ENCOUNTER — Ambulatory Visit: Payer: PRIVATE HEALTH INSURANCE | Admitting: Physical Therapy

## 2024-03-27 ENCOUNTER — Ambulatory Visit: Payer: PRIVATE HEALTH INSURANCE | Attending: Family Medicine

## 2024-03-27 DIAGNOSIS — R531 Weakness: Secondary | ICD-10-CM | POA: Insufficient documentation

## 2024-03-27 DIAGNOSIS — R2681 Unsteadiness on feet: Secondary | ICD-10-CM | POA: Insufficient documentation

## 2024-03-27 DIAGNOSIS — R269 Unspecified abnormalities of gait and mobility: Secondary | ICD-10-CM | POA: Diagnosis present

## 2024-03-27 NOTE — Therapy (Signed)
 OUTPATIENT PHYSICAL THERAPY TREATMENT  Patient Name: Rita Bailey MRN: [id number] DOB:09-16-1950, 73 y.o., female Today's Date: 03/27/2024  PCP: Myrla Jon HERO, MD REFERRING PROVIDER: Myrla Jon HERO, MD   END OF SESSION:   PT End of Session - 03/27/24 1402     Visit Number 18    Number of Visits 24    Date for PT Re-Evaluation 03/27/24    Progress Note Due on Visit 20    PT Start Time 1400    PT Stop Time 1440    PT Time Calculation (min) 40 min    Equipment Utilized During Treatment Gait belt    Activity Tolerance Patient tolerated treatment well    Behavior During Therapy WFL for tasks assessed/performed          Past Medical History:  Diagnosis Date   Allergy    Arthritis    Hypertension    Scoliosis of lumbosacral spine    Past Surgical History:  Procedure Laterality Date   CESAREAN SECTION     COLONOSCOPY WITH PROPOFOL  N/A 05/11/2017   Procedure: COLONOSCOPY WITH PROPOFOL ;  Surgeon: Dessa Reyes ORN, MD;  Location: [facility];  Service: Endoscopy;  Laterality: N/A;   COLONOSCOPY WITH PROPOFOL  N/A 12/16/2021   Procedure: COLONOSCOPY WITH PROPOFOL ;  Surgeon: Dessa Reyes ORN, MD;  Location: [facility];  Service: Endoscopy;  Laterality: N/A;   DILATATION & CURETTAGE/HYSTEROSCOPY WITH MYOSURE N/A 02/27/2016   Procedure: DILATATION & CURETTAGE/HYSTEROSCOPY WITH MYOSURE;  Surgeon: Debby JINNY Dinsmore, MD;  Location: [facility];  Service: Gynecology;  Laterality: N/A;   ESOPHAGOGASTRODUODENOSCOPY (EGD) WITH PROPOFOL  N/A 05/11/2017   Procedure: ESOPHAGOGASTRODUODENOSCOPY (EGD) WITH PROPOFOL ;  Surgeon: Dessa Reyes ORN, MD;  Location: [facility];  Service: Endoscopy;  Laterality: N/A;   sebaceous syst removed  08/24/2011   tonsillectomy     TONSILLECTOMY     Patient Active Problem List   Diagnosis Date Noted   Rheumatoid arthritis (HCC) 05/30/2023   Bilateral hand swelling 12/28/2022   Eczema 04/13/2018   Irritable bowel syndrome with  diarrhea 04/13/2018   Prediabetes 04/13/2018   PMB (postmenopausal bleeding) 10/05/2017   Gastro-esophageal reflux disease with esophagitis 05/13/2017   Strain of muscle of right hip 05/06/2017   Bilateral hearing loss 04/12/2017   Scoliosis of lumbosacral spine 03/30/2017   Abnormality of gait and mobility 03/30/2017   Allergic rhinitis 09/08/2015   Hypertension 09/08/2015   Gonalgia 09/08/2015   RAD (reactive airway disease) 09/08/2015   Obesity 03/12/2015   Current tear of meniscus 01/02/2015   Tear of meniscus of knee 01/02/2015    ONSET DATE: Years ago REFERRING DIAG: R26.9 (ICD-10-CM) - Abnormal gait  THERAPY DIAG:  Generalized weakness  Unsteadiness on feet  Abnormality of gait  Rationale for Evaluation and Treatment: Rehabilitation  SUBJECTIVE:  SUBJECTIVE STATEMENT: Pt had a good trip to beach, was very busy on her feet a lot.   PERTINENT HISTORY:  Patient reports she had previously went to [facility] to see Ortho and was referred for physical therapy, but due to logistical errors she did not start PT at that time.  Pt states these gait deviations started years ago. Reports no injury at the time they started. Pt states she has always walked a lot for exercise, but it is hard for her to do now. Pt states she has noticed her gait deviations because she has started getting L thigh pain, but otherwise does not have pain. Pt states these deviations impact her balance. Per MD note on 12/13/2023: Trendelenburg gait due to hip weakness Trendelenburg gait due to hip weakness identified. Previous recommendation for physical therapy was not followed through due to logistical issues. - Refer to physical therapy for hip strengthening exercises. PMH: Rheumatoid arthritis, obesity, prediabetes, HTN.    PAIN:  Are you having pain? None related to hip weakness, but will have pain in hands when overusing them due to RA  PRECAUTIONS: None  WEIGHT BEARING RESTRICTIONS: No  FALLS: Has patient fallen in last 6 months? Yes. Number of falls 1x tripped backwards over her cat  LIVING ENVIRONMENT: Lives with: lives with their spouse and lives with their son (husband and special needs son) Lives in: House/apartment - 1 level Stairs: Yes: External: 1 steps; on left going up Has following equipment at home: shower chair and Grab bars  PLOF: Independent, Independent with household mobility without device, and Independent with homemaking with ambulation  PATIENT GOALS: be able to walk with a normal gait  OBJECTIVE:  Note: Objective measures were completed at Evaluation unless otherwise noted.  FUNCTIONAL TESTS:  5 times sit to stand: 12.97 seconds 6 minute walk test: need to assess 10 meter walk test: 1.58m/s without AD Berg Balance Scale: need to assess Functional gait assessment: need to assess                                                                                                                             TREATMENT DATE: 03/27/2024 -lateral stepping at // bars c GTB x90sec  -STS from chair from knees x10  -lateral stepping at // bars c GTB x90sec  -STS from chair from knees x10  -SLS balance practice, alternating sides for 2 minutes today, UE support as needed  -SLS balance practice, alternating sides for 2 minutes today, UE support as needed -tandem balance 5x20sec bilat, hands free     PATIENT EDUCATION: Education details: Pt educated throughout session about proper posture and technique with exercises. Improved exercise technique, movement at target joints, use of target muscles after min to mod verbal, visual, tactile cues. Person educated: Patient Education method: Explanation and Handouts Education comprehension: verbalized understanding and needs further  education  HOME EXERCISE PROGRAM:  Access Code: [email or web address] URL: [email or web address] Date: 02/09/2024 Prepared by: Lonni Gainer  Exercises - Supine Bridge with Resistance Band  - 1 x daily - 7 x weekly - 2 sets - 15 reps - Marching Bridge  - 1 x daily - 7 x weekly - 2 sets - 10 reps - Supine Hip Adduction Isometric with Ball  - 1 x daily - 7 x weekly - 2 sets - 10 reps - 3-5 sec  hold - Standing Side Plank on Wall  - 1 x daily - 7 x weekly - 3 sets - 30 second  hold - Side Stepping with Resistance at Thighs and Counter Support  - 1 x daily - 7 x weekly - 2-3 sets - 10 reps - Modified Bird Dog At Wall  - 1 x daily - 7 x weekly - 2 sets - 10 reps - Lateral Step Up with Counter Support  - 1 x daily - 7 x weekly - 2 sets - 10 reps  GOALS: Goals reviewed with patient? Yes  SHORT TERM GOALS: Target date: 03/08/2024  Patient will be independent in home exercise program to improve strength/mobility for better functional independence with ADLs.  Baseline: 12/15/2023 initial HEP provided 6/17: pt completing regularly Goal status: MET  LONG TERM GOALS: Target date: 03/27/2024  Patient will increase Berg Balance score to > 51/56 to demonstrate improved balance and decreased fall risk during functional activities and ADLs.  Baseline: 55 Goal status: MET  2.  Patient will increase six minute walk test distance to >1037ft for progression to community ambulator and improve gait ability  Baseline: 1080 ft  Goal status: MET  3.  Patient will increase Functional Gait Assessment (FGA) score to >20/30 as to reduce fall risk and improve dynamic gait safety with community ambulation.  Baseline: 23 Goal status: MET  4.  Patient will maintain 30 seconds of single leg stance on each LE without loss of balance indicating increased hip strength and improved ability to ambulate and navigate stairs without trendelenburg pattern. Baseline: L LE: 5 seconds, R LE: 0 seconds 6/17: 17 sec, 3-4  sec Goal status: ONGOING  5.  Patient will increase BLE gross strength to 4+/5 as to improve functional strength for independent gait, increased standing tolerance and increased ADL ability.  Baseline: see above Goal status: ONGOING  7.  Patient will improve lower extremity functional scale by 10 points or greater in order to indicate improved function as it relates to her lower extremity weakness. Baseline: 50 Goal status: INITIAL  8.  Patient will report and demonstrate little to no difficulty when ascending 10 stairs or more in order to improve her safety and confidence with community ambulation and accessing her environment. Baseline: moderate difficulty Goal status: INITIAL  ASSESSMENT:  CLINICAL IMPRESSION:    Continued with current plan of care as laid out in evaluation and recent prior sessions. Pt remains motivated to advance progress towards goals in order to maximize independence and safety at home. Came back to working with SLS balance practice due to primary measure on LT goals. Session remains focused on glute/hip abductor strengthening and motor control training. Tolerated session well with no increase in Rt knee pain reported at end of session. Lateral stability deficits of Rt hemi pelvis remains strong with compensatory strategies developing. Pt will continue to benefit from skilled physical therapy intervention to address impairments, improve QOL, and attain therapy goals.   OBJECTIVE IMPAIRMENTS: Abnormal gait, decreased activity tolerance, decreased balance, decreased endurance, decreased knowledge of condition, decreased mobility, difficulty walking, decreased strength, improper body mechanics, and pain.  ACTIVITY LIMITATIONS: carrying, lifting, bending, standing, squatting, stairs, transfers, dressing, locomotion level, and caring for others  PARTICIPATION LIMITATIONS: cleaning, laundry, shopping, and community activity  PERSONAL FACTORS: Age, Sex, Time since onset of  injury/illness/exacerbation, and 3+ comorbidities: Rheumatoid arthritis, obesity, prediabetes, HTN are also affecting patient's functional outcome.   REHAB POTENTIAL: Good  CLINICAL DECISION MAKING: Stable/uncomplicated  EVALUATION COMPLEXITY: Low  PLAN:  PT FREQUENCY: 1-2x/week  PT DURATION: 12 weeks  PLANNED INTERVENTIONS: 97164- PT Re-evaluation, 97750- Physical Performance Testing, 97110-Therapeutic exercises, 97530- Therapeutic activity, 97112- Neuromuscular re-education, 97535- Self Care, 02859- Manual therapy, [phone]- Gait training, [phone]- Orthotic Initial, [phone]- Orthotic/Prosthetic subsequent, [phone]- Canalith repositioning, [phone]- Electrical stimulation (manual), Patient/Family education, Balance training, Stair training, Taping, Dry Needling, Joint mobilization, Joint manipulation, Spinal mobilization, Vestibular training, DME instructions, Cryotherapy, Moist heat, and Biofeedback  PLAN FOR NEXT SESSION:  Continue on LT goal progress   2:19 PM, 03/27/24 Peggye JAYSON Linear, PT, DPT Physical Therapist - [facility]  Outpatient Physical Therapy- Main Campus [phone]

## 2024-03-29 ENCOUNTER — Ambulatory Visit: Payer: PRIVATE HEALTH INSURANCE

## 2024-03-29 DIAGNOSIS — R531 Weakness: Secondary | ICD-10-CM

## 2024-03-29 DIAGNOSIS — R269 Unspecified abnormalities of gait and mobility: Secondary | ICD-10-CM

## 2024-03-29 DIAGNOSIS — R2681 Unsteadiness on feet: Secondary | ICD-10-CM

## 2024-03-29 NOTE — Therapy (Signed)
 OUTPATIENT PHYSICAL THERAPY TREATMENT/RE-CERT  Patient Name: Rita Bailey MRN: [id number] DOB:07-12-1951, 73 y.o., female Today's Date: 03/29/2024  PCP: Myrla Jon HERO, MD REFERRING PROVIDER: Myrla Jon HERO, MD   END OF SESSION:    PT End of Session - 03/29/24 1021     Visit Number 19    Number of Visits 35    Date for PT Re-Evaluation 05/24/24    Progress Note Due on Visit 20    PT Start Time 1020    PT Stop Time 1100    PT Time Calculation (min) 40 min    Equipment Utilized During Treatment Gait belt    Activity Tolerance Patient tolerated treatment well    Behavior During Therapy WFL for tasks assessed/performed         Past Medical History:  Diagnosis Date   Allergy    Arthritis    Hypertension    Scoliosis of lumbosacral spine    Past Surgical History:  Procedure Laterality Date   CESAREAN SECTION     COLONOSCOPY WITH PROPOFOL  N/A 05/11/2017   Procedure: COLONOSCOPY WITH PROPOFOL ;  Surgeon: Dessa Reyes ORN, MD;  Location: [facility];  Service: Endoscopy;  Laterality: N/A;   COLONOSCOPY WITH PROPOFOL  N/A 12/16/2021   Procedure: COLONOSCOPY WITH PROPOFOL ;  Surgeon: Dessa Reyes ORN, MD;  Location: [facility];  Service: Endoscopy;  Laterality: N/A;   DILATATION & CURETTAGE/HYSTEROSCOPY WITH MYOSURE N/A 02/27/2016   Procedure: DILATATION & CURETTAGE/HYSTEROSCOPY WITH MYOSURE;  Surgeon: Debby JINNY Dinsmore, MD;  Location: [facility];  Service: Gynecology;  Laterality: N/A;   ESOPHAGOGASTRODUODENOSCOPY (EGD) WITH PROPOFOL  N/A 05/11/2017   Procedure: ESOPHAGOGASTRODUODENOSCOPY (EGD) WITH PROPOFOL ;  Surgeon: Dessa Reyes ORN, MD;  Location: [facility];  Service: Endoscopy;  Laterality: N/A;   sebaceous syst removed  08/24/2011   tonsillectomy     TONSILLECTOMY     Patient Active Problem List   Diagnosis Date Noted   Rheumatoid arthritis (HCC) 05/30/2023   Bilateral hand swelling 12/28/2022   Eczema 04/13/2018   Irritable bowel syndrome  with diarrhea 04/13/2018   Prediabetes 04/13/2018   PMB (postmenopausal bleeding) 10/05/2017   Gastro-esophageal reflux disease with esophagitis 05/13/2017   Strain of muscle of right hip 05/06/2017   Bilateral hearing loss 04/12/2017   Scoliosis of lumbosacral spine 03/30/2017   Abnormality of gait and mobility 03/30/2017   Allergic rhinitis 09/08/2015   Hypertension 09/08/2015   Gonalgia 09/08/2015   RAD (reactive airway disease) 09/08/2015   Obesity 03/12/2015   Current tear of meniscus 01/02/2015   Tear of meniscus of knee 01/02/2015    ONSET DATE: Years ago REFERRING DIAG: R26.9 (ICD-10-CM) - Abnormal gait  THERAPY DIAG:  Generalized weakness  Unsteadiness on feet  Abnormality of gait  Rationale for Evaluation and Treatment: Rehabilitation  SUBJECTIVE:  SUBJECTIVE STATEMENT:  Pt reports she is doing well.  Pt is ready to begin re-certification process.    PERTINENT HISTORY:  Patient reports she had previously went to [facility] to see Ortho and was referred for physical therapy, but due to logistical errors she did not start PT at that time.  Pt states these gait deviations started years ago. Reports no injury at the time they started. Pt states she has always walked a lot for exercise, but it is hard for her to do now. Pt states she has noticed her gait deviations because she has started getting L thigh pain, but otherwise does not have pain. Pt states these deviations impact her balance. Per MD note on 12/13/2023: Trendelenburg gait due to hip weakness Trendelenburg gait due to hip weakness identified. Previous recommendation for physical therapy was not followed through due to logistical issues. - Refer to physical therapy for hip strengthening exercises. PMH: Rheumatoid arthritis,  obesity, prediabetes, HTN.   PAIN:  Are you having pain? None related to hip weakness, but will have pain in hands when overusing them due to RA  PRECAUTIONS: None  WEIGHT BEARING RESTRICTIONS: No  FALLS: Has patient fallen in last 6 months? Yes. Number of falls 1x tripped backwards over her cat  LIVING ENVIRONMENT: Lives with: lives with their spouse and lives with their son (husband and special needs son) Lives in: House/apartment - 1 level Stairs: Yes: External: 1 steps; on left going up Has following equipment at home: shower chair and Grab bars  PLOF: Independent, Independent with household mobility without device, and Independent with homemaking with ambulation  PATIENT GOALS: be able to walk with a normal gait  OBJECTIVE:  Note: Objective measures were completed at Evaluation unless otherwise noted.  FUNCTIONAL TESTS:  5 times sit to stand: 12.97 seconds 6 minute walk test: need to assess 10 meter walk test: 1.60m/s without AD Berg Balance Scale: need to assess Functional gait assessment: need to assess                                                                                                                             TREATMENT DATE: 03/29/2024  Physical Performance Testing:  Pt scores 21 / 28 on mini BEST balance test. Scores < 16 indicate increased risk for falls [place], [place], & [place]) and MCID is 4 (Godi,et al, 2013)    Single Leg Stance Time:  L LE: 25 sec; R LE: 4 sec  Patient demonstrates improved fall risk as noted by score of 55/56 on Berg Balance Scale.  (<36= high risk for falls, close to 100%; 37-45 significant >80%; 46-51 moderate >50%; 52-55 lower >25%)    PATIENT EDUCATION: Education details: Pt educated throughout session about proper posture and technique with exercises. Improved exercise technique, movement at target joints, use of target muscles after min to mod verbal, visual, tactile cues. Person educated: Patient Education method:  Explanation and Handouts Education comprehension: verbalized understanding and  needs further education  HOME EXERCISE PROGRAM:  Access Code: [email or web address] URL: [email or web address] Date: 02/09/2024 Prepared by: Lonni Gainer  Exercises - Supine Bridge with Resistance Band  - 1 x daily - 7 x weekly - 2 sets - 15 reps - Marching Bridge  - 1 x daily - 7 x weekly - 2 sets - 10 reps - Supine Hip Adduction Isometric with Ball  - 1 x daily - 7 x weekly - 2 sets - 10 reps - 3-5 sec  hold - Standing Side Plank on Wall  - 1 x daily - 7 x weekly - 3 sets - 30 second  hold - Side Stepping with Resistance at Thighs and Counter Support  - 1 x daily - 7 x weekly - 2-3 sets - 10 reps - Modified Bird Dog At Wall  - 1 x daily - 7 x weekly - 2 sets - 10 reps - Lateral Step Up with Counter Support  - 1 x daily - 7 x weekly - 2 sets - 10 reps  GOALS: Goals reviewed with patient? Yes  SHORT TERM GOALS: Target date: 03/08/2024  Patient will be independent in home exercise program to improve strength/mobility for better functional independence with ADLs.  Baseline: 12/15/2023 initial HEP provided 6/17: pt completing regularly Goal status: MET  LONG TERM GOALS: Target date: 03/27/2024  Patient will increase Berg Balance score to > 51/56 to demonstrate improved balance and decreased fall risk during functional activities and ADLs.  Baseline: 55 Goal status: MET  2.  Patient will increase six minute walk test distance to >1070ft for progression to community ambulator and improve gait ability  Baseline: 1080 ft  Goal status: MET  3.  Patient will increase Functional Gait Assessment (FGA) score to >20/30 as to reduce fall risk and improve dynamic gait safety with community ambulation.  Baseline: 23 Goal status: MET  4.  Patient will maintain 30 seconds of single leg stance on each LE without loss of balance indicating increased hip strength and improved ability to ambulate and navigate stairs  without trendelenburg pattern. Baseline: L LE: 5 seconds, R LE: 0 seconds  6/17: 17 sec, 3-4 sec 03/29/24: L LE: 25; R LE: 4 Goal status: ONGOING  5.  Patient will increase BLE gross strength to 4+/5 as to improve functional strength for independent gait, increased standing tolerance and increased ADL ability.  Baseline: see above Goal status: ONGOING  7.  Patient will improve lower extremity functional scale by 10 points or greater in order to indicate improved function as it relates to her lower extremity weakness. Baseline: 50% 03/29/24: 75% Goal status: MET  8.  Patient will report and demonstrate little to no difficulty when ascending 10 stairs or more in order to improve her safety and confidence with community ambulation and accessing her environment. Baseline: moderate difficulty 03/29/24: moderate difficulty Goal status: IN PROGRESS  9.  Patient will increase Mini-BESTest score by >/= 6 points to demonstrate decreased fall risk during functional activities. Baseline: 21/28 Goal status: INITIAL      ASSESSMENT:  CLINICAL IMPRESSION:     Pt assessed for goals today and required for re-certification.  Pt ultimately has made good improvement towards goals, however still lacks stability on the R LE in single leg stance which is contributing to her instability in gait.  Pt also teste for higher level balance and the R LE is also contributing to her inability to perform some of the tasks safely.  Patient's condition has the potential  to improve in response to therapy. Maximum improvement is yet to be obtained. The anticipated improvement is attainable and reasonable in a generally predictable time.   Pt will continue to benefit from skilled therapy to address remaining deficits in order to improve overall QoL and return to PLOF.     OBJECTIVE IMPAIRMENTS: Abnormal gait, decreased activity tolerance, decreased balance, decreased endurance, decreased knowledge of condition, decreased  mobility, difficulty walking, decreased strength, improper body mechanics, and pain.   ACTIVITY LIMITATIONS: carrying, lifting, bending, standing, squatting, stairs, transfers, dressing, locomotion level, and caring for others  PARTICIPATION LIMITATIONS: cleaning, laundry, shopping, and community activity  PERSONAL FACTORS: Age, Sex, Time since onset of injury/illness/exacerbation, and 3+ comorbidities: Rheumatoid arthritis, obesity, prediabetes, HTN are also affecting patient's functional outcome.   REHAB POTENTIAL: Good  CLINICAL DECISION MAKING: Stable/uncomplicated  EVALUATION COMPLEXITY: Low  PLAN:  PT FREQUENCY: 1-2x/week  PT DURATION: 12 weeks  PLANNED INTERVENTIONS: 97164- PT Re-evaluation, 97750- Physical Performance Testing, 97110-Therapeutic exercises, 97530- Therapeutic activity, [id number]- Neuromuscular re-education, 97535- Self Care, 02859- Manual therapy, [phone]- Gait training, [phone]- Orthotic Initial, [phone]- Orthotic/Prosthetic subsequent, [phone]- Canalith repositioning, [phone]- Electrical stimulation (manual), Patient/Family education, Balance training, Stair training, Taping, Dry Needling, Joint mobilization, Joint manipulation, Spinal mobilization, Vestibular training, DME instructions, Cryotherapy, Moist heat, and Biofeedback  PLAN FOR NEXT SESSION:   Working on R LE single leg stance time Continue on LT goal progress    Fonda Simpers, PT, DPT Physical Therapist - [facility]  03/29/24, 1:46 PM

## 2024-04-03 ENCOUNTER — Ambulatory Visit: Payer: PRIVATE HEALTH INSURANCE | Admitting: Physical Therapy

## 2024-04-03 DIAGNOSIS — R531 Weakness: Secondary | ICD-10-CM

## 2024-04-03 DIAGNOSIS — R2681 Unsteadiness on feet: Secondary | ICD-10-CM

## 2024-04-03 DIAGNOSIS — R269 Unspecified abnormalities of gait and mobility: Secondary | ICD-10-CM

## 2024-04-03 NOTE — Therapy (Addendum)
 OUTPATIENT PHYSICAL THERAPY TREATMENT/Physical Therapy Progress Note   Dates of reporting period  01/31/24   to   04/03/24   Patient Name: Rita Bailey MRN: [id number] DOB:1951-07-26, 73 y.o., female Today's Date: 04/03/2024  PCP: Myrla Jon HERO, MD REFERRING PROVIDER: Myrla Jon HERO, MD   END OF SESSION:    PT End of Session - 04/03/24 1405     Visit Number 20    Number of Visits 35    Date for PT Re-Evaluation 05/24/24    Progress Note Due on Visit 30    PT Start Time 0204    PT Stop Time 0243    PT Time Calculation (min) 39 min    Equipment Utilized During Treatment Gait belt    Activity Tolerance Patient tolerated treatment well    Behavior During Therapy [facility] for tasks assessed/performed          Past Medical History:  Diagnosis Date   Allergy    Arthritis    Hypertension    Scoliosis of lumbosacral spine    Past Surgical History:  Procedure Laterality Date   CESAREAN SECTION     COLONOSCOPY WITH PROPOFOL  N/A 05/11/2017   Procedure: COLONOSCOPY WITH PROPOFOL ;  Surgeon: Dessa Reyes ORN, MD;  Location: [facility] ENDOSCOPY;  Service: Endoscopy;  Laterality: N/A;   COLONOSCOPY WITH PROPOFOL  N/A 12/16/2021   Procedure: COLONOSCOPY WITH PROPOFOL ;  Surgeon: Dessa Reyes ORN, MD;  Location: [facility] ENDOSCOPY;  Service: Endoscopy;  Laterality: N/A;   DILATATION & CURETTAGE/HYSTEROSCOPY WITH MYOSURE N/A 02/27/2016   Procedure: DILATATION & CURETTAGE/HYSTEROSCOPY WITH MYOSURE;  Surgeon: Debby JINNY Dinsmore, MD;  Location: [facility] ORS;  Service: Gynecology;  Laterality: N/A;   ESOPHAGOGASTRODUODENOSCOPY (EGD) WITH PROPOFOL  N/A 05/11/2017   Procedure: ESOPHAGOGASTRODUODENOSCOPY (EGD) WITH PROPOFOL ;  Surgeon: Dessa Reyes ORN, MD;  Location: [facility] ENDOSCOPY;  Service: Endoscopy;  Laterality: N/A;   sebaceous syst removed  08/24/2011   tonsillectomy     TONSILLECTOMY     Patient Active Problem List   Diagnosis Date Noted   Rheumatoid arthritis (HCC) 05/30/2023    Bilateral hand swelling 12/28/2022   Eczema 04/13/2018   Irritable bowel syndrome with diarrhea 04/13/2018   Prediabetes 04/13/2018   PMB (postmenopausal bleeding) 10/05/2017   Gastro-esophageal reflux disease with esophagitis 05/13/2017   Strain of muscle of right hip 05/06/2017   Bilateral hearing loss 04/12/2017   Scoliosis of lumbosacral spine 03/30/2017   Abnormality of gait and mobility 03/30/2017   Allergic rhinitis 09/08/2015   Hypertension 09/08/2015   Gonalgia 09/08/2015   RAD (reactive airway disease) 09/08/2015   Obesity 03/12/2015   Current tear of meniscus 01/02/2015   Tear of meniscus of knee 01/02/2015    ONSET DATE: Years ago REFERRING DIAG: R26.9 (ICD-10-CM) - Abnormal gait  THERAPY DIAG:  Generalized weakness  Unsteadiness on feet  Abnormality of gait  Rationale for Evaluation and Treatment: Rehabilitation  SUBJECTIVE:  SUBJECTIVE STATEMENT:  Pt reports she is doing well. No new reports since last session.   PERTINENT HISTORY:  Patient reports she had previously went to [facility] to see Ortho and was referred for physical therapy, but due to logistical errors she did not start PT at that time.  Pt states these gait deviations started years ago. Reports no injury at the time they started. Pt states she has always walked a lot for exercise, but it is hard for her to do now. Pt states she has noticed her gait deviations because she has started getting L thigh pain, but otherwise does not have pain. Pt states these deviations impact her balance. Per MD note on 12/13/2023: Trendelenburg gait due to hip weakness Trendelenburg gait due to hip weakness identified. Previous recommendation for physical therapy was not followed through due to logistical issues. - Refer to physical  therapy for hip strengthening exercises. PMH: Rheumatoid arthritis, obesity, prediabetes, HTN.   PAIN:  Are you having pain? None related to hip weakness, but will have pain in hands when overusing them due to RA  PRECAUTIONS: None  WEIGHT BEARING RESTRICTIONS: No  FALLS: Has patient fallen in last 6 months? Yes. Number of falls 1x tripped backwards over her cat  LIVING ENVIRONMENT: Lives with: lives with their spouse and lives with their son (husband and special needs son) Lives in: House/apartment - 1 level Stairs: Yes: External: 1 steps; on left going up Has following equipment at home: shower chair and Grab bars  PLOF: Independent, Independent with household mobility without device, and Independent with homemaking with ambulation  PATIENT GOALS: be able to walk with a normal gait  OBJECTIVE:  Note: Objective measures were completed at Evaluation unless otherwise noted.  FUNCTIONAL TESTS:  5 times sit to stand: 12.97 seconds 6 minute walk test: need to assess 10 meter walk test: 1.77m/s without AD Berg Balance Scale: need to assess Functional gait assessment: need to assess                                                                                                                             TREATMENT DATE: 04/03/2024  TA/TE/NMR -lateral stepping at // bars c GTB x90sec  -Standing hip ext with GTB 2 x 10 each LE  -lateral stepping at // bars c GTB x90sec  - double leg press with eccentric lowering phase with 40 # 2 x 10 reps  -STS from chair from knees x10  -SLS balance practice, alternating sides for 3 minutes today, UE support as needed alternating ea side every 30 sec  -Sumo squat 2 x 10  -wide tandem 2 x 30 sec  -More narrow wide tandem x 45 sec       PATIENT EDUCATION: Education details: Pt educated throughout session about proper posture and technique with exercises. Improved exercise technique, movement at target joints, use of target muscles after min to  mod verbal, visual, tactile cues. Person educated: Patient  Education method: Chief Technology Officer Education comprehension: verbalized understanding and needs further education  HOME EXERCISE PROGRAM:  Access Code: [email or web address] URL: [email or web address] Date: 02/09/2024 Prepared by: Lonni Gainer  Exercises - Supine Bridge with Resistance Band  - 1 x daily - 7 x weekly - 2 sets - 15 reps - Marching Bridge  - 1 x daily - 7 x weekly - 2 sets - 10 reps - Supine Hip Adduction Isometric with Ball  - 1 x daily - 7 x weekly - 2 sets - 10 reps - 3-5 sec  hold - Standing Side Plank on Wall  - 1 x daily - 7 x weekly - 3 sets - 30 second  hold - Side Stepping with Resistance at Thighs and Counter Support  - 1 x daily - 7 x weekly - 2-3 sets - 10 reps - Modified Bird Dog At Wall  - 1 x daily - 7 x weekly - 2 sets - 10 reps - Lateral Step Up with Counter Support  - 1 x daily - 7 x weekly - 2 sets - 10 reps  GOALS: Goals reviewed with patient? Yes  SHORT TERM GOALS: Target date: 03/08/2024  Patient will be independent in home exercise program to improve strength/mobility for better functional independence with ADLs.  Baseline: 12/15/2023 initial HEP provided 6/17: pt completing regularly Goal status: MET  LONG TERM GOALS: Target date: 03/27/2024  Patient will increase Berg Balance score to > 51/56 to demonstrate improved balance and decreased fall risk during functional activities and ADLs.  Baseline: 55 Goal status: MET  2.  Patient will increase six minute walk test distance to >1029ft for progression to community ambulator and improve gait ability  Baseline: 1080 ft  Goal status: MET  3.  Patient will increase Functional Gait Assessment (FGA) score to >20/30 as to reduce fall risk and improve dynamic gait safety with community ambulation.  Baseline: 23 Goal status: MET  4.  Patient will maintain 30 seconds of single leg stance on each LE without loss of balance  indicating increased hip strength and improved ability to ambulate and navigate stairs without trendelenburg pattern. Baseline: L LE: 5 seconds, R LE: 0 seconds  6/17: 17 sec, 3-4 sec 03/29/24: L LE: 25; R LE: 4 Goal status: ONGOING  5.  Patient will increase BLE gross strength to 4+/5 as to improve functional strength for independent gait, increased standing tolerance and increased ADL ability.  Baseline: see above Goal status: ONGOING  7.  Patient will improve lower extremity functional scale by 10 points or greater in order to indicate improved function as it relates to her lower extremity weakness. Baseline: 50% 03/29/24: 75% Goal status: MET  8.  Patient will report and demonstrate little to no difficulty when ascending 10 stairs or more in order to improve her safety and confidence with community ambulation and accessing her environment. Baseline: moderate difficulty 03/29/24: moderate difficulty Goal status: IN PROGRESS  9.  Patient will increase Mini-BESTest score by >/= 6 points to demonstrate decreased fall risk during functional activities. Baseline: 21/28 Goal status: INITIAL      ASSESSMENT:  CLINICAL IMPRESSION:     Patient arrived with good motivation for completion of pt activities. Pt continues with functional hip strengthening and static balance activities this date. Pt gait is improving and balance improving as well. Pt will continue to benefit from skilled physical therapy intervention to address impairments, improve QOL, and attain therapy goals.    OBJECTIVE IMPAIRMENTS: Abnormal gait, decreased activity  tolerance, decreased balance, decreased endurance, decreased knowledge of condition, decreased mobility, difficulty walking, decreased strength, improper body mechanics, and pain.   ACTIVITY LIMITATIONS: carrying, lifting, bending, standing, squatting, stairs, transfers, dressing, locomotion level, and caring for others  PARTICIPATION LIMITATIONS: cleaning,  laundry, shopping, and community activity  PERSONAL FACTORS: Age, Sex, Time since onset of injury/illness/exacerbation, and 3+ comorbidities: Rheumatoid arthritis, obesity, prediabetes, HTN are also affecting patient's functional outcome.   REHAB POTENTIAL: Good  CLINICAL DECISION MAKING: Stable/uncomplicated  EVALUATION COMPLEXITY: Low  PLAN:  PT FREQUENCY: 1-2x/week  PT DURATION: 12 weeks  PLANNED INTERVENTIONS: 97164- PT Re-evaluation, 97750- Physical Performance Testing, 97110-Therapeutic exercises, 97530- Therapeutic activity, [id number]- Neuromuscular re-education, 97535- Self Care, [id number]- Manual therapy, [id number]- Gait training, [phone]- Orthotic Initial, [phone]- Orthotic/Prosthetic subsequent, [phone]- Canalith repositioning, [phone]- Electrical stimulation (manual), Patient/Family education, Balance training, Stair training, Taping, Dry Needling, Joint mobilization, Joint manipulation, Spinal mobilization, Vestibular training, DME instructions, Cryotherapy, Moist heat, and Biofeedback  PLAN FOR NEXT SESSION:   Working on R LE single leg stance time Continue on LT goal progress    Note: Portions of this document were prepared using Conservation officer, historic buildings and although reviewed may contain unintentional dictation errors in syntax, grammar, or spelling.  Lonni KATHEE Gainer PT ,DPT Physical Therapist- [facility]   04/03/24, 2:06 PM

## 2024-04-05 ENCOUNTER — Ambulatory Visit: Payer: PRIVATE HEALTH INSURANCE | Admitting: Physical Therapy

## 2024-04-05 DIAGNOSIS — R2681 Unsteadiness on feet: Secondary | ICD-10-CM

## 2024-04-05 DIAGNOSIS — R531 Weakness: Secondary | ICD-10-CM | POA: Diagnosis not present

## 2024-04-05 DIAGNOSIS — R269 Unspecified abnormalities of gait and mobility: Secondary | ICD-10-CM

## 2024-04-05 NOTE — Therapy (Signed)
 OUTPATIENT PHYSICAL THERAPY TREATMENT  Patient Name: Rita Bailey MRN: [id number] DOB:1951/05/11, 73 y.o., female Today's Date: 04/05/2024  PCP: Myrla Jon HERO, MD REFERRING PROVIDER: Myrla Jon HERO, MD   END OF SESSION:   PT End of Session - 04/05/24 1320     Visit Number 21    Number of Visits 35    Date for PT Re-Evaluation 05/24/24    Progress Note Due on Visit 30    PT Start Time 1320    PT Stop Time 1400    PT Time Calculation (min) 40 min    Equipment Utilized During Treatment Gait belt    Activity Tolerance Patient tolerated treatment well    Behavior During Therapy WFL for tasks assessed/performed           Past Medical History:  Diagnosis Date   Allergy    Arthritis    Hypertension    Scoliosis of lumbosacral spine    Past Surgical History:  Procedure Laterality Date   CESAREAN SECTION     COLONOSCOPY WITH PROPOFOL  N/A 05/11/2017   Procedure: COLONOSCOPY WITH PROPOFOL ;  Surgeon: Dessa Reyes ORN, MD;  Location: [facility] END[facility]COPY;  Service: Endoscopy;  Laterality: N/A;   COLONOSCOPY WITH PROPOFOL  N/A 12/16/2021   Procedure: COLONOSCOPY WITH PROPOFOL ;  Surgeon: Dessa Reyes ORN, MD;  Location: [facility] END[facility];  Service: Endoscopy;  Laterality: N/A;   DILATATION & CURETTAGE/HYSTEROSCOPY WITH MYOSURE N/A 02/27/2016   Procedure: DILATATION & CURETTAGE/HYSTEROSCOPY WITH MYOSURE;  Surgeon: Debby JINNY Dinsmore, MD;  Location: [facility] ORS;  Service: Gynecology;  Laterality: N/A;   ESOPHAGOGASTRODUODENOSCOPY (EGD) WITH PROPOFOL  N/A 05/11/2017   Procedure: ESOPHAGOGASTRODUODENOSCOPY (EGD) WITH PROPOFOL ;  Surgeon: Dessa Reyes ORN, MD;  Location: [facility] ENDOSCOPY;  Service: Endoscopy;  Laterality: N/A;   sebaceous syst removed  08/24/2011   tonsillectomy     TONSILLECTOMY     Patient Active Problem List   Diagnosis Date Noted   Rheumatoid arthritis (HCC) 05/30/2023   Bilateral hand swelling 12/28/2022   Eczema 04/13/2018   Irritable bowel syndrome with  diarrhea 04/13/2018   Prediabetes 04/13/2018   PMB (postmenopausal bleeding) 10/05/2017   Gastro-esophageal reflux disease with esophagitis 05/13/2017   Strain of muscle of right hip 05/06/2017   Bilateral hearing loss 04/12/2017   Scoliosis of lumbosacral spine 03/30/2017   Abnormality of gait and mobility 03/30/2017   Allergic rhinitis 09/08/2015   Hypertension 09/08/2015   Gonalgia 09/08/2015   RAD (reactive airway disease) 09/08/2015   Obesity 03/12/2015   Current tear of meniscus 01/02/2015   Tear of meniscus of knee 01/02/2015    ONSET DATE: Years ago REFERRING DIAG: R26.9 (ICD-10-CM) - Abnormal gait  THERAPY DIAG:  Generalized weakness  Unsteadiness on feet  Abnormality of gait  Rationale for Evaluation and Treatment: Rehabilitation  SUBJECTIVE:  SUBJECTIVE STATEMENT:  Pt states she has just been running today. Pt states she has been walking around the [employer] store today doing a lot of walking. Pt states yesterday she tripped over something in the bathroom, but she was able to use a wall to catch herself to prevent a fall. States at the end of next week she is planning to go to the beach for a couple of weeks.    PERTINENT HISTORY:  Patient reports she had previously went to [facility] to see Ortho and was referred for physical therapy, but due to logistical errors she did not start PT at that time.  Pt states these gait deviations started years ago. Reports no injury at the time they started. Pt states she has always walked a lot for exercise, but it is hard for her to do now. Pt states she has noticed her gait deviations because she has started getting L thigh pain, but otherwise does not have pain. Pt states these deviations impact her balance. Per MD note on 12/13/2023:  Trendelenburg gait due to hip weakness Trendelenburg gait due to hip weakness identified. Previous recommendation for physical therapy was not followed through due to logistical issues. - Refer to physical therapy for hip strengthening exercises. PMH: Rheumatoid arthritis, obesity, prediabetes, HTN.   PAIN:  Are you having pain? None related to hip weakness, but will have pain in hands when overusing them due to RA  PRECAUTIONS: None  WEIGHT BEARING RESTRICTIONS: No  FALLS: Has patient fallen in last 6 months? Yes. Number of falls 1x tripped backwards over her cat  LIVING ENVIRONMENT: Lives with: lives with their spouse and lives with their son (husband and special needs son) Lives in: House/apartment - 1 level Stairs: Yes: External: 1 steps; on left going up Has following equipment at home: shower chair and Grab bars  PLOF: Independent, Independent with household mobility without device, and Independent with homemaking with ambulation  PATIENT GOALS: be able to walk with a normal gait  OBJECTIVE:  Note: Objective measures were completed at Evaluation unless otherwise noted.  FUNCTIONAL TESTS:  5 times sit to stand: 12.97 seconds 6 minute walk test: need to assess 10 meter walk test: 1.75m/s without AD Berg Balance Scale: need to assess Functional gait assessment: need to assess                                                                                                                             TREATMENT DATE: 04/05/2024  Participated in the following LE functional strengthening interventions to improve R hip stability during stance phase of gait:  Lateral stepping at // bars c GTB x4 laps Cuing not to drag R foot during  Standing hip ext with B UE support on // bars GTB 2 x 10-15 each LE  Cuing to keep R hip abductors activated while kicking L LE to improve R stance control Sumo squats  X12reps  5lb dumbbell at  chest x12reps  Standing fire hydrants with B UE  support on // bars RTB 2x 10reps per side Cuing to prevent hip drop when standing on R LE and lifting L LE Double leg press with eccentric lowering phase using R LE only 40 # x 10 reps  Added YTB resistance pulling R knee into hip adduction during 2nd set of 10reps Single leg press on machine 25lbs with YTB resistance pulling into hip adduction x10reps Pt reports this as very challenging  Gait ~122ft for active rest break and to allow muscle fatigue time to recover  SLS stance in // bars x30sec each side Pt immediately has hip drop during R stance due to muscle fatigue at end of session Requires intermittent UE support on bar, more frequent during R stance   PATIENT EDUCATION: Education details: Pt educated throughout session about proper posture and technique with exercises. Improved exercise technique, movement at target joints, use of target muscles after min to mod verbal, visual, tactile cues. Person educated: Patient Education method: Explanation and Handouts Education comprehension: verbalized understanding and needs further education  HOME EXERCISE PROGRAM:  Access Code: [email or web address] URL: [email or web address] Date: 02/09/2024 Prepared by: Lonni Gainer  Exercises - Supine Bridge with Resistance Band  - 1 x daily - 7 x weekly - 2 sets - 15 reps - Marching Bridge  - 1 x daily - 7 x weekly - 2 sets - 10 reps - Supine Hip Adduction Isometric with Ball  - 1 x daily - 7 x weekly - 2 sets - 10 reps - 3-5 sec  hold - Standing Side Plank on Wall  - 1 x daily - 7 x weekly - 3 sets - 30 second  hold - Side Stepping with Resistance at Thighs and Counter Support  - 1 x daily - 7 x weekly - 2-3 sets - 10 reps - Modified Bird Dog At Wall  - 1 x daily - 7 x weekly - 2 sets - 10 reps - Lateral Step Up with Counter Support  - 1 x daily - 7 x weekly - 2 sets - 10 reps  GOALS: Goals reviewed with patient? Yes  SHORT TERM GOALS: Target date: 03/08/2024  Patient will be  independent in home exercise program to improve strength/mobility for better functional independence with ADLs.  Baseline: 12/15/2023 initial HEP provided 6/17: pt completing regularly Goal status: MET  LONG TERM GOALS: Target date: 05/24/24   Patient will increase Berg Balance score to > 51/56 to demonstrate improved balance and decreased fall risk during functional activities and ADLs.  Baseline: 55 Goal status: MET  2.  Patient will increase six minute walk test distance to >1059ft for progression to community ambulator and improve gait ability  Baseline: 1080 ft  Goal status: MET  3.  Patient will increase Functional Gait Assessment (FGA) score to >20/30 as to reduce fall risk and improve dynamic gait safety with community ambulation.  Baseline: 23 Goal status: MET  4.  Patient will maintain 30 seconds of single leg stance on each LE without loss of balance indicating increased hip strength and improved ability to ambulate and navigate stairs without trendelenburg pattern. Baseline: L LE: 5 seconds, R LE: 0 seconds  6/17: 17 sec, 3-4 sec 03/29/24: L LE: 25; R LE: 4 Goal status: ONGOING  5.  Patient will increase BLE gross strength to 4+/5 as to improve functional strength for independent gait, increased standing tolerance and increased ADL ability.  Baseline: see above Goal status: ONGOING  7.  Patient will improve lower extremity functional scale by 10 points or greater in order to indicate improved function as it relates to her lower extremity weakness. Baseline: 50% 03/29/24: 75% Goal status: MET  8.  Patient will report and demonstrate little to no difficulty when ascending 10 stairs or more in order to improve her safety and confidence with community ambulation and accessing her environment. Baseline: moderate difficulty 03/29/24: moderate difficulty Goal status: IN PROGRESS  9.  Patient will increase Mini-BESTest score by >/= 6 points to demonstrate decreased fall risk  during functional activities. Baseline: 21/28 Goal status: INITIAL      ASSESSMENT:  CLINICAL IMPRESSION:    Patient arrived with good motivation for completion of pt activities. Pt continues with functional hip strengthening and static balance activities this date. Patient benefits from single leg strengthening exercise with use of leg press machine and addition of resistance band pulling into hip adduction to promote increased hip abductor activation. Patient demonstrates decreasing trendelenburg gait pattern overall since initial eval and pt reports she feels she is ~65% better at this time. Pt gait is improving and balance improving as well. Pt will continue to benefit from skilled physical therapy intervention to address impairments, improve QOL, and attain therapy goals.    OBJECTIVE IMPAIRMENTS: Abnormal gait, decreased activity tolerance, decreased balance, decreased endurance, decreased knowledge of condition, decreased mobility, difficulty walking, decreased strength, improper body mechanics, and pain.   ACTIVITY LIMITATIONS: carrying, lifting, bending, standing, squatting, stairs, transfers, dressing, locomotion level, and caring for others  PARTICIPATION LIMITATIONS: cleaning, laundry, shopping, and community activity  PERSONAL FACTORS: Age, Sex, Time since onset of injury/illness/exacerbation, and 3+ comorbidities: Rheumatoid arthritis, obesity, prediabetes, HTN are also affecting patient's functional outcome.   REHAB POTENTIAL: Good  CLINICAL DECISION MAKING: Stable/uncomplicated  EVALUATION COMPLEXITY: Low  PLAN:  PT FREQUENCY: 1-2x/week  PT DURATION: 12 weeks  PLANNED INTERVENTIONS: 97164- PT Re-evaluation, 97750- Physical Performance Testing, 97110-Therapeutic exercises, 97530- Therapeutic activity, [id number]- Neuromuscular re-education, 97535- Self Care, 02859- Manual therapy, [id number]- Gait training, [phone]- Orthotic Initial, [phone]- Orthotic/Prosthetic subsequent, [phone]-  Canalith repositioning, [phone]- Electrical stimulation (manual), Patient/Family education, Balance training, Stair training, Taping, Dry Needling, Joint mobilization, Joint manipulation, Spinal mobilization, Vestibular training, DME instructions, Cryotherapy, Moist heat, and Biofeedback  PLAN FOR NEXT SESSION:  Continue R LE single leg stance time and functional R LE hip abductor strengthening in WBing, hip extended position Continue on LT goal progress   Kittie Krizan, PT, DPT, NCS, CSRS Physical Therapist - [facility]  [facility]  2:03 PM 04/05/24

## 2024-04-09 ENCOUNTER — Ambulatory Visit

## 2024-04-09 DIAGNOSIS — R269 Unspecified abnormalities of gait and mobility: Secondary | ICD-10-CM

## 2024-04-09 DIAGNOSIS — R2681 Unsteadiness on feet: Secondary | ICD-10-CM

## 2024-04-09 DIAGNOSIS — R531 Weakness: Secondary | ICD-10-CM | POA: Diagnosis not present

## 2024-04-09 NOTE — Therapy (Signed)
 OUTPATIENT PHYSICAL THERAPY TREATMENT  Patient Name: Rita Bailey MRN: [id number] DOB:09/02/1950, 73 y.o., female Today's Date: 04/09/2024  PCP: Myrla Jon HERO, MD REFERRING PROVIDER: Myrla Jon HERO, MD   END OF SESSION:   PT End of Session - 04/09/24 1407     Visit Number 22    Number of Visits 35    Date for PT Re-Evaluation 05/24/24    Progress Note Due on Visit 30    PT Start Time 1400    PT Stop Time 1440    PT Time Calculation (min) 40 min    Equipment Utilized During Treatment Gait belt    Activity Tolerance Patient tolerated treatment well    Behavior During Therapy WFL for tasks assessed/performed           Past Medical History:  Diagnosis Date   Allergy    Arthritis    Hypertension    Scoliosis of lumbosacral spine    Past Surgical History:  Procedure Laterality Date   CESAREAN SECTION     COLONOSCOPY WITH PROPOFOL  N/A 05/11/2017   Procedure: COLONOSCOPY WITH PROPOFOL ;  Surgeon: Dessa Reyes ORN, MD;  Location: [facility];  Service: Endoscopy;  Laterality: N/A;   COLONOSCOPY WITH PROPOFOL  N/A 12/16/2021   Procedure: COLONOSCOPY WITH PROPOFOL ;  Surgeon: Dessa Reyes ORN, MD;  Location: [facility];  Service: Endoscopy;  Laterality: N/A;   DILATATION & CURETTAGE/HYSTEROSCOPY WITH MYOSURE N/A 02/27/2016   Procedure: DILATATION & CURETTAGE/HYSTEROSCOPY WITH MYOSURE;  Surgeon: Debby JINNY Dinsmore, MD;  Location: [facility];  Service: Gynecology;  Laterality: N/A;   ESOPHAGOGASTRODUODENOSCOPY (EGD) WITH PROPOFOL  N/A 05/11/2017   Procedure: ESOPHAGOGASTRODUODENOSCOPY (EGD) WITH PROPOFOL ;  Surgeon: Dessa Reyes ORN, MD;  Location: [facility];  Service: Endoscopy;  Laterality: N/A;   sebaceous syst removed  08/24/2011   tonsillectomy     TONSILLECTOMY     Patient Active Problem List   Diagnosis Date Noted   Rheumatoid arthritis (HCC) 05/30/2023   Bilateral hand swelling 12/28/2022   Eczema 04/13/2018   Irritable bowel syndrome with  diarrhea 04/13/2018   Prediabetes 04/13/2018   PMB (postmenopausal bleeding) 10/05/2017   Gastro-esophageal reflux disease with esophagitis 05/13/2017   Strain of muscle of right hip 05/06/2017   Bilateral hearing loss 04/12/2017   Scoliosis of lumbosacral spine 03/30/2017   Abnormality of gait and mobility 03/30/2017   Allergic rhinitis 09/08/2015   Hypertension 09/08/2015   Gonalgia 09/08/2015   RAD (reactive airway disease) 09/08/2015   Obesity 03/12/2015   Current tear of meniscus 01/02/2015   Tear of meniscus of knee 01/02/2015    ONSET DATE: Years ago REFERRING DIAG: R26.9 (ICD-10-CM) - Abnormal gait  THERAPY DIAG:  Generalized weakness  Unsteadiness on feet  Abnormality of gait  Rationale for Evaluation and Treatment: Rehabilitation  SUBJECTIVE:  SUBJECTIVE STATEMENT: No notable updates from weekend. Pt doing well today.   PERTINENT HISTORY:  Patient reports she had previously went to [facility] to see Ortho and was referred for physical therapy, but due to logistical errors she did not start PT at that time.  Pt states these gait deviations started years ago. Reports no injury at the time they started. Pt states she has always walked a lot for exercise, but it is hard for her to do now. Pt states she has noticed her gait deviations because she has started getting L thigh pain, but otherwise does not have pain. Pt states these deviations impact her balance. Per MD note on 12/13/2023: Trendelenburg gait due to hip weakness. Trendelenburg gait due to hip weakness identified. Previous recommendation for physical therapy was not followed through due to logistical issues.  Refer to physical therapy for hip strengthening exercises. PMH: Rheumatoid arthritis, obesity, prediabetes, HTN.   PAIN:   Are you having pain? None PRECAUTIONS: None WEIGHT BEARING RESTRICTIONS: No FALLS: Has patient fallen in last 6 months? Yes. Number of falls 1x tripped backwards over her cat  LIVING ENVIRONMENT: Lives with: lives with their spouse and lives with their son (husband and special needs son) Lives in: House/apartment - 1 level Stairs: Yes: External: 1 steps; on left going up Has following equipment at home: shower chair and Grab bars  PLOF: Independent, Independent with household mobility without device, and Independent with homemaking with ambulation  PATIENT GOALS: be able to walk with a normal gait  OBJECTIVE:  Note: Objective measures were completed at Evaluation unless otherwise noted.                                                                                                                            TREATMENT DATE: 04/09/2024 -side stepping in // bars GTB below knees 4x bilat -side stepping in // bars GTB below knees 4x bilat -standing hip extension GTB below knees 1x15 bilat  -standing marching, GTB below knees 1x20  -seated band clam GTB 1x15  -standing hip extension GTB below knees 1x15 bilat  -standing marching, GTB below knees  -seated band clam GTB 1x15  -tandem stance balance on 2x4 5x30secH bilat  -narrow stance on foam 2x60secH    PATIENT EDUCATION: Education details: Pt educated throughout session about proper posture and technique with exercises. Improved exercise technique, movement at target joints, use of target muscles after min to mod verbal, visual, tactile cues. Person educated: Patient Education method: Explanation and Handouts Education comprehension: verbalized understanding and needs further education  HOME EXERCISE PROGRAM: Access Code: [email or web address] URL: [email or web address] Date: 02/09/2024 Prepared by: Lonni Gainer  Exercises - Supine Bridge with Resistance Band  - 1 x daily - 7 x weekly - 2 sets - 15 reps - Marching Bridge   - 1 x daily - 7 x weekly - 2 sets - 10 reps - Supine Hip Adduction Isometric with Ball  - 1 x daily -  7 x weekly - 2 sets - 10 reps - 3-5 sec  hold - Standing Side Plank on Wall  - 1 x daily - 7 x weekly - 3 sets - 30 second  hold - Side Stepping with Resistance at Thighs and Counter Support  - 1 x daily - 7 x weekly - 2-3 sets - 10 reps - Modified Bird Dog At Wall  - 1 x daily - 7 x weekly - 2 sets - 10 reps - Lateral Step Up with Counter Support  - 1 x daily - 7 x weekly - 2 sets - 10 reps  GOALS: Goals reviewed with patient? Yes  SHORT TERM GOALS: Target date: 03/08/2024 Patient will be independent in home exercise program to improve strength/mobility for better functional independence with ADLs.  Baseline: 12/15/2023 initial HEP provided 6/17: pt completing regularly Goal status: MET  LONG TERM GOALS: Target date: 05/24/24  Patient will increase Berg Balance score to > 51/56 to demonstrate improved balance and decreased fall risk during functional activities and ADLs.  Baseline: 55 Goal status: MET  2.  Patient will increase six minute walk test distance to >1095ft for progression to community ambulator and improve gait ability  Baseline: 1080 ft  Goal status: MET  3.  Patient will increase Functional Gait Assessment (FGA) score to >20/30 as to reduce fall risk and improve dynamic gait safety with community ambulation.  Baseline: 23 Goal status: MET  4.  Patient will maintain 30 seconds of single leg stance on each LE without loss of balance indicating increased hip strength and improved ability to ambulate and navigate stairs without trendelenburg pattern. Baseline: L LE: 5 seconds, R LE: 0 seconds  6/17: 17 sec, 3-4 sec 03/29/24: L LE: 25; R LE: 4 Goal status: ONGOING  5.  Patient will increase BLE gross strength to 4+/5 as to improve functional strength for independent gait, increased standing tolerance and increased ADL ability.  Baseline: see above Goal status: ONGOING  7.   Patient will improve lower extremity functional scale by 10 points or greater in order to indicate improved function as it relates to her lower extremity weakness. Baseline: 50% 03/29/24: 75% Goal status: MET  8.  Patient will report and demonstrate little to no difficulty when ascending 10 stairs or more in order to improve her safety and confidence with community ambulation and accessing her environment. Baseline: moderate difficulty 03/29/24: moderate difficulty Goal status: IN PROGRESS  9.  Patient will increase Mini-BESTest score by >/= 6 points to demonstrate decreased fall risk during functional activities. Baseline: 21/28 Goal status: INITIAL  ASSESSMENT:  CLINICAL IMPRESSION:    Continued with hip strength interventions. Pt remains more fatigable in Right hip. No updates to HEP at this time. Pt will continue to benefit from skilled physical therapy intervention to address impairments, improve QOL, and attain therapy goals.   OBJECTIVE IMPAIRMENTS: Abnormal gait, decreased activity tolerance, decreased balance, decreased endurance, decreased knowledge of condition, decreased mobility, difficulty walking, decreased strength, improper body mechanics, and pain.  ACTIVITY LIMITATIONS: carrying, lifting, bending, standing, squatting, stairs, transfers, dressing, locomotion level, and caring for others PARTICIPATION LIMITATIONS: cleaning, laundry, shopping, and community activity PERSONAL FACTORS: Age, Sex, Time since onset of injury/illness/exacerbation, and 3+ comorbidities: Rheumatoid arthritis, obesity, prediabetes, HTN are also affecting patient's functional outcome.  REHAB POTENTIAL: Good CLINICAL DECISION MAKING: Stable/uncomplicated EVALUATION COMPLEXITY: Low  PLAN:  PT FREQUENCY: 1-2x/week PT DURATION: 12 weeks PLANNED INTERVENTIONS: 97164- PT Re-evaluation, 97750- Physical Performance Testing, 97110-Therapeutic exercises, 97530- Therapeutic  activity, [id number]- Neuromuscular  re-education, [phone]- Self Care, 02859- Manual therapy, [phone]- Gait training, [phone]- Orthotic Initial, [phone]- Orthotic/Prosthetic subsequent, [phone]- Canalith repositioning, [phone]- Electrical stimulation (manual), Patient/Family education, Balance training, Stair training, Taping, Dry Needling, Joint mobilization, Joint manipulation, Spinal mobilization, Vestibular training, DME instructions, Cryotherapy, Moist heat, and Biofeedback  PLAN FOR NEXT SESSION:    2:10 PM, 04/09/24 Peggye JAYSON Linear, PT, DPT Physical Therapist - [place] [facility]  Outpatient Physical Therapy- Main [facility] [phone]

## 2024-04-10 ENCOUNTER — Ambulatory Visit: Payer: PRIVATE HEALTH INSURANCE | Admitting: Physical Therapy

## 2024-04-11 ENCOUNTER — Encounter: Payer: Self-pay | Admitting: Physical Therapy

## 2024-04-11 ENCOUNTER — Ambulatory Visit: Admitting: Physical Therapy

## 2024-04-11 DIAGNOSIS — R531 Weakness: Secondary | ICD-10-CM

## 2024-04-11 DIAGNOSIS — R269 Unspecified abnormalities of gait and mobility: Secondary | ICD-10-CM

## 2024-04-11 DIAGNOSIS — R2681 Unsteadiness on feet: Secondary | ICD-10-CM

## 2024-04-11 NOTE — Therapy (Signed)
 OUTPATIENT PHYSICAL THERAPY TREATMENT  Patient Name: Rita Bailey MRN: [id number] DOB:12-Jul-1951, 73 y.o., female Today's Date: 04/11/2024  PCP: Myrla Jon HERO, MD REFERRING PROVIDER: Myrla Jon HERO, MD   END OF SESSION:   PT End of Session - 04/11/24 1446     Visit Number 23    Number of Visits 35    Date for PT Re-Evaluation 05/24/24    Progress Note Due on Visit 30    PT Start Time 1446    PT Stop Time 1525    PT Time Calculation (min) 39 min    Equipment Utilized During Treatment Gait belt    Activity Tolerance Patient tolerated treatment well    Behavior During Therapy WFL for tasks assessed/performed           Past Medical History:  Diagnosis Date   Allergy    Arthritis    Hypertension    Scoliosis of lumbosacral spine    Past Surgical History:  Procedure Laterality Date   CESAREAN SECTION     COLONOSCOPY WITH PROPOFOL  N/A 05/11/2017   Procedure: COLONOSCOPY WITH PROPOFOL ;  Surgeon: Dessa Reyes ORN, MD;  Location: [facility];  Service: Endoscopy;  Laterality: N/A;   COLONOSCOPY WITH PROPOFOL  N/A 12/16/2021   Procedure: COLONOSCOPY WITH PROPOFOL ;  Surgeon: Dessa Reyes ORN, MD;  Location: [facility];  Service: Endoscopy;  Laterality: N/A;   DILATATION & CURETTAGE/HYSTEROSCOPY WITH MYOSURE N/A 02/27/2016   Procedure: DILATATION & CURETTAGE/HYSTEROSCOPY WITH MYOSURE;  Surgeon: Debby JINNY Dinsmore, MD;  Location: [facility];  Service: Gynecology;  Laterality: N/A;   ESOPHAGOGASTRODUODENOSCOPY (EGD) WITH PROPOFOL  N/A 05/11/2017   Procedure: ESOPHAGOGASTRODUODENOSCOPY (EGD) WITH PROPOFOL ;  Surgeon: Dessa Reyes ORN, MD;  Location: [facility];  Service: Endoscopy;  Laterality: N/A;   sebaceous syst removed  08/24/2011   tonsillectomy     TONSILLECTOMY     Patient Active Problem List   Diagnosis Date Noted   Rheumatoid arthritis (HCC) 05/30/2023   Bilateral hand swelling 12/28/2022   Eczema 04/13/2018   Irritable bowel syndrome with  diarrhea 04/13/2018   Prediabetes 04/13/2018   PMB (postmenopausal bleeding) 10/05/2017   Gastro-esophageal reflux disease with esophagitis 05/13/2017   Strain of muscle of right hip 05/06/2017   Bilateral hearing loss 04/12/2017   Scoliosis of lumbosacral spine 03/30/2017   Abnormality of gait and mobility 03/30/2017   Allergic rhinitis 09/08/2015   Hypertension 09/08/2015   Gonalgia 09/08/2015   RAD (reactive airway disease) 09/08/2015   Obesity 03/12/2015   Current tear of meniscus 01/02/2015   Tear of meniscus of knee 01/02/2015    ONSET DATE: Years ago REFERRING DIAG: R26.9 (ICD-10-CM) - Abnormal gait  THERAPY DIAG:  Generalized weakness  Unsteadiness on feet  Abnormality of gait  Rationale for Evaluation and Treatment: Rehabilitation  SUBJECTIVE:  SUBJECTIVE STATEMENT: No notable updates from weekend. Pt doing well today. [place] so she will be out for PT next week. Pt complains of knee area discomfort with TB around lower femur/ above knee.   PERTINENT HISTORY:  Patient reports she had previously went to [facility] to see Ortho and was referred for physical therapy, but due to logistical errors she did not start PT at that time.  Pt states these gait deviations started years ago. Reports no injury at the time they started. Pt states she has always walked a lot for exercise, but it is hard for her to do now. Pt states she has noticed her gait deviations because she has started getting L thigh pain, but otherwise does not have pain. Pt states these deviations impact her balance. Per MD note on 12/13/2023: Trendelenburg gait due to hip weakness. Trendelenburg gait due to hip weakness identified. Previous recommendation for physical therapy was not followed through due to  logistical issues.  Refer to physical therapy for hip strengthening exercises. PMH: Rheumatoid arthritis, obesity, prediabetes, HTN.   PAIN:  Are you having pain? None PRECAUTIONS: None WEIGHT BEARING RESTRICTIONS: No FALLS: Has patient fallen in last 6 months? Yes. Number of falls 1x tripped backwards over her cat  LIVING ENVIRONMENT: Lives with: lives with their spouse and lives with their son (husband and special needs son) Lives in: House/apartment - 1 level Stairs: Yes: External: 1 steps; on left going up Has following equipment at home: shower chair and Grab bars  PLOF: Independent, Independent with household mobility without device, and Independent with homemaking with ambulation  PATIENT GOALS: be able to walk with a normal gait  OBJECTIVE:  Note: Objective measures were completed at Evaluation unless otherwise noted.                                                                                                                            TREATMENT DATE: 04/11/2024 TA- To improve functional movements patterns for everyday tasks  Side step up with UE assist 2 x 10 ea LE  Superset sidestepping with RTB around ankles and hip extension with RTB around ankles 3 x 45 sec of ea of the exercises alternating  Leg press with R LE eccentric lowering 2 x 10 at 40 # , x 10 at 55#  Open and close the gate x 10 ea side with UE support   NMR: To facilitate reeducation of movement, balance, posture, coordination, and/or proprioception/kinesthetic sense.  On airex pad 3/4 romberg stance 3 x 45 sec    PATIENT EDUCATION: Education details: Pt educated throughout session about proper posture and technique with exercises. Improved exercise technique, movement at target joints, use of target muscles after min to mod verbal, visual, tactile cues. Person educated: Patient Education method: Explanation and Handouts Education comprehension: verbalized understanding and needs further  education  HOME EXERCISE PROGRAM: Access Code: [email or web address] URL: [email or web address] Date: 02/09/2024 Prepared by: Lonni Gainer  GOALS: Goals reviewed with patient? Yes  SHORT TERM GOALS: Target date: 03/08/2024 Patient will be independent in home exercise program to improve strength/mobility for better functional independence with ADLs.  Baseline: 12/15/2023 initial HEP provided 6/17: pt completing regularly Goal status: MET  LONG TERM GOALS: Target date: 05/24/24  Patient will increase Berg Balance score to > 51/56 to demonstrate improved balance and decreased fall risk during functional activities and ADLs.  Baseline: 55 Goal status: MET  2.  Patient will increase six minute walk test distance to >1082ft for progression to community ambulator and improve gait ability  Baseline: 1080 ft  Goal status: MET  3.  Patient will increase Functional Gait Assessment (FGA) score to >20/30 as to reduce fall risk and improve dynamic gait safety with community ambulation.  Baseline: 23 Goal status: MET  4.  Patient will maintain 30 seconds of single leg stance on each LE without loss of balance indicating increased hip strength and improved ability to ambulate and navigate stairs without trendelenburg pattern. Baseline: L LE: 5 seconds, R LE: 0 seconds  6/17: 17 sec, 3-4 sec 03/29/24: L LE: 25; R LE: 4 Goal status: ONGOING  5.  Patient will increase BLE gross strength to 4+/5 as to improve functional strength for independent gait, increased standing tolerance and increased ADL ability.  Baseline: see above Goal status: ONGOING  7.  Patient will improve lower extremity functional scale by 10 points or greater in order to indicate improved function as it relates to her lower extremity weakness. Baseline: 50% 03/29/24: 75% Goal status: MET  8.  Patient will report and demonstrate little to no difficulty when ascending 10 stairs or more in order to improve her safety and  confidence with community ambulation and accessing her environment. Baseline: moderate difficulty 03/29/24: moderate difficulty Goal status: IN PROGRESS  9.  Patient will increase Mini-BESTest score by >/= 6 points to demonstrate decreased fall risk during functional activities. Baseline: 21/28 Goal status: INITIAL  ASSESSMENT:  CLINICAL IMPRESSION:     Continued with current plan of care as laid out in evaluation and recent prior sessions. Pt remains motivated to advance progress toward goals in order to maximize independence and safety at home. Pt requires high level assistance and cuing for completion of exercises in order to provide adequate level of stimulation challenge while minimizing pain and discomfort when possible. Pt closely monitored throughout session pt response and to maximize patient safety during interventions. Pt continues to demonstrate progress toward goals AEB progression of interventions this date either in volume or intensity.   OBJECTIVE IMPAIRMENTS: Abnormal gait, decreased activity tolerance, decreased balance, decreased endurance, decreased knowledge of condition, decreased mobility, difficulty walking, decreased strength, improper body mechanics, and pain.   ACTIVITY LIMITATIONS: carrying, lifting, bending, standing, squatting, stairs, transfers, dressing, locomotion level, and caring for others  PARTICIPATION LIMITATIONS: cleaning, laundry, shopping, and community activity  PERSONAL FACTORS: Age, Sex, Time since onset of injury/illness/exacerbation, and 3+ comorbidities: Rheumatoid arthritis, obesity, prediabetes, HTN are also affecting patient's functional outcome.   REHAB POTENTIAL: Good  CLINICAL DECISION MAKING: Stable/uncomplicated  EVALUATION COMPLEXITY: Low  PLAN:  PT FREQUENCY: 1-2x/week PT DURATION: 12 weeks PLANNED INTERVENTIONS: 97164- PT Re-evaluation, 97750- Physical Performance Testing, 97110-Therapeutic exercises, 97530- Therapeutic  activity, [id number]- Neuromuscular re-education, 97535- Self Care, 02859- Manual therapy, [id number]- Gait training, [phone]- Orthotic Initial, [phone]- Orthotic/Prosthetic subsequent, [phone]- Canalith repositioning, [phone]- Electrical stimulation (manual), Patient/Family education, Balance training, Stair training, Taping, Dry Needling, Joint mobilization, Joint manipulation, Spinal mobilization, Vestibular training, DME instructions,  Cryotherapy, Moist heat, and Biofeedback  PLAN FOR NEXT SESSION:  Progress hip abduction strength and work on gait incorporating newly acquired strength and stability   3:02 PM, 04/11/24 Note: Portions of this document were prepared using Dragon voice recognition software and although reviewed may contain unintentional dictation errors in syntax, grammar, or spelling.  Lonni KATHEE Gainer PT ,DPT Physical Therapist- [place]  [facility]

## 2024-04-12 ENCOUNTER — Ambulatory Visit: Payer: PRIVATE HEALTH INSURANCE

## 2024-04-17 ENCOUNTER — Ambulatory Visit: Payer: PRIVATE HEALTH INSURANCE

## 2024-04-19 ENCOUNTER — Ambulatory Visit: Payer: PRIVATE HEALTH INSURANCE

## 2024-04-24 ENCOUNTER — Ambulatory Visit: Payer: PRIVATE HEALTH INSURANCE

## 2024-04-26 ENCOUNTER — Ambulatory Visit: Payer: PRIVATE HEALTH INSURANCE

## 2024-05-01 ENCOUNTER — Ambulatory Visit: Payer: PRIVATE HEALTH INSURANCE | Attending: Family Medicine | Admitting: Physical Therapy

## 2024-05-01 DIAGNOSIS — R2681 Unsteadiness on feet: Secondary | ICD-10-CM | POA: Insufficient documentation

## 2024-05-01 DIAGNOSIS — R531 Weakness: Secondary | ICD-10-CM | POA: Insufficient documentation

## 2024-05-01 DIAGNOSIS — R269 Unspecified abnormalities of gait and mobility: Secondary | ICD-10-CM | POA: Insufficient documentation

## 2024-05-01 NOTE — Therapy (Signed)
 OUTPATIENT PHYSICAL THERAPY TREATMENT  Patient Name: Rita Bailey MRN: [id number] DOB:[birth year], 73 y.o., female Today's Date: 05/01/2024  PCP: Myrla Jon HERO, MD REFERRING PROVIDER: Myrla Jon HERO, MD   END OF SESSION:   PT End of Session - 05/01/24 1450     Visit Number 24    Number of Visits 35    Date for PT Re-Evaluation 05/24/24    Progress Note Due on Visit 30    PT Start Time 1449    PT Stop Time 1529    PT Time Calculation (min) 40 min    Equipment Utilized During Treatment Gait belt    Activity Tolerance Patient tolerated treatment well    Behavior During Therapy WFL for tasks assessed/performed            Past Medical History:  Diagnosis Date   Allergy    Arthritis    Hypertension    Scoliosis of lumbosacral spine    Past Surgical History:  Procedure Laterality Date   CESAREAN SECTION     COLONOSCOPY WITH PROPOFOL  N/A 05/11/2017   Procedure: COLONOSCOPY WITH PROPOFOL ;  Surgeon: Dessa Reyes ORN, MD;  Location: [facility];  Service: Endoscopy;  Laterality: N/A;   COLONOSCOPY WITH PROPOFOL  N/A 12/16/2021   Procedure: COLONOSCOPY WITH PROPOFOL ;  Surgeon: Dessa Reyes ORN, MD;  Location: [facility];  Service: Endoscopy;  Laterality: N/A;   DILATATION & CURETTAGE/HYSTEROSCOPY WITH MYOSURE N/A 02/27/2016   Procedure: DILATATION & CURETTAGE/HYSTEROSCOPY WITH MYOSURE;  Surgeon: Debby JINNY Dinsmore, MD;  Location: [facility] ORS;  Service: Gynecology;  Laterality: N/A;   ESOPHAGOGASTRODUODENOSCOPY (EGD) WITH PROPOFOL  N/A 05/11/2017   Procedure: ESOPHAGOGASTRODUODENOSCOPY (EGD) WITH PROPOFOL ;  Surgeon: Dessa Reyes ORN, MD;  Location: [facility];  Service: Endoscopy;  Laterality: N/A;   sebaceous syst removed  08/24/2011   tonsillectomy     TONSILLECTOMY     Patient Active Problem List   Diagnosis Date Noted   Rheumatoid arthritis (HCC) 05/30/2023   Bilateral hand swelling 12/28/2022   Eczema 04/13/2018   Irritable bowel syndrome with  diarrhea 04/13/2018   Prediabetes 04/13/2018   PMB (postmenopausal bleeding) 10/05/2017   Gastro-esophageal reflux disease with esophagitis 05/13/2017   Strain of muscle of right hip 05/06/2017   Bilateral hearing loss 04/12/2017   Scoliosis of lumbosacral spine 03/30/2017   Abnormality of gait and mobility 03/30/2017   Allergic rhinitis 09/08/2015   Hypertension 09/08/2015   Gonalgia 09/08/2015   RAD (reactive airway disease) 09/08/2015   Obesity 03/12/2015   Current tear of meniscus 01/02/2015   Tear of meniscus of knee 01/02/2015    ONSET DATE: Years ago REFERRING DIAG: R26.9 (ICD-10-CM) - Abnormal gait  THERAPY DIAG:  Generalized weakness  Unsteadiness on feet  Abnormality of gait  Rationale for Evaluation and Treatment: Rehabilitation  SUBJECTIVE:  SUBJECTIVE STATEMENT: No notable updates from weekend. Pt doing well today. Recovering from illness last week, had a good trip to beach prior to that.  Was having a lot of soreness in her target musculature prior to beach trip but overall is feeling much more refreshed now.  She did get a chance to work on her home exercises at the beach.  PERTINENT HISTORY:  Patient reports she had previously went to [facility] to see Ortho and was referred for physical therapy, but due to logistical errors she did not start PT at that time.  Pt states these gait deviations started years ago. Reports no injury at the time they started. Pt states she has always walked a lot for exercise, but it is hard for her to do now. Pt states she has noticed her gait deviations because she has started getting L thigh pain, but otherwise does not have pain. Pt states these deviations impact her balance. Per MD note on 12/13/2023: Trendelenburg gait due to hip weakness.  Trendelenburg gait due to hip weakness identified. Previous recommendation for physical therapy was not followed through due to logistical issues.  Refer to physical therapy for hip strengthening exercises. PMH: Rheumatoid arthritis, obesity, prediabetes, HTN.   PAIN:  Are you having pain? None PRECAUTIONS: None WEIGHT BEARING RESTRICTIONS: No FALLS: Has patient fallen in last 6 months? Yes. Number of falls 1x tripped backwards over her cat  LIVING ENVIRONMENT: Lives with: lives with their spouse and lives with their son (husband and special needs son) Lives in: House/apartment - 1 level Stairs: Yes: External: 1 steps; on left going up Has following equipment at home: shower chair and Grab bars  PLOF: Independent, Independent with household mobility without device, and Independent with homemaking with ambulation  PATIENT GOALS: be able to walk with a normal gait  OBJECTIVE:  Note: Objective measures were completed at Evaluation unless otherwise noted.                                                                                                                            TREATMENT DATE: 05/01/2024 TA- To improve functional movements patterns for everyday tasks   Side step up with step trainer with UE assist 2 x 10 ea LE  Superset sidestepping with RTB around ankles and hip extension with RTB around ankles 2 x 10 of ea of the exercises alternating  Leg press with R LE eccentric lowering 2 x 10 at 40 #   -2x10 DL leg press @ 29#   NMR: To facilitate reeducation of movement, balance, posture, coordination, and/or proprioception/kinesthetic sense.  On airex pad 3/4 romberg stance right lower extremity posterior left lower extremity anterior 3 x 60 sec; therapeutic rest between sets  Gait training   Ladder drill 2 x 5 laps with cues for keeping feet at even spots in ladder   -placed ladder strategically so right foot could be in blue tiles on left foot could be an tan  tiles for  external cue for step with   PATIENT EDUCATION: Education details: Pt educated throughout session about proper posture and technique with exercises. Improved exercise technique, movement at target joints, use of target muscles after min to mod verbal, visual, tactile cues. Person educated: Patient Education method: Explanation and Handouts Education comprehension: verbalized understanding and needs further education  HOME EXERCISE PROGRAM: Access Code: [email or web address] URL: [email or web address] Date: 02/09/2024 Prepared by: Lonni Gainer    GOALS: Goals reviewed with patient? Yes  SHORT TERM GOALS: Target date: 03/08/2024 Patient will be independent in home exercise program to improve strength/mobility for better functional independence with ADLs.  Baseline: 12/15/2023 initial HEP provided 6/17: pt completing regularly Goal status: MET  LONG TERM GOALS: Target date: 05/24/24  Patient will increase Berg Balance score to > 51/56 to demonstrate improved balance and decreased fall risk during functional activities and ADLs.  Baseline: 55 Goal status: MET  2.  Patient will increase six minute walk test distance to >103ft for progression to community ambulator and improve gait ability  Baseline: 1080 ft  Goal status: MET  3.  Patient will increase Functional Gait Assessment (FGA) score to >20/30 as to reduce fall risk and improve dynamic gait safety with community ambulation.  Baseline: 23 Goal status: MET  4.  Patient will maintain 30 seconds of single leg stance on each LE without loss of balance indicating increased hip strength and improved ability to ambulate and navigate stairs without trendelenburg pattern. Baseline: L LE: 5 seconds, R LE: 0 seconds  6/17: 17 sec, 3-4 sec 03/29/24: L LE: 25; R LE: 4 Goal status: ONGOING  5.  Patient will increase BLE gross strength to 4+/5 as to improve functional strength for independent gait, increased standing tolerance and  increased ADL ability.  Baseline: see above Goal status: ONGOING  7.  Patient will improve lower extremity functional scale by 10 points or greater in order to indicate improved function as it relates to her lower extremity weakness. Baseline: 50% 03/29/24: 75% Goal status: MET  8.  Patient will report and demonstrate little to no difficulty when ascending 10 stairs or more in order to improve her safety and confidence with community ambulation and accessing her environment. Baseline: moderate difficulty 03/29/24: moderate difficulty Goal status: IN PROGRESS  9.  Patient will increase Mini-BESTest score by >/= 6 points to demonstrate decreased fall risk during functional activities. Baseline: 21/28 Goal status: INITIAL  ASSESSMENT:  CLINICAL IMPRESSION:     Continued with current plan of care as laid out in evaluation and recent prior sessions. Pt remains motivated to advance progress toward goals in order to maximize independence and safety at home. Pt requires high level assistance and cuing for completion of exercises in order to provide adequate level of stimulation challenge while minimizing pain and discomfort when possible. Pt closely monitored throughout session pt response and to maximize patient safety during interventions.  Patient progressed with gait training this date showing good ability to ambulate with even step with bilaterally but did require external cues.  Patient unable to consistently do this without looking down at feet but overall showing good progress with this at this time.  Pt continues to demonstrate progress toward goals AEB progression of interventions this date either in volume or intensity.   OBJECTIVE IMPAIRMENTS: Abnormal gait, decreased activity tolerance, decreased balance, decreased endurance, decreased knowledge of condition, decreased mobility, difficulty walking, decreased strength, improper body mechanics, and pain.   ACTIVITY LIMITATIONS: carrying,  lifting, bending, standing, squatting,  stairs, transfers, dressing, locomotion level, and caring for others  PARTICIPATION LIMITATIONS: cleaning, laundry, shopping, and community activity  PERSONAL FACTORS: Age, Sex, Time since onset of injury/illness/exacerbation, and 3+ comorbidities: Rheumatoid arthritis, obesity, prediabetes, HTN are also affecting patient's functional outcome.   REHAB POTENTIAL: Good  CLINICAL DECISION MAKING: Stable/uncomplicated  EVALUATION COMPLEXITY: Low  PLAN:  PT FREQUENCY: 1-2x/week PT DURATION: 12 weeks PLANNED INTERVENTIONS: 97164- PT Re-evaluation, 97750- Physical Performance Testing, 97110-Therapeutic exercises, 97530- Therapeutic activity, [id number]- Neuromuscular re-education, 97535- Self Care, [id number]- Manual therapy, [id number]- Gait training, [phone]- Orthotic Initial, [phone]- Orthotic/Prosthetic subsequent, [phone]- Canalith repositioning, [phone]- Electrical stimulation (manual), Patient/Family education, Balance training, Stair training, Taping, Dry Needling, Joint mobilization, Joint manipulation, Spinal mobilization, Vestibular training, DME instructions, Cryotherapy, Moist heat, and Biofeedback  PLAN FOR NEXT SESSION:  Progress hip abduction strength and work on gait incorporating newly acquired strength and stability   5:14 PM, 05/01/24 Note: Portions of this document were prepared using Dragon voice recognition software and although reviewed may contain unintentional dictation errors in syntax, grammar, or spelling.  Lonni KATHEE Gainer PT ,DPT Physical Therapist- [place]  [facility]

## 2024-05-03 ENCOUNTER — Ambulatory Visit: Payer: PRIVATE HEALTH INSURANCE

## 2024-05-03 DIAGNOSIS — R2681 Unsteadiness on feet: Secondary | ICD-10-CM

## 2024-05-03 DIAGNOSIS — R531 Weakness: Secondary | ICD-10-CM | POA: Diagnosis not present

## 2024-05-03 DIAGNOSIS — R269 Unspecified abnormalities of gait and mobility: Secondary | ICD-10-CM

## 2024-05-03 NOTE — Therapy (Signed)
 OUTPATIENT PHYSICAL THERAPY TREATMENT  Patient Name: Rita Bailey MRN: [id number] DOB:August 15, 1951, 73 y.o., female Today's Date: 05/03/2024  PCP: Myrla Jon HERO, MD REFERRING PROVIDER: Myrla Jon HERO, MD   END OF SESSION:   PT End of Session - 05/03/24 1446     Visit Number 25    Number of Visits 35    Date for Recertification  05/24/24    Progress Note Due on Visit 30    PT Start Time 1449    PT Stop Time 1530    PT Time Calculation (min) 41 min    Equipment Utilized During Treatment Gait belt    Activity Tolerance Patient tolerated treatment well    Behavior During Therapy [facility] for tasks assessed/performed          Past Medical History:  Diagnosis Date   Allergy    Arthritis    Hypertension    Scoliosis of lumbosacral spine    Past Surgical History:  Procedure Laterality Date   CESAREAN SECTION     COLONOSCOPY WITH PROPOFOL  N/A 05/11/2017   Procedure: COLONOSCOPY WITH PROPOFOL ;  Surgeon: Dessa Reyes ORN, MD;  Location: [facility];  Service: Endoscopy;  Laterality: N/A;   COLONOSCOPY WITH PROPOFOL  N/A 12/16/2021   Procedure: COLONOSCOPY WITH PROPOFOL ;  Surgeon: Dessa Reyes ORN, MD;  Location: [facility];  Service: Endoscopy;  Laterality: N/A;   DILATATION & CURETTAGE/HYSTEROSCOPY WITH MYOSURE N/A 02/27/2016   Procedure: DILATATION & CURETTAGE/HYSTEROSCOPY WITH MYOSURE;  Surgeon: Debby JINNY Dinsmore, MD;  Location: [facility] OR[facility];  Service: Gynecology;  Laterality: N/A;   ESOPHAGOGASTRODUODENOSCOPY (EGD) WITH PROPOFOL  N/A 05/11/2017   Procedure: ESOPHAGOGASTRODUODENOSCOPY (EGD) WITH PROPOFOL ;  Surgeon: Dessa Reyes ORN, MD;  Location: [facility];  Service: Endoscopy;  Laterality: N/A;   sebaceous syst removed  08/24/2011   tonsillectomy     TONSILLECTOMY     Patient Active Problem List   Diagnosis Date Noted   Rheumatoid arthritis (HCC) 05/30/2023   Bilateral hand swelling 12/28/2022   Eczema 04/13/2018   Irritable bowel syndrome with  diarrhea 04/13/2018   Prediabetes 04/13/2018   PMB (postmenopausal bleeding) 10/05/2017   Gastro-esophageal reflux disease with esophagitis 05/13/2017   Strain of muscle of right hip 05/06/2017   Bilateral hearing loss 04/12/2017   Scoliosis of lumbosacral spine 03/30/2017   Abnormality of gait and mobility 03/30/2017   Allergic rhinitis 09/08/2015   Hypertension 09/08/2015   Gonalgia 09/08/2015   RAD (reactive airway disease) 09/08/2015   Obesity 03/12/2015   Current tear of meniscus 01/02/2015   Tear of meniscus of knee 01/02/2015    ONSET DATE: Years ago REFERRING DIAG: R26.9 (ICD-10-CM) - Abnormal gait  THERAPY DIAG:  Generalized weakness  Unsteadiness on feet  Abnormality of gait  Rationale for Evaluation and Treatment: Rehabilitation  SUBJECTIVE:  SUBJECTIVE STATEMENT:  Pt reports she is thinking about maybe transitioning to coming to 1 visit a week.  Pt reports she is just sore from therapy sessions and is unable to recover with it being 2x/week.    PERTINENT HISTORY:  Patient reports she had previously went to [facility] to see Ortho and was referred for physical therapy, but due to logistical errors she did not start PT at that time.  Pt states these gait deviations started years ago. Reports no injury at the time they started. Pt states she has always walked a lot for exercise, but it is hard for her to do now. Pt states she has noticed her gait deviations because she has started getting L thigh pain, but otherwise does not have pain. Pt states these deviations impact her balance. Per MD note on 12/13/2023: Trendelenburg gait due to hip weakness. Trendelenburg gait due to hip weakness identified. Previous recommendation for physical therapy was not followed through due to logistical  issues.  Refer to physical therapy for hip strengthening exercises. PMH: Rheumatoid arthritis, obesity, prediabetes, HTN.   PAIN:  Are you having pain? None PRECAUTIONS: None WEIGHT BEARING RESTRICTIONS: No FALLS: Has patient fallen in last 6 months? Yes. Number of falls 1x tripped backwards over her cat  LIVING ENVIRONMENT: Lives with: lives with their spouse and lives with their son (husband and special needs son) Lives in: House/apartment - 1 level Stairs: Yes: External: 1 steps; on left going up Has following equipment at home: shower chair and Grab bars  PLOF: Independent, Independent with household mobility without device, and Independent with homemaking with ambulation  PATIENT GOALS: be able to walk with a normal gait  OBJECTIVE:  Note: Objective measures were completed at Evaluation unless otherwise noted.                                                                                                                            TREATMENT DATE: 05/03/2024  TA- To improve functional movements patterns for everyday tasks   Prone bent knee hip extensions, with assistance applied by therapist to keep the knee bent, 2x10 each LE Prone straight knee hip extensions, 2x10 each LE  Sidelying clamshells, 2x10 each LE Sidelying reverse clamshells, 2x10 each LE (pt with increased difficulty doing on the R LE)   Side step up with step trainer with UE assist 2 x 10 ea LE  Superset sidestepping with RTB around ankles and hip extension with RTB around ankles 2 x 10 of ea of the exercises alternating  Leg press with R LE eccentric lowering 2 x 10 at 40 #   -2x10 DL leg press @ 29#    NMR: To facilitate reeducation of movement, balance, posture, coordination, and/or proprioception/kinesthetic sense.  Tandem stance on airex beam, 60 sec bouts x2 each LE leading  Tandem stance with LE on airex pad and other LE on 4 step with airex pad on top, 60 seconds bouts x2 each  LE   PATIENT  EDUCATION: Education details: Pt educated throughout session about proper posture and technique with exercises. Improved exercise technique, movement at target joints, use of target muscles after min to mod verbal, visual, tactile cues. Person educated: Patient Education method: Explanation and Handouts Education comprehension: verbalized understanding and needs further education  HOME EXERCISE PROGRAM: Access Code: [email or web address] URL: [email or web address] Date: 02/09/2024 Prepared by: Lonni Gainer    GOALS: Goals reviewed with patient? Yes  SHORT TERM GOALS: Target date: 03/08/2024 Patient will be independent in home exercise program to improve strength/mobility for better functional independence with ADLs.  Baseline: 12/15/2023 initial HEP provided 6/17: pt completing regularly Goal status: MET  LONG TERM GOALS: Target date: 05/24/24  Patient will increase Berg Balance score to > 51/56 to demonstrate improved balance and decreased fall risk during functional activities and ADLs.  Baseline: 55 Goal status: MET  2.  Patient will increase six minute walk test distance to >1064ft for progression to community ambulator and improve gait ability  Baseline: 1080 ft  Goal status: MET  3.  Patient will increase Functional Gait Assessment (FGA) score to >20/30 as to reduce fall risk and improve dynamic gait safety with community ambulation.  Baseline: 23 Goal status: MET  4.  Patient will maintain 30 seconds of single leg stance on each LE without loss of balance indicating increased hip strength and improved ability to ambulate and navigate stairs without trendelenburg pattern. Baseline: L LE: 5 seconds, R LE: 0 seconds  6/17: 17 sec, 3-4 sec 03/29/24: L LE: 25; R LE: 4 Goal status: ONGOING  5.  Patient will increase BLE gross strength to 4+/5 as to improve functional strength for independent gait, increased standing tolerance and increased ADL ability.  Baseline: see  above Goal status: ONGOING  7.  Patient will improve lower extremity functional scale by 10 points or greater in order to indicate improved function as it relates to her lower extremity weakness. Baseline: 50% 03/29/24: 75% Goal status: MET  8.  Patient will report and demonstrate little to no difficulty when ascending 10 stairs or more in order to improve her safety and confidence with community ambulation and accessing her environment. Baseline: moderate difficulty 03/29/24: moderate difficulty Goal status: IN PROGRESS  9.  Patient will increase Mini-BESTest score by >/= 6 points to demonstrate decreased fall risk during functional activities. Baseline: 21/28 Goal status: INITIAL   ASSESSMENT:  CLINICAL IMPRESSION:     Pt responded well to the exercises and continues to benefit from glute strengthening exercises.  Pt still has reduced strength in the glute when ambulating and that continues to be addressed with basic sidelying and prone exercises.  Pt encouraged to perform HEP in order to improve the strength when at home and let us  know of how she tolerates them over the next week since she will be coming only once next week.   Pt will continue to benefit from skilled therapy to address remaining deficits in order to improve overall QoL and return to PLOF.      OBJECTIVE IMPAIRMENTS: Abnormal gait, decreased activity tolerance, decreased balance, decreased endurance, decreased knowledge of condition, decreased mobility, difficulty walking, decreased strength, improper body mechanics, and pain.   ACTIVITY LIMITATIONS: carrying, lifting, bending, standing, squatting, stairs, transfers, dressing, locomotion level, and caring for others  PARTICIPATION LIMITATIONS: cleaning, laundry, shopping, and community activity  PERSONAL FACTORS: Age, Sex, Time since onset of injury/illness/exacerbation, and 3+ comorbidities: Rheumatoid arthritis, obesity, prediabetes, HTN are also affecting  patient's functional  outcome.   REHAB POTENTIAL: Good  CLINICAL DECISION MAKING: Stable/uncomplicated  EVALUATION COMPLEXITY: Low  PLAN:  PT FREQUENCY: 1-2x/week PT DURATION: 12 weeks PLANNED INTERVENTIONS: 97164- PT Re-evaluation, 97750- Physical Performance Testing, 97110-Therapeutic exercises, 97530- Therapeutic activity, [id number]- Neuromuscular re-education, 97535- Self Care, 02859- Manual therapy, [phone]- Gait training, [phone]- Orthotic Initial, [phone]- Orthotic/Prosthetic subsequent, [phone]- Canalith repositioning, [phone]- Electrical stimulation (manual), Patient/Family education, Balance training, Stair training, Taping, Dry Needling, Joint mobilization, Joint manipulation, Spinal mobilization, Vestibular training, DME instructions, Cryotherapy, Moist heat, and Biofeedback  PLAN FOR NEXT SESSION:   Progress hip abduction strength and work on gait incorporating newly acquired strength and stability   Fonda Simpers, PT, DPT Physical Therapist - [facility]  [facility]  05/03/24, 2:50 PM

## 2024-05-08 ENCOUNTER — Ambulatory Visit: Payer: PRIVATE HEALTH INSURANCE | Admitting: Physical Therapy

## 2024-05-10 ENCOUNTER — Ambulatory Visit: Payer: PRIVATE HEALTH INSURANCE

## 2024-05-10 DIAGNOSIS — R531 Weakness: Secondary | ICD-10-CM

## 2024-05-10 DIAGNOSIS — R2681 Unsteadiness on feet: Secondary | ICD-10-CM

## 2024-05-10 DIAGNOSIS — R269 Unspecified abnormalities of gait and mobility: Secondary | ICD-10-CM

## 2024-05-10 NOTE — Therapy (Signed)
 OUTPATIENT PHYSICAL THERAPY TREATMENT  Patient Name: Rita Bailey MRN: [id number] DOB:14-Aug-1951, 73 y.o., female Today's Date: 05/10/2024  PCP: Myrla Jon HERO, MD REFERRING PROVIDER: Myrla Jon HERO, MD   END OF SESSION:   PT End of Session - 05/10/24 1313     Visit Number 26    Number of Visits 35    Date for Recertification  05/24/24    Progress Note Due on Visit 30    PT Start Time 1315    PT Stop Time 1400    PT Time Calculation (min) 45 min    Equipment Utilized During Treatment Gait belt    Activity Tolerance Patient tolerated treatment well    Behavior During Therapy [facility] for tasks assessed/performed          Past Medical History:  Diagnosis Date   Allergy    Arthritis    Hypertension    Scoliosis of lumbosacral spine    Past Surgical History:  Procedure Laterality Date   CESAREAN SECTION     COLONOSCOPY WITH PROPOFOL  N/A 05/11/2017   Procedure: COLONOSCOPY WITH PROPOFOL ;  Surgeon: Dessa Reyes ORN, MD;  Location: [facility];  Service: Endoscopy;  Laterality: N/A;   COLONOSCOPY WITH PROPOFOL  N/A 12/16/2021   Procedure: COLONOSCOPY WITH PROPOFOL ;  Surgeon: Dessa Reyes ORN, MD;  Location: [facility];  Service: Endoscopy;  Laterality: N/A;   DILATATION & CURETTAGE/HYSTEROSCOPY WITH MYOSURE N/A 02/27/2016   Procedure: DILATATION & CURETTAGE/HYSTEROSCOPY WITH MYOSURE;  Surgeon: Debby JINNY Dinsmore, MD;  Location: [facility];  Service: Gynecology;  Laterality: N/A;   ESOPHAGOGASTRODUODENOSCOPY (EGD) WITH PROPOFOL  N/A 05/11/2017   Procedure: ESOPHAGOGASTRODUODENOSCOPY (EGD) WITH PROPOFOL ;  Surgeon: Dessa Reyes ORN, MD;  Location: [facility];  Service: Endoscopy;  Laterality: N/A;   sebaceous syst removed  08/24/2011   tonsillectomy     TONSILLECTOMY     Patient Active Problem List   Diagnosis Date Noted   Rheumatoid arthritis (HCC) 05/30/2023   Bilateral hand swelling 12/28/2022   Eczema 04/13/2018   Irritable bowel syndrome with  diarrhea 04/13/2018   Prediabetes 04/13/2018   PMB (postmenopausal bleeding) 10/05/2017   Gastro-esophageal reflux disease with esophagitis 05/13/2017   Strain of muscle of right hip 05/06/2017   Bilateral hearing loss 04/12/2017   Scoliosis of lumbosacral spine 03/30/2017   Abnormality of gait and mobility 03/30/2017   Allergic rhinitis 09/08/2015   Hypertension 09/08/2015   Gonalgia 09/08/2015   RAD (reactive airway disease) 09/08/2015   Obesity 03/12/2015   Current tear of meniscus 01/02/2015   Tear of meniscus of knee 01/02/2015    ONSET DATE: Years ago REFERRING DIAG: R26.9 (ICD-10-CM) - Abnormal gait  THERAPY DIAG:  Generalized weakness  Unsteadiness on feet  Abnormality of gait  Rationale for Evaluation and Treatment: Rehabilitation  SUBJECTIVE:  SUBJECTIVE STATEMENT:  Pt reports no pain upon arrival.  Pt states she is not wanting to roll around like this author had her do the last visit.  Pt reports she is not as sore since the last visit.    PERTINENT HISTORY:  Patient reports she had previously went to [facility] to see Ortho and was referred for physical therapy, but due to logistical errors she did not start PT at that time.  Pt states these gait deviations started years ago. Reports no injury at the time they started. Pt states she has always walked a lot for exercise, but it is hard for her to do now. Pt states she has noticed her gait deviations because she has started getting L thigh pain, but otherwise does not have pain. Pt states these deviations impact her balance. Per MD note on 12/13/2023: Trendelenburg gait due to hip weakness. Trendelenburg gait due to hip weakness identified. Previous recommendation for physical therapy was not followed through due to logistical issues.   Refer to physical therapy for hip strengthening exercises. PMH: Rheumatoid arthritis, obesity, prediabetes, HTN.   PAIN:  Are you having pain? None  PRECAUTIONS: None WEIGHT BEARING RESTRICTIONS: No FALLS: Has patient fallen in last 6 months? Yes. Number of falls 1x tripped backwards over her cat  LIVING ENVIRONMENT: Lives with: lives with their spouse and lives with their son (husband and special needs son) Lives in: House/apartment - 1 level Stairs: Yes: External: 1 steps; on left going up Has following equipment at home: shower chair and Grab bars  PLOF: Independent, Independent with household mobility without device, and Independent with homemaking with ambulation  PATIENT GOALS: be able to walk with a normal gait  OBJECTIVE: Note: Objective measures were completed at Evaluation unless otherwise noted.  TREATMENT DATE: 05/10/2024  TA- To improve functional movements patterns for everyday tasks   Standing hip extensions at Matrix cable column, 12.5# 2x10 each LE (pt with increased difficulty standing on the R LE and kicking back with the L) Seated LAQ with Matrix cable column, 7.5#, x10 R LE; 12.5# x10 on R LE, 2x10 on L LE  Cable column walkouts, forward/latera/backward, 12.5#, x5 each direction  Standing mini squats for improved glute strengthening, 2x10  Leg press with R LE eccentric lowering 2x10 at 47.5#       PATIENT EDUCATION: Education details: Pt educated throughout session about proper posture and technique with exercises. Improved exercise technique, movement at target joints, use of target muscles after min to mod verbal, visual, tactile cues. Person educated: Patient Education method: Explanation and Handouts Education comprehension: verbalized understanding and needs further education  HOME EXERCISE PROGRAM: Access Code: [email or web address] URL: [email or web address] Date: 02/09/2024 Prepared by: Lonni Gainer    GOALS: Goals reviewed with  patient? Yes  SHORT TERM GOALS: Target date: 03/08/2024 Patient will be independent in home exercise program to improve strength/mobility for better functional independence with ADLs.  Baseline: 12/15/2023 initial HEP provided 6/17: pt completing regularly Goal status: MET  LONG TERM GOALS: Target date: 05/24/24  Patient will increase Berg Balance score to > 51/56 to demonstrate improved balance and decreased fall risk during functional activities and ADLs.  Baseline: 55 Goal status: MET  2.  Patient will increase six minute walk test distance to >1041ft for progression to community ambulator and improve gait ability  Baseline: 1080 ft  Goal status: MET  3.  Patient will increase Functional Gait Assessment (FGA) score to >20/30 as to reduce fall risk and improve dynamic  gait safety with community ambulation.  Baseline: 23 Goal status: MET  4.  Patient will maintain 30 seconds of single leg stance on each LE without loss of balance indicating increased hip strength and improved ability to ambulate and navigate stairs without trendelenburg pattern. Baseline: L LE: 5 seconds, R LE: 0 seconds  6/17: 17 sec, 3-4 sec 03/29/24: L LE: 25; R LE: 4 Goal status: ONGOING  5.  Patient will increase BLE gross strength to 4+/5 as to improve functional strength for independent gait, increased standing tolerance and increased ADL ability.  Baseline: see above Goal status: ONGOING  7.  Patient will improve lower extremity functional scale by 10 points or greater in order to indicate improved function as it relates to her lower extremity weakness. Baseline: 50% 03/29/24: 75% Goal status: MET  8.  Patient will report and demonstrate little to no difficulty when ascending 10 stairs or more in order to improve her safety and confidence with community ambulation and accessing her environment. Baseline: moderate difficulty 03/29/24: moderate difficulty Goal status: IN PROGRESS  9.  Patient will increase  Mini-BESTest score by >/= 6 points to demonstrate decreased fall risk during functional activities. Baseline: 21/28 Goal status: INITIAL   ASSESSMENT:  CLINICAL IMPRESSION:     Pt elected to not perform bed exercises due to not wanting to roll around.  Pt given cable column exercises instead and pt was able to demonstrate improved strength while in weightbearing.  Pt did have some difficulty with stance time on the R LE at times, and will continue to be improve upon throughout therapy sessions moving forward.   Pt will continue to benefit from skilled therapy to address remaining deficits in order to improve overall QoL and return to PLOF.     Pt responded well to the exercises and continues to benefit from glute strengthening exercises.  Pt still has reduced strength in the glute when ambulating and that continues to be addressed with basic sidelying and prone exercises.  Pt encouraged to perform HEP in order to improve the strength when at home and let us  know of how she tolerates them over the next week since she will be coming only once next week.   Pt will continue to benefit from skilled therapy to address remaining deficits in order to improve overall QoL and return to PLOF.      OBJECTIVE IMPAIRMENTS: Abnormal gait, decreased activity tolerance, decreased balance, decreased endurance, decreased knowledge of condition, decreased mobility, difficulty walking, decreased strength, improper body mechanics, and pain.   ACTIVITY LIMITATIONS: carrying, lifting, bending, standing, squatting, stairs, transfers, dressing, locomotion level, and caring for others  PARTICIPATION LIMITATIONS: cleaning, laundry, shopping, and community activity  PERSONAL FACTORS: Age, Sex, Time since onset of injury/illness/exacerbation, and 3+ comorbidities: Rheumatoid arthritis, obesity, prediabetes, HTN are also affecting patient's functional outcome.   REHAB POTENTIAL: Good  CLINICAL DECISION MAKING:  Stable/uncomplicated  EVALUATION COMPLEXITY: Low  PLAN:  PT FREQUENCY: 1-2x/week PT DURATION: 12 weeks PLANNED INTERVENTIONS: 97164- PT Re-evaluation, 97750- Physical Performance Testing, 97110-Therapeutic exercises, 97530- Therapeutic activity, [id number]- Neuromuscular re-education, 97535- Self Care, [id number]- Manual therapy, [id number]- Gait training, [phone]- Orthotic Initial, [phone]- Orthotic/Prosthetic subsequent, [phone]- Canalith repositioning, [phone]- Electrical stimulation (manual), Patient/Family education, Balance training, Stair training, Taping, Dry Needling, Joint mobilization, Joint manipulation, Spinal mobilization, Vestibular training, DME instructions, Cryotherapy, Moist heat, and Biofeedback  PLAN FOR NEXT SESSION:   Progress hip abduction strength and work on gait incorporating newly acquired strength and stability   Fonda Simpers, PT, DPT  Physical Therapist - [facility]  05/10/24, 2:46 PM

## 2024-05-15 ENCOUNTER — Ambulatory Visit: Payer: PRIVATE HEALTH INSURANCE | Admitting: Physical Therapy

## 2024-05-17 ENCOUNTER — Ambulatory Visit: Payer: PRIVATE HEALTH INSURANCE | Attending: Family Medicine

## 2024-05-17 DIAGNOSIS — R269 Unspecified abnormalities of gait and mobility: Secondary | ICD-10-CM | POA: Insufficient documentation

## 2024-05-17 DIAGNOSIS — R531 Weakness: Secondary | ICD-10-CM | POA: Diagnosis present

## 2024-05-17 DIAGNOSIS — R2681 Unsteadiness on feet: Secondary | ICD-10-CM | POA: Diagnosis present

## 2024-05-17 NOTE — Therapy (Signed)
 OUTPATIENT PHYSICAL THERAPY TREATMENT  Patient Name: SHATOYA ROETS MRN: [id number] DOB:10-20-1950, 73 y.o., female Today's Date: 05/17/2024  PCP: Myrla Jon HERO, MD REFERRING PROVIDER: Myrla Jon HERO, MD   END OF SESSION:    PT End of Session - 05/17/24 1320     Visit Number 27    Number of Visits 35    Date for Recertification  05/24/24    Progress Note Due on Visit 30    PT Start Time 1318    PT Stop Time 1400    PT Time Calculation (min) 42 min    Equipment Utilized During Treatment Gait belt    Activity Tolerance Patient tolerated treatment well    Behavior During Therapy [facility] for tasks assessed/performed         Past Medical History:  Diagnosis Date   Allergy    Arthritis    Hypertension    Scoliosis of lumbosacral spine    Past Surgical History:  Procedure Laterality Date   CESAREAN SECTION     COLONOSCOPY WITH PROPOFOL  N/A 05/11/2017   Procedure: COLONOSCOPY WITH PROPOFOL ;  Surgeon: Dessa Reyes ORN, MD;  Location: [facility];  Service: Endoscopy;  Laterality: N/A;   COLONOSCOPY WITH PROPOFOL  N/A 12/16/2021   Procedure: COLONOSCOPY WITH PROPOFOL ;  Surgeon: Dessa Reyes ORN, MD;  Location: [facility];  Service: Endoscopy;  Laterality: N/A;   DILATATION & CURETTAGE/HYSTEROSCOPY WITH MYOSURE N/A 02/27/2016   Procedure: DILATATION & CURETTAGE/HYSTEROSCOPY WITH MYOSURE;  Surgeon: Debby JINNY Dinsmore, MD;  Location: [facility];  Service: Gynecology;  Laterality: N/A;   ESOPHAGOGASTRODUODENOSCOPY (EGD) WITH PROPOFOL  N/A 05/11/2017   Procedure: ESOPHAGOGASTRODUODENOSCOPY (EGD) WITH PROPOFOL ;  Surgeon: Dessa Reyes ORN, MD;  Location: [facility];  Service: Endoscopy;  Laterality: N/A;   sebaceous syst removed  08/24/2011   tonsillectomy     TONSILLECTOMY     Patient Active Problem List   Diagnosis Date Noted   Rheumatoid arthritis (HCC) 05/30/2023   Bilateral hand swelling 12/28/2022   Eczema 04/13/2018   Irritable bowel syndrome with  diarrhea 04/13/2018   Prediabetes 04/13/2018   PMB (postmenopausal bleeding) 10/05/2017   Gastro-esophageal reflux disease with esophagitis 05/13/2017   Strain of muscle of right hip 05/06/2017   Bilateral hearing loss 04/12/2017   Scoliosis of lumbosacral spine 03/30/2017   Abnormality of gait and mobility 03/30/2017   Allergic rhinitis 09/08/2015   Hypertension 09/08/2015   Gonalgia 09/08/2015   RAD (reactive airway disease) 09/08/2015   Obesity 03/12/2015   Current tear of meniscus 01/02/2015   Tear of meniscus of knee 01/02/2015    ONSET DATE: Years ago REFERRING DIAG: R26.9 (ICD-10-CM) - Abnormal gait  THERAPY DIAG:  Generalized weakness  Unsteadiness on feet  Abnormality of gait  Rationale for Evaluation and Treatment: Rehabilitation  SUBJECTIVE:  SUBJECTIVE STATEMENT:  Pt reports having a new kitten in the house.  Pt reports that she was sore after the last session to the point where she would roll to the R side and it would wake her up, so she has attempted to not lie down on that side.   PERTINENT HISTORY:  Patient reports she had previously went to [facility] to see Ortho and was referred for physical therapy, but due to logistical errors she did not start PT at that time.  Pt states these gait deviations started years ago. Reports no injury at the time they started. Pt states she has always walked a lot for exercise, but it is hard for her to do now. Pt states she has noticed her gait deviations because she has started getting L thigh pain, but otherwise does not have pain. Pt states these deviations impact her balance. Per MD note on 12/13/2023: Trendelenburg gait due to hip weakness. Trendelenburg gait due to hip weakness identified. Previous recommendation for physical therapy was  not followed through due to logistical issues.  Refer to physical therapy for hip strengthening exercises. PMH: Rheumatoid arthritis, obesity, prediabetes, HTN.   PAIN:  Are you having pain? None  PRECAUTIONS: None  WEIGHT BEARING RESTRICTIONS: No  FALLS: Has patient fallen in last 6 months? Yes. Number of falls 1x tripped backwards over her cat  LIVING ENVIRONMENT: Lives with: lives with their spouse and lives with their son (husband and special needs son) Lives in: House/apartment - 1 level Stairs: Yes: External: 1 steps; on left going up Has following equipment at home: shower chair and Grab bars  PLOF: Independent, Independent with household mobility without device, and Independent with homemaking with ambulation  PATIENT GOALS: be able to walk with a normal gait  OBJECTIVE: Note: Objective measures were completed at Evaluation unless otherwise noted.  TREATMENT DATE: 05/17/2024   TherEx: To improve strength, endurance, mobility, and function of specific targeted muscle groups or improve joint range of motion or improve muscle flexibility   Standing hip extensions at Matrix cable column, 12.5#, 2x15 each LE Standing hip abduction at Matrix cable column, 7.5#, 2x15 each LE Standing hip adduction at Matrix cable column, 7.5#, 2x15 each LE Seated LAQ with Matrix cable column, 7.5# 2x10  UE utilized on the back of chair and set up time needed to move the straps throughout the process.     PATIENT EDUCATION: Education details: Pt educated throughout session about proper posture and technique with exercises. Improved exercise technique, movement at target joints, use of target muscles after min to mod verbal, visual, tactile cues. Person educated: Patient Education method: Explanation and Handouts Education comprehension: verbalized understanding and needs further education  HOME EXERCISE PROGRAM: Access Code: [email or web address] URL: [email or web address] Date:  02/09/2024 Prepared by: Lonni Gainer    GOALS: Goals reviewed with patient? Yes  SHORT TERM GOALS: Target date: 03/08/2024 Patient will be independent in home exercise program to improve strength/mobility for better functional independence with ADLs.  Baseline: 12/15/2023 initial HEP provided 6/17: pt completing regularly Goal status: MET  LONG TERM GOALS: Target date: 05/24/24  Patient will increase Berg Balance score to > 51/56 to demonstrate improved balance and decreased fall risk during functional activities and ADLs.  Baseline: 55 Goal status: MET  2.  Patient will increase six minute walk test distance to >1041ft for progression to community ambulator and improve gait ability  Baseline: 1080 ft  Goal status: MET  3.  Patient will increase Functional Gait Assessment (  FGA) score to >20/30 as to reduce fall risk and improve dynamic gait safety with community ambulation.  Baseline: 23 Goal status: MET  4.  Patient will maintain 30 seconds of single leg stance on each LE without loss of balance indicating increased hip strength and improved ability to ambulate and navigate stairs without trendelenburg pattern. Baseline: L LE: 5 seconds, R LE: 0 seconds  6/17: 17 sec, 3-4 sec 03/29/24: L LE: 25; R LE: 4 Goal status: ONGOING  5.  Patient will increase BLE gross strength to 4+/5 as to improve functional strength for independent gait, increased standing tolerance and increased ADL ability.  Baseline: see above Goal status: ONGOING  7.  Patient will improve lower extremity functional scale by 10 points or greater in order to indicate improved function as it relates to her lower extremity weakness. Baseline: 50% 03/29/24: 75% Goal status: MET  8.  Patient will report and demonstrate little to no difficulty when ascending 10 stairs or more in order to improve her safety and confidence with community ambulation and accessing her environment. Baseline: moderate difficulty 03/29/24:  moderate difficulty Goal status: IN PROGRESS  9.  Patient will increase Mini-BESTest score by >/= 6 points to demonstrate decreased fall risk during functional activities. Baseline: 21/28 Goal status: INITIAL   ASSESSMENT:  CLINICAL IMPRESSION:     Pt reports that she really enjoyed doing the weight training at the last session, so it was continued at this time.  Pt responded well and was challenged by having to place weight throughout the L LE when doing the R LE exercises, and vice versa.  Pt ultimately strengthened the hips and was able to perform increased set of reps, although not necessarily increase in the resistance.   Pt will continue to benefit from skilled therapy to address remaining deficits in order to improve overall QoL and return to PLOF.       OBJECTIVE IMPAIRMENTS: Abnormal gait, decreased activity tolerance, decreased balance, decreased endurance, decreased knowledge of condition, decreased mobility, difficulty walking, decreased strength, improper body mechanics, and pain.   ACTIVITY LIMITATIONS: carrying, lifting, bending, standing, squatting, stairs, transfers, dressing, locomotion level, and caring for others  PARTICIPATION LIMITATIONS: cleaning, laundry, shopping, and community activity  PERSONAL FACTORS: Age, Sex, Time since onset of injury/illness/exacerbation, and 3+ comorbidities: Rheumatoid arthritis, obesity, prediabetes, HTN are also affecting patient's functional outcome.   REHAB POTENTIAL: Good  CLINICAL DECISION MAKING: Stable/uncomplicated  EVALUATION COMPLEXITY: Low  PLAN:  PT FREQUENCY: 1-2x/week PT DURATION: 12 weeks PLANNED INTERVENTIONS: 97164- PT Re-evaluation, 97750- Physical Performance Testing, 97110-Therapeutic exercises, 97530- Therapeutic activity, [id number]- Neuromuscular re-education, 97535- Self Care, 02859- Manual therapy, [phone]- Gait training, [id number]- Orthotic Initial, [id number]- Orthotic/Prosthetic subsequent, [phone]- Canalith  repositioning, [phone]- Electrical stimulation (manual), Patient/Family education, Balance training, Stair training, Taping, Dry Needling, Joint mobilization, Joint manipulation, Spinal mobilization, Vestibular training, DME instructions, Cryotherapy, Moist heat, and Biofeedback  PLAN FOR NEXT SESSION:    Progress hip abduction strength and work on gait incorporating newly acquired strength and stability     Fonda Simpers, PT, DPT Physical Therapist - [facility]  05/17/24, 5:27 PM

## 2024-05-22 ENCOUNTER — Ambulatory Visit: Payer: PRIVATE HEALTH INSURANCE | Admitting: Physical Therapy

## 2024-05-22 ENCOUNTER — Ambulatory Visit: Payer: PRIVATE HEALTH INSURANCE

## 2024-05-22 DIAGNOSIS — R2681 Unsteadiness on feet: Secondary | ICD-10-CM

## 2024-05-22 DIAGNOSIS — R531 Weakness: Secondary | ICD-10-CM

## 2024-05-22 DIAGNOSIS — R269 Unspecified abnormalities of gait and mobility: Secondary | ICD-10-CM

## 2024-05-22 NOTE — Therapy (Signed)
 OUTPATIENT PHYSICAL THERAPY TREATMENT  Patient Name: Rita Bailey MRN: [id number] DOB:[birth year], 73 y.o., female Today's Date: 05/22/2024  PCP: Myrla Jon HERO, MD REFERRING PROVIDER: Myrla Jon HERO, MD   END OF SESSION:    PT End of Session - 05/22/24 1534     Visit Number 28    Number of Visits 35    Date for Recertification  05/24/24    Progress Note Due on Visit 30    PT Start Time 1535    PT Stop Time 1615    PT Time Calculation (min) 40 min    Equipment Utilized During Treatment Gait belt    Activity Tolerance Patient tolerated treatment well    Behavior During Therapy [facility] for tasks assessed/performed          Past Medical History:  Diagnosis Date   Allergy    Arthritis    Hypertension    Scoliosis of lumbosacral spine    Past Surgical History:  Procedure Laterality Date   CESAREAN SECTION     COLONOSCOPY WITH PROPOFOL  N/A 05/11/2017   Procedure: COLONOSCOPY WITH PROPOFOL ;  Surgeon: Dessa Reyes ORN, MD;  Location: [facility] ENDOSCOPY;  Service: Endoscopy;  Laterality: N/A;   COLONOSCOPY WITH PROPOFOL  N/A 12/16/2021   Procedure: COLONOSCOPY WITH PROPOFOL ;  Surgeon: Dessa Reyes ORN, MD;  Location: [facility] ENDOSCOPY;  Service: Endoscopy;  Laterality: N/A;   DILATATION & CURETTAGE/HYSTEROSCOPY WITH MYOSURE N/A 02/27/2016   Procedure: DILATATION & CURETTAGE/HYSTEROSCOPY WITH MYOSURE;  Surgeon: Debby JINNY Dinsmore, MD;  Location: [facility] ORS;  Service: Gynecology;  Laterality: N/A;   ESOPHAGOGASTRODUODENOSCOPY (EGD) WITH PROPOFOL  N/A 05/11/2017   Procedure: ESOPHAGOGASTRODUODENOSCOPY (EGD) WITH PROPOFOL ;  Surgeon: Dessa Reyes ORN, MD;  Location: [facility] ENDOSCOPY;  Service: Endoscopy;  Laterality: N/A;   sebaceous syst removed  08/24/2011   tonsillectomy     TONSILLECTOMY     Patient Active Problem List   Diagnosis Date Noted   Rheumatoid arthritis (HCC) 05/30/2023   Bilateral hand swelling 12/28/2022   Eczema 04/13/2018   Irritable bowel syndrome with  diarrhea 04/13/2018   Prediabetes 04/13/2018   PMB (postmenopausal bleeding) 10/05/2017   Gastro-esophageal reflux disease with esophagitis 05/13/2017   Strain of muscle of right hip 05/06/2017   Bilateral hearing loss 04/12/2017   Scoliosis of lumbosacral spine 03/30/2017   Abnormality of gait and mobility 03/30/2017   Allergic rhinitis 09/08/2015   Hypertension 09/08/2015   Gonalgia 09/08/2015   RAD (reactive airway disease) 09/08/2015   Obesity 03/12/2015   Current tear of meniscus 01/02/2015   Tear of meniscus of knee 01/02/2015    ONSET DATE: Years ago REFERRING DIAG: R26.9 (ICD-10-CM) - Abnormal gait  THERAPY DIAG:  Generalized weakness  Unsteadiness on feet  Abnormality of gait  Rationale for Evaluation and Treatment: Rehabilitation  SUBJECTIVE:  SUBJECTIVE STATEMENT:  Pt reports she has been busy running errands and dealing with stuff around the house.   PERTINENT HISTORY:  Patient reports she had previously went to [facility] to see Ortho and was referred for physical therapy, but due to logistical errors she did not start PT at that time.  Pt states these gait deviations started years ago. Reports no injury at the time they started. Pt states she has always walked a lot for exercise, but it is hard for her to do now. Pt states she has noticed her gait deviations because she has started getting L thigh pain, but otherwise does not have pain. Pt states these deviations impact her balance. Per MD note on 12/13/2023: Trendelenburg gait due to hip weakness. Trendelenburg gait due to hip weakness identified. Previous recommendation for physical therapy was not followed through due to logistical issues.  Refer to physical therapy for hip strengthening exercises. PMH: Rheumatoid arthritis,  obesity, prediabetes, HTN.   PAIN:  Are you having pain? None  PRECAUTIONS: None  WEIGHT BEARING RESTRICTIONS: No  FALLS: Has patient fallen in last 6 months? Yes. Number of falls 1x tripped backwards over her cat  LIVING ENVIRONMENT: Lives with: lives with their spouse and lives with their son (husband and special needs son) Lives in: House/apartment - 1 level Stairs: Yes: External: 1 steps; on left going up Has following equipment at home: shower chair and Grab bars  PLOF: Independent, Independent with household mobility without device, and Independent with homemaking with ambulation  PATIENT GOALS: be able to walk with a normal gait  OBJECTIVE: Note: Objective measures were completed at Evaluation unless otherwise noted.  TREATMENT DATE: 05/22/2024  TherEx: To improve strength, endurance, mobility, and function of specific targeted muscle groups or improve joint range of motion or improve muscle flexibility  Cable column walkouts in // bars, forward/lateral/backward, 12.5#, x5 each direction Standing hip hikes at // bars, 2x10  Standing lunges with slight Ue support on // bars, 2x10  Standing hip extensions at Matrix cable column, 12.5#, 2x15 each LE Standing hip abduction at Matrix cable column, 7.5#, 2x15 each LE Standing hip adduction at Matrix cable column, 7.5#, 2x15 each LE Seated LAQ with Matrix cable column, 7.5# 2x10  UE utilized on the back of chair and set up time needed to move the straps throughout the process.     PATIENT EDUCATION: Education details: Pt educated throughout session about proper posture and technique with exercises. Improved exercise technique, movement at target joints, use of target muscles after min to mod verbal, visual, tactile cues. Person educated: Patient Education method: Explanation and Handouts Education comprehension: verbalized understanding and needs further education  HOME EXERCISE PROGRAM: Access Code: [email or web address] URL:  [email or web address] Date: 02/09/2024 Prepared by: Lonni Gainer    GOALS: Goals reviewed with patient? Yes  SHORT TERM GOALS: Target date: 03/08/2024 Patient will be independent in home exercise program to improve strength/mobility for better functional independence with ADLs.  Baseline: 12/15/2023 initial HEP provided 6/17: pt completing regularly Goal status: MET  LONG TERM GOALS: Target date: 05/24/24  Patient will increase Berg Balance score to > 51/56 to demonstrate improved balance and decreased fall risk during functional activities and ADLs.  Baseline: 55 Goal status: MET  2.  Patient will increase six minute walk test distance to >10105ft for progression to community ambulator and improve gait ability  Baseline: 1080 ft  Goal status: MET  3.  Patient will increase Functional Gait Assessment (FGA) score to >20/30 as  to reduce fall risk and improve dynamic gait safety with community ambulation.  Baseline: 23 Goal status: MET  4.  Patient will maintain 30 seconds of single leg stance on each LE without loss of balance indicating increased hip strength and improved ability to ambulate and navigate stairs without trendelenburg pattern. Baseline: L LE: 5 seconds, R LE: 0 seconds  6/17: 17 sec, 3-4 sec 03/29/24: L LE: 25; R LE: 4 Goal status: ONGOING  5.  Patient will increase BLE gross strength to 4+/5 as to improve functional strength for independent gait, increased standing tolerance and increased ADL ability.  Baseline: see above Goal status: ONGOING  7.  Patient will improve lower extremity functional scale by 10 points or greater in order to indicate improved function as it relates to her lower extremity weakness. Baseline: 50% 03/29/24: 75% Goal status: MET  8.  Patient will report and demonstrate little to no difficulty when ascending 10 stairs or more in order to improve her safety and confidence with community ambulation and accessing her  environment. Baseline: moderate difficulty 03/29/24: moderate difficulty Goal status: IN PROGRESS  9.  Patient will increase Mini-BESTest score by >/= 6 points to demonstrate decreased fall risk during functional activities. Baseline: 21/28 Goal status: INITIAL   ASSESSMENT:  CLINICAL IMPRESSION:     ***     OBJECTIVE IMPAIRMENTS: Abnormal gait, decreased activity tolerance, decreased balance, decreased endurance, decreased knowledge of condition, decreased mobility, difficulty walking, decreased strength, improper body mechanics, and pain.   ACTIVITY LIMITATIONS: carrying, lifting, bending, standing, squatting, stairs, transfers, dressing, locomotion level, and caring for others  PARTICIPATION LIMITATIONS: cleaning, laundry, shopping, and community activity  PERSONAL FACTORS: Age, Sex, Time since onset of injury/illness/exacerbation, and 3+ comorbidities: Rheumatoid arthritis, obesity, prediabetes, HTN are also affecting patient's functional outcome.   REHAB POTENTIAL: Good  CLINICAL DECISION MAKING: Stable/uncomplicated  EVALUATION COMPLEXITY: Low  PLAN:  PT FREQUENCY: 1-2x/week PT DURATION: 12 weeks PLANNED INTERVENTIONS: 97164- PT Re-evaluation, 97750- Physical Performance Testing, 97110-Therapeutic exercises, 97530- Therapeutic activity, [id number]- Neuromuscular re-education, 97535- Self Care, 02859- Manual therapy, [phone]- Gait training, [phone]- Orthotic Initial, [phone]- Orthotic/Prosthetic subsequent, [phone]- Canalith repositioning, [phone]- Electrical stimulation (manual), Patient/Family education, Balance training, Stair training, Taping, Dry Needling, Joint mobilization, Joint manipulation, Spinal mobilization, Vestibular training, DME instructions, Cryotherapy, Moist heat, and Biofeedback  PLAN FOR NEXT SESSION:   *** Progress hip abduction strength and work on gait incorporating newly acquired strength and stability     Fonda Simpers, PT, DPT Physical Therapist - [facility]  [facility]  05/22/24, 3:35 PM

## 2024-05-24 ENCOUNTER — Ambulatory Visit: Payer: PRIVATE HEALTH INSURANCE

## 2024-05-28 ENCOUNTER — Ambulatory Visit

## 2024-05-28 NOTE — Therapy (Incomplete)
 OUTPATIENT PHYSICAL THERAPY TREATMENT  Patient Name: Rita Bailey MRN: [id number] DOB:09-12-1950, 73 y.o., female Today's Date: 05/28/2024  PCP: Myrla Jon HERO, MD REFERRING PROVIDER: Myrla Jon HERO, MD   END OF SESSION:      Past Medical History:  Diagnosis Date   Allergy    Arthritis    Hypertension    Scoliosis of lumbosacral spine    Past Surgical History:  Procedure Laterality Date   CESAREAN SECTION     COLONOSCOPY WITH PROPOFOL  N/A 05/11/2017   Procedure: COLONOSCOPY WITH PROPOFOL ;  Surgeon: Dessa Reyes ORN, MD;  Location: [facility];  Service: Endoscopy;  Laterality: N/A;   COLONOSCOPY WITH PROPOFOL  N/A 12/16/2021   Procedure: COLONOSCOPY WITH PROPOFOL ;  Surgeon: Dessa Reyes ORN, MD;  Location: [facility];  Service: Endoscopy;  Laterality: N/A;   DILATATION & CURETTAGE/HYSTEROSCOPY WITH MYOSURE N/A 02/27/2016   Procedure: DILATATION & CURETTAGE/HYSTEROSCOPY WITH MYOSURE;  Surgeon: Debby JINNY Dinsmore, MD;  Location: [facility] ORS;  Service: Gynecology;  Laterality: N/A;   ESOPHAGOGASTRODUODENOSCOPY (EGD) WITH PROPOFOL  N/A 05/11/2017   Procedure: ESOPHAGOGASTRODUODENOSCOPY (EGD) WITH PROPOFOL ;  Surgeon: Dessa Reyes ORN, MD;  Location: [facility];  Service: Endoscopy;  Laterality: N/A;   sebaceous syst removed  08/24/2011   tonsillectomy     TONSILLECTOMY     Patient Active Problem List   Diagnosis Date Noted   Rheumatoid arthritis (HCC) 05/30/2023   Bilateral hand swelling 12/28/2022   Eczema 04/13/2018   Irritable bowel syndrome with diarrhea 04/13/2018   Prediabetes 04/13/2018   PMB (postmenopausal bleeding) 10/05/2017   Gastro-esophageal reflux disease with esophagitis 05/13/2017   Strain of muscle of right hip 05/06/2017   Bilateral hearing loss 04/12/2017   Scoliosis of lumbosacral spine 03/30/2017   Abnormality of gait and mobility 03/30/2017   Allergic rhinitis 09/08/2015   Hypertension 09/08/2015   Gonalgia 09/08/2015    RAD (reactive airway disease) 09/08/2015   Obesity 03/12/2015   Current tear of meniscus 01/02/2015   Tear of meniscus of knee 01/02/2015    ONSET DATE: Years ago REFERRING DIAG: R26.9 (ICD-10-CM) - Abnormal gait  THERAPY DIAG:  No diagnosis found.  Rationale for Evaluation and Treatment: Rehabilitation  SUBJECTIVE:                                                                                                                                                                                           SUBJECTIVE STATEMENT:  ***   PERTINENT HISTORY:  Patient reports she had previously went to [facility] to see Ortho and was referred for physical therapy, but due to logistical errors she did not start PT at that time.  Pt states these gait deviations started years ago. Reports no injury at the time they started. Pt states she has always walked a lot for exercise, but it is hard for her to do now. Pt states she has noticed her gait deviations because she has started getting L thigh pain, but otherwise does not have pain. Pt states these deviations impact her balance. Per MD note on 12/13/2023: Trendelenburg gait due to hip weakness. Trendelenburg gait due to hip weakness identified. Previous recommendation for physical therapy was not followed through due to logistical issues.  Refer to physical therapy for hip strengthening exercises. PMH: Rheumatoid arthritis, obesity, prediabetes, HTN.   PAIN:  Are you having pain? None  PRECAUTIONS: None  WEIGHT BEARING RESTRICTIONS: No  FALLS: Has patient fallen in last 6 months? Yes. Number of falls 1x tripped backwards over her cat  LIVING ENVIRONMENT: Lives with: lives with their spouse and lives with their son (husband and special needs son) Lives in: House/apartment - 1 level Stairs: Yes: External: 1 steps; on left going up Has following equipment at home: shower chair and Grab bars  PLOF: Independent, Independent with household  mobility without device, and Independent with homemaking with ambulation  PATIENT GOALS: be able to walk with a normal gait  OBJECTIVE: Note: Objective measures were completed at Evaluation unless otherwise noted.  TREATMENT DATE: 05/28/2024  ***  TherEx: To improve strength, endurance, mobility, and function of specific targeted muscle groups or improve joint range of motion or improve muscle flexibility  Cable column walkouts in // bars, forward/lateral/backward, 12.5#, x5 each direction Standing hip hikes at // bars, 2x10  Standing lunges with light UE support on // bars, 2x10  Standing hip hikes from 4 stair stepper 2x10 each side with increased difficulty performing on the L side  UE utilized on the back of chair and set up time needed to move the straps throughout the process.     PATIENT EDUCATION: Education details: Pt educated throughout session about proper posture and technique with exercises. Improved exercise technique, movement at target joints, use of target muscles after min to mod verbal, visual, tactile cues. Person educated: Patient Education method: Explanation and Handouts Education comprehension: verbalized understanding and needs further education  HOME EXERCISE PROGRAM: Access Code: [email or web address] URL: [email or web address] Date: 02/09/2024 Prepared by: Lonni Gainer    GOALS: Goals reviewed with patient? Yes  SHORT TERM GOALS: Target date: 03/08/2024 Patient will be independent in home exercise program to improve strength/mobility for better functional independence with ADLs.  Baseline: 12/15/2023 initial HEP provided 6/17: pt completing regularly Goal status: MET  LONG TERM GOALS: Target date: 05/24/24  Patient will increase Berg Balance score to > 51/56 to demonstrate improved balance and decreased fall risk during functional activities and ADLs.  Baseline: 55 Goal status: MET  2.  Patient will increase six minute walk test  distance to >1028ft for progression to community ambulator and improve gait ability  Baseline: 1080 ft  Goal status: MET  3.  Patient will increase Functional Gait Assessment (FGA) score to >20/30 as to reduce fall risk and improve dynamic gait safety with community ambulation.  Baseline: 23 Goal status: MET  4.  Patient will maintain 30 seconds of single leg stance on each LE without loss of balance indicating increased hip strength and improved ability to ambulate and navigate stairs without trendelenburg pattern. Baseline: L LE: 5 seconds, R LE: 0 seconds  6/17: 17 sec, 3-4 sec 03/29/24: L LE: 25; R LE: 4 Goal status:  ONGOING  5.  Patient will increase BLE gross strength to 4+/5 as to improve functional strength for independent gait, increased standing tolerance and increased ADL ability.  Baseline: see above Goal status: ONGOING  7.  Patient will improve lower extremity functional scale by 10 points or greater in order to indicate improved function as it relates to her lower extremity weakness. Baseline: 50% 03/29/24: 75% Goal status: MET  8.  Patient will report and demonstrate little to no difficulty when ascending 10 stairs or more in order to improve her safety and confidence with community ambulation and accessing her environment. Baseline: moderate difficulty 03/29/24: moderate difficulty Goal status: IN PROGRESS  9.  Patient will increase Mini-BESTest score by >/= 6 points to demonstrate decreased fall risk during functional activities. Baseline: 21/28 Goal status: INITIAL   ASSESSMENT:  CLINICAL IMPRESSION:     ***     OBJECTIVE IMPAIRMENTS: Abnormal gait, decreased activity tolerance, decreased balance, decreased endurance, decreased knowledge of condition, decreased mobility, difficulty walking, decreased strength, improper body mechanics, and pain.   ACTIVITY LIMITATIONS: carrying, lifting, bending, standing, squatting, stairs, transfers, dressing, locomotion  level, and caring for others  PARTICIPATION LIMITATIONS: cleaning, laundry, shopping, and community activity  PERSONAL FACTORS: Age, Sex, Time since onset of injury/illness/exacerbation, and 3+ comorbidities: Rheumatoid arthritis, obesity, prediabetes, HTN are also affecting patient's functional outcome.   REHAB POTENTIAL: Good  CLINICAL DECISION MAKING: Stable/uncomplicated  EVALUATION COMPLEXITY: Low  PLAN:  PT FREQUENCY: 1-2x/week PT DURATION: 12 weeks PLANNED INTERVENTIONS: 97164- PT Re-evaluation, 97750- Physical Performance Testing, 97110-Therapeutic exercises, 97530- Therapeutic activity, [id number]- Neuromuscular re-education, 97535- Self Care, 02859- Manual therapy, [phone]- Gait training, [phone]- Orthotic Initial, [phone]- Orthotic/Prosthetic subsequent, [phone]- Canalith repositioning, [phone]- Electrical stimulation (manual), Patient/Family education, Balance training, Stair training, Taping, Dry Needling, Joint mobilization, Joint manipulation, Spinal mobilization, Vestibular training, DME instructions, Cryotherapy, Moist heat, and Biofeedback  PLAN FOR NEXT SESSION:  *** Progress hip abduction strength and work on gait incorporating newly acquired strength and stability     Fonda Simpers, PT, DPT Physical Therapist - [facility]  [facility]  05/28/24, 8:05 AM

## 2024-05-30 ENCOUNTER — Ambulatory Visit: Admitting: Physical Therapy

## 2024-05-30 NOTE — Therapy (Deleted)
 OUTPATIENT PHYSICAL THERAPY TREATMENT  Patient Name: Rita Bailey MRN: [id number] DOB:11-11-1950, 73 y.o., female Today's Date: 05/30/2024  PCP: Myrla Jon HERO, MD REFERRING PROVIDER: Myrla Jon HERO, MD   END OF SESSION:      Past Medical History:  Diagnosis Date   Allergy    Arthritis    Hypertension    Scoliosis of lumbosacral spine    Past Surgical History:  Procedure Laterality Date   CESAREAN SECTION     COLONOSCOPY WITH PROPOFOL  N/A 05/11/2017   Procedure: COLONOSCOPY WITH PROPOFOL ;  Surgeon: Dessa Reyes ORN, MD;  Location: [facility];  Service: Endoscopy;  Laterality: N/A;   COLONOSCOPY WITH PROPOFOL  N/A 12/16/2021   Procedure: COLONOSCOPY WITH PROPOFOL ;  Surgeon: Dessa Reyes ORN, MD;  Location: [facility];  Service: Endoscopy;  Laterality: N/A;   DILATATION & CURETTAGE/HYSTEROSCOPY WITH MYOSURE N/A 02/27/2016   Procedure: DILATATION & CURETTAGE/HYSTEROSCOPY WITH MYOSURE;  Surgeon: Debby JINNY Dinsmore, MD;  Location: [facility];  Service: Gynecology;  Laterality: N/A;   ESOPHAGOGASTRODUODENOSCOPY (EGD) WITH PROPOFOL  N/A 05/11/2017   Procedure: ESOPHAGOGASTRODUODENOSCOPY (EGD) WITH PROPOFOL ;  Surgeon: Dessa Reyes ORN, MD;  Location: [facility];  Service: Endoscopy;  Laterality: N/A;   sebaceous syst removed  08/24/2011   tonsillectomy     TONSILLECTOMY     Patient Active Problem List   Diagnosis Date Noted   Rheumatoid arthritis (HCC) 05/30/2023   Bilateral hand swelling 12/28/2022   Eczema 04/13/2018   Irritable bowel syndrome with diarrhea 04/13/2018   Prediabetes 04/13/2018   PMB (postmenopausal bleeding) 10/05/2017   Gastro-esophageal reflux disease with esophagitis 05/13/2017   Strain of muscle of right hip 05/06/2017   Bilateral hearing loss 04/12/2017   Scoliosis of lumbosacral spine 03/30/2017   Abnormality of gait and mobility 03/30/2017   Allergic rhinitis 09/08/2015   Hypertension 09/08/2015   Gonalgia 09/08/2015    RAD (reactive airway disease) 09/08/2015   Obesity 03/12/2015   Current tear of meniscus 01/02/2015   Tear of meniscus of knee 01/02/2015    ONSET DATE: Years ago REFERRING DIAG: R26.9 (ICD-10-CM) - Abnormal gait  THERAPY DIAG:  No diagnosis found.  Rationale for Evaluation and Treatment: Rehabilitation  SUBJECTIVE:                                                                                                                                                                                           SUBJECTIVE STATEMENT:  ***   PERTINENT HISTORY:  Patient reports she had previously went to [facility] to see Ortho and was referred for physical therapy, but due to logistical errors she did not start PT at that time.  Pt states these gait deviations started years ago. Reports no injury at the time they started. Pt states she has always walked a lot for exercise, but it is hard for her to do now. Pt states she has noticed her gait deviations because she has started getting L thigh pain, but otherwise does not have pain. Pt states these deviations impact her balance. Per MD note on 12/13/2023: Trendelenburg gait due to hip weakness. Trendelenburg gait due to hip weakness identified. Previous recommendation for physical therapy was not followed through due to logistical issues.  Refer to physical therapy for hip strengthening exercises. PMH: Rheumatoid arthritis, obesity, prediabetes, HTN.   PAIN:  Are you having pain? None  PRECAUTIONS: None  WEIGHT BEARING RESTRICTIONS: No  FALLS: Has patient fallen in last 6 months? Yes. Number of falls 1x tripped backwards over her cat  LIVING ENVIRONMENT: Lives with: lives with their spouse and lives with their son (husband and special needs son) Lives in: House/apartment - 1 level Stairs: Yes: External: 1 steps; on left going up Has following equipment at home: shower chair and Grab bars  PLOF: Independent, Independent with household  mobility without device, and Independent with homemaking with ambulation  PATIENT GOALS: be able to walk with a normal gait  OBJECTIVE: Note: Objective measures were completed at Evaluation unless otherwise noted.  TREATMENT DATE: 05/30/2024  ***  TherEx: To improve strength, endurance, mobility, and function of specific targeted muscle groups or improve joint range of motion or improve muscle flexibility  Cable column walkouts in // bars, forward/lateral/backward, 12.5#, x5 each direction Standing hip hikes at // bars, 2x10  Standing lunges with light UE support on // bars, 2x10  Standing hip hikes from 4 stair stepper 2x10 each side with increased difficulty performing on the L side  UE utilized on the back of chair and set up time needed to move the straps throughout the process.  Leg press with R LE eccentric lowering 2 x 10 at 40 #   -2x10 DL leg press @ 29#   PATIENT EDUCATION: Education details: Pt educated throughout session about proper posture and technique with exercises. Improved exercise technique, movement at target joints, use of target muscles after min to mod verbal, visual, tactile cues. Person educated: Patient Education method: Explanation and Handouts Education comprehension: verbalized understanding and needs further education  HOME EXERCISE PROGRAM: Access Code: [email or web address] URL: [email or web address] Date: 02/09/2024 Prepared by: Lonni Gainer    GOALS: Goals reviewed with patient? Yes  SHORT TERM GOALS: Target date: 03/08/2024 Patient will be independent in home exercise program to improve strength/mobility for better functional independence with ADLs.  Baseline: 12/15/2023 initial HEP provided 6/17: pt completing regularly Goal status: MET  LONG TERM GOALS: Target date: 05/24/24  Patient will increase Berg Balance score to > 51/56 to demonstrate improved balance and decreased fall risk during functional activities and ADLs.   Baseline: 55 Goal status: MET  2.  Patient will increase six minute walk test distance to >1056ft for progression to community ambulator and improve gait ability  Baseline: 1080 ft  Goal status: MET  3.  Patient will increase Functional Gait Assessment (FGA) score to >20/30 as to reduce fall risk and improve dynamic gait safety with community ambulation.  Baseline: 23 Goal status: MET  4.  Patient will maintain 30 seconds of single leg stance on each LE without loss of balance indicating increased hip strength and improved ability to ambulate and navigate stairs without trendelenburg pattern. Baseline: L LE: 5  seconds, R LE: 0 seconds  6/17: 17 sec, 3-4 sec 03/29/24: L LE: 25; R LE: 4 Goal status: ONGOING  5.  Patient will increase BLE gross strength to 4+/5 as to improve functional strength for independent gait, increased standing tolerance and increased ADL ability.  Baseline: see above Goal status: ONGOING  7.  Patient will improve lower extremity functional scale by 10 points or greater in order to indicate improved function as it relates to her lower extremity weakness. Baseline: 50% 03/29/24: 75% Goal status: MET  8.  Patient will report and demonstrate little to no difficulty when ascending 10 stairs or more in order to improve her safety and confidence with community ambulation and accessing her environment. Baseline: moderate difficulty 03/29/24: moderate difficulty Goal status: IN PROGRESS  9.  Patient will increase Mini-BESTest score by >/= 6 points to demonstrate decreased fall risk during functional activities. Baseline: 21/28 Goal status: INITIAL   ASSESSMENT:  CLINICAL IMPRESSION:     ***     OBJECTIVE IMPAIRMENTS: Abnormal gait, decreased activity tolerance, decreased balance, decreased endurance, decreased knowledge of condition, decreased mobility, difficulty walking, decreased strength, improper body mechanics, and pain.   ACTIVITY LIMITATIONS:  carrying, lifting, bending, standing, squatting, stairs, transfers, dressing, locomotion level, and caring for others  PARTICIPATION LIMITATIONS: cleaning, laundry, shopping, and community activity  PERSONAL FACTORS: Age, Sex, Time since onset of injury/illness/exacerbation, and 3+ comorbidities: Rheumatoid arthritis, obesity, prediabetes, HTN are also affecting patient's functional outcome.   REHAB POTENTIAL: Good  CLINICAL DECISION MAKING: Stable/uncomplicated  EVALUATION COMPLEXITY: Low  PLAN:  PT FREQUENCY: 1-2x/week PT DURATION: 12 weeks PLANNED INTERVENTIONS: 97164- PT Re-evaluation, 97750- Physical Performance Testing, 97110-Therapeutic exercises, 97530- Therapeutic activity, [id number]- Neuromuscular re-education, 97535- Self Care, [id number]- Manual therapy, [id number]- Gait training, [phone]- Orthotic Initial, [phone]- Orthotic/Prosthetic subsequent, [phone]- Canalith repositioning, [phone]- Electrical stimulation (manual), Patient/Family education, Balance training, Stair training, Taping, Dry Needling, Joint mobilization, Joint manipulation, Spinal mobilization, Vestibular training, DME instructions, Cryotherapy, Moist heat, and Biofeedback  PLAN FOR NEXT SESSION:  *** Progress hip abduction strength and work on gait incorporating newly acquired strength and stability     Note: Portions of this document were prepared using Conservation officer, historic buildings and although reviewed may contain unintentional dictation errors in syntax, grammar, or spelling.  Lonni KATHEE Gainer PT ,DPT Physical Therapist- [facility]   05/30/24, 8:27 AM

## 2024-06-05 ENCOUNTER — Ambulatory Visit: Payer: PRIVATE HEALTH INSURANCE

## 2024-06-07 ENCOUNTER — Ambulatory Visit: Payer: PRIVATE HEALTH INSURANCE

## 2024-06-07 ENCOUNTER — Ambulatory Visit: Admitting: Physical Therapy

## 2024-06-07 DIAGNOSIS — R531 Weakness: Secondary | ICD-10-CM

## 2024-06-07 DIAGNOSIS — R2681 Unsteadiness on feet: Secondary | ICD-10-CM

## 2024-06-07 DIAGNOSIS — R269 Unspecified abnormalities of gait and mobility: Secondary | ICD-10-CM

## 2024-06-07 NOTE — Therapy (Signed)
 OUTPATIENT PHYSICAL THERAPY TREATMENT/ RE-CERTIFICATION NOTE    Patient Name: Rita Bailey MRN: [id number] DOB:[birth year], 73 y.o., female Today's Date: 06/07/2024  PCP: Myrla Jon HERO, MD REFERRING PROVIDER: Myrla Jon HERO, MD   END OF SESSION:    PT End of Session - 06/07/24 1315     Visit Number 29    Number of Visits 35    Date for Recertification  05/24/24    Progress Note Due on Visit 30    PT Start Time 1317    PT Stop Time 1357    PT Time Calculation (min) 40 min    Equipment Utilized During Treatment Gait belt    Activity Tolerance Patient tolerated treatment well    Behavior During Therapy [facility] for tasks assessed/performed           Past Medical History:  Diagnosis Date   Allergy    Arthritis    Hypertension    Scoliosis of lumbosacral spine    Past Surgical History:  Procedure Laterality Date   CESAREAN SECTION     COLONOSCOPY WITH PROPOFOL  N/A 05/11/2017   Procedure: COLONOSCOPY WITH PROPOFOL ;  Surgeon: Dessa Reyes ORN, MD;  Location: [facility];  Service: Endoscopy;  Laterality: N/A;   COLONOSCOPY WITH PROPOFOL  N/A 12/16/2021   Procedure: COLONOSCOPY WITH PROPOFOL ;  Surgeon: Dessa Reyes ORN, MD;  Location: [facility];  Service: Endoscopy;  Laterality: N/A;   DILATATION & CURETTAGE/HYSTEROSCOPY WITH MYOSURE N/A 02/27/2016   Procedure: DILATATION & CURETTAGE/HYSTEROSCOPY WITH MYOSURE;  Surgeon: Debby JINNY Dinsmore, MD;  Location: [facility];  Service: Gynecology;  Laterality: N/A;   ESOPHAGOGASTRODUODENOSCOPY (EGD) WITH PROPOFOL  N/A 05/11/2017   Procedure: ESOPHAGOGASTRODUODENOSCOPY (EGD) WITH PROPOFOL ;  Surgeon: Dessa Reyes ORN, MD;  Location: [facility];  Service: Endoscopy;  Laterality: N/A;   sebaceous syst removed  08/24/2011   tonsillectomy     TONSILLECTOMY     Patient Active Problem List   Diagnosis Date Noted   Rheumatoid arthritis (HCC) 05/30/2023   Bilateral hand swelling 12/28/2022   Eczema 04/13/2018    Irritable bowel syndrome with diarrhea 04/13/2018   Prediabetes 04/13/2018   PMB (postmenopausal bleeding) 10/05/2017   Gastro-esophageal reflux disease with esophagitis 05/13/2017   Strain of muscle of right hip 05/06/2017   Bilateral hearing loss 04/12/2017   Scoliosis of lumbosacral spine 03/30/2017   Abnormality of gait and mobility 03/30/2017   Allergic rhinitis 09/08/2015   Hypertension 09/08/2015   Gonalgia 09/08/2015   RAD (reactive airway disease) 09/08/2015   Obesity 03/12/2015   Current tear of meniscus 01/02/2015   Tear of meniscus of knee 01/02/2015    ONSET DATE: Years ago REFERRING DIAG: R26.9 (ICD-10-CM) - Abnormal gait  THERAPY DIAG:  Generalized weakness  Unsteadiness on feet  Abnormality of gait  Rationale for Evaluation and Treatment: Rehabilitation  SUBJECTIVE:  SUBJECTIVE STATEMENT:  Pt had good time at the beach. Was not able to do as much exercise as she wanted.    PERTINENT HISTORY:  Patient reports she had previously went to [facility] to see Ortho and was referred for physical therapy, but due to logistical errors she did not start PT at that time.  Pt states these gait deviations started years ago. Reports no injury at the time they started. Pt states she has always walked a lot for exercise, but it is hard for her to do now. Pt states she has noticed her gait deviations because she has started getting L thigh pain, but otherwise does not have pain. Pt states these deviations impact her balance. Per MD note on 12/13/2023: Trendelenburg gait due to hip weakness. Trendelenburg gait due to hip weakness identified. Previous recommendation for physical therapy was not followed through due to logistical issues.  Refer to physical therapy for hip strengthening exercises.  PMH: Rheumatoid arthritis, obesity, prediabetes, HTN.   PAIN:  Are you having pain? None  PRECAUTIONS: None  WEIGHT BEARING RESTRICTIONS: No  FALLS: Has patient fallen in last 6 months? Yes. Number of falls 1x tripped backwards over her cat  LIVING ENVIRONMENT: Lives with: lives with their spouse and lives with their son (husband and special needs son) Lives in: House/apartment - 1 level Stairs: Yes: External: 1 steps; on left going up Has following equipment at home: shower chair and Grab bars  PLOF: Independent, Independent with household mobility without device, and Independent with homemaking with ambulation  PATIENT GOALS: be able to walk with a normal gait  OBJECTIVE: Note: Objective measures were completed at Evaluation unless otherwise noted.  TREATMENT DATE: 06/07/2024  Physical Performance Test or Measurement: a  physical performance test(s) or measurement (eg,  musculoskeletal, functional capacity), with written report,  each 15 mins   Mini best: 21   [facility] PT Assessment - 06/07/24 0001       Standardized Balance Assessment   Standardized Balance Assessment Mini-BESTest      Mini-BESTest   Sit To Stand Normal: Comes to stand without use of hands and stabilizes independently.    Rise to Toes Normal: Stable for 3 s with maximum height.    Stand on one leg (left) Normal: 20 s.    Stand on one leg (right) Moderate: < 20 s    Stand on one leg - lowest score 1    Compensatory Stepping Correction - Forward Normal: Recovers independently with a single, large step (second realignement is allowed).    Compensatory Stepping Correction - Backward Normal: Recovers independently with a single, large step    Compensatory Stepping Correction - Left Lateral Severe: Falls, or cannot step    Compensatory Stepping Correction - Right Lateral Normal: Recovers independently with 1 step (crossover or lateral OK)    Stepping Corredtion Lateral - lowest score 0    Stance - Feet  together, eyes open, firm surface  Normal: 30s    Stance - Feet together, eyes closed, foam surface  Normal: 30s    Incline - Eyes Closed Normal: Stands independently 30s and aligns with gravity    Change in Gait Speed Moderate: Unable to change walking speed or signs of imbalance    Walk with head turns - Horizontal Moderate: performs head turns with reduction in gait speed.    Walk with pivot turns Normal: Turns with feet close FAST (< 3 steps) with good balance.    Step over obstacles Moderate: Steps  over box but touches box OR displays cautious behavior by slowing gait.    Timed UP & GO with Dual Task Moderate: Dual Task affects either counting OR walking (>10%) when compared to the TUG without Dual Task.   10.14 sec   Mini-BEST total score 21           TherEx: To improve strength, endurance, mobility, and function of specific targeted muscle groups or improve joint range of motion or improve muscle flexibility Leg press with R LE eccentric lowering 2 x 15 at 40 #   -2x10 DL leg press @ 14#   Self care: Instructed patient in her current level of progress and in ways to improve her Trendelenburg gait.  Instructed she could utilize a cane in her left upper extremity to offset her weak right lower extremity.  PATIENT EDUCATION: Education details: Pt educated throughout session about proper posture and technique with exercises. Improved exercise technique, movement at target joints, use of target muscles after min to mod verbal, visual, tactile cues. Person educated: Patient Education method: Explanation and Handouts Education comprehension: verbalized understanding and needs further education  HOME EXERCISE PROGRAM: Access Code: [email or web address] URL: [email or web address] Date: 06/07/24 Prepared by: Lonni Gainer    GOALS: Goals reviewed with patient? Yes  SHORT TERM GOALS: Target date: 03/08/2024 Patient will be independent in home exercise program to improve  strength/mobility for better functional independence with ADLs.  Baseline: 12/15/2023 initial HEP provided 6/17: pt completing regularly  Goal status: MET  LONG TERM GOALS: Target date: 08/02/2024    Patient will increase Berg Balance score to > 51/56 to demonstrate improved balance and decreased fall risk during functional activities and ADLs.  Baseline: 55 Goal status: MET  2.  Patient will increase six minute walk test distance to >1014ft for progression to community ambulator and improve gait ability  Baseline: 1080 ft  Goal status: MET  3.  Patient will increase Functional Gait Assessment (FGA) score to >20/30 as to reduce fall risk and improve dynamic gait safety with community ambulation.  Baseline: 23 Goal status: MET  4.  Patient will maintain 30 seconds of single leg stance on each LE without loss of balance indicating increased hip strength and improved ability to ambulate and navigate stairs without trendelenburg pattern. Baseline: L LE: 5 seconds, R LE: 0 seconds  6/17: 17 sec, 3-4 sec 03/29/24: L LE: 25; R LE: 4 10/23: 20 sec on L 10 sec on R  Goal status: ONGOING/ Progressing  5.  Patient will increase BLE gross strength to 4+/5 as to improve functional strength for independent gait, increased standing tolerance and increased ADL ability.  Baseline: see above Goal status: ONGOING  7.  Patient will improve lower extremity functional scale by 10 points or greater in order to indicate improved function as it relates to her lower extremity weakness. Baseline: 50% 03/29/24: 75% Goal status: MET  8.  Patient will report and demonstrate little to no difficulty when ascending 10 stairs or more in order to improve her safety and confidence with community ambulation and accessing her environment. Baseline: moderate difficulty 03/29/24: moderate difficulty 10/23:  difficulty and requires handrail ( min difficulty per LEFS) Goal status: IN PROGRESS  9.  Patient will increase  Mini-BESTest score by >/= 6 points to demonstrate decreased fall risk during functional activities. Baseline: 21/28 10/23:21 Goal status: ONGOING   ASSESSMENT:  CLINICAL IMPRESSION:     Pt presents for re-certification note. Pt shows progress toward goals this date despite some  hiatus during beach trip. Pt single leg balance showing particular improvement. Pt eager to continue to improve her balance and gait. Pt still ambulates with significant trendelenberg but is improving particularly at increased speeds. Patient's condition has the potential to improve in response to therapy. Maximum improvement is yet to be obtained. The anticipated improvement is attainable and reasonable in a generally predictable time.  Pt will continue to benefit from skilled physical therapy intervention to address impairments, improve QOL, and attain therapy goals.       OBJECTIVE IMPAIRMENTS: Abnormal gait, decreased activity tolerance, decreased balance, decreased endurance, decreased knowledge of condition, decreased mobility, difficulty walking, decreased strength, improper body mechanics, and pain.   ACTIVITY LIMITATIONS: carrying, lifting, bending, standing, squatting, stairs, transfers, dressing, locomotion level, and caring for others  PARTICIPATION LIMITATIONS: cleaning, laundry, shopping, and community activity  PERSONAL FACTORS: Age, Sex, Time since onset of injury/illness/exacerbation, and 3+ comorbidities: Rheumatoid arthritis, obesity, prediabetes, HTN are also affecting patient's functional outcome.   REHAB POTENTIAL: Good  CLINICAL DECISION MAKING: Stable/uncomplicated  EVALUATION COMPLEXITY: Low  PLAN:  PT FREQUENCY: 1-2x/week PT DURATION: 12 weeks PLANNED INTERVENTIONS: 97164- PT Re-evaluation, 97750- Physical Performance Testing, 97110-Therapeutic exercises, 97530- Therapeutic activity, [id number]- Neuromuscular re-education, 97535- Self Care, 02859- Manual therapy, [id number]- Gait training,  [phone]- Orthotic Initial, [phone]- Orthotic/Prosthetic subsequent, [phone]- Canalith repositioning, [phone]- Electrical stimulation (manual), Patient/Family education, Balance training, Stair training, Taping, Dry Needling, Joint mobilization, Joint manipulation, Spinal mobilization, Vestibular training, DME instructions, Cryotherapy, Moist heat, and Biofeedback  PLAN FOR NEXT SESSION:   Progress hip abduction strength and work on gait incorporating newly acquired strength and stability     Note: Portions of this document were prepared using Conservation officer, historic buildings and although reviewed may contain unintentional dictation errors in syntax, grammar, or spelling.  Lonni KATHEE Gainer PT ,DPT Physical Therapist- [facility]   06/07/24, 1:16 PM

## 2024-06-11 ENCOUNTER — Ambulatory Visit

## 2024-06-11 DIAGNOSIS — R531 Weakness: Secondary | ICD-10-CM | POA: Diagnosis not present

## 2024-06-11 DIAGNOSIS — R2681 Unsteadiness on feet: Secondary | ICD-10-CM

## 2024-06-11 DIAGNOSIS — R269 Unspecified abnormalities of gait and mobility: Secondary | ICD-10-CM

## 2024-06-11 NOTE — Therapy (Signed)
 OUTPATIENT PHYSICAL THERAPY TREATMENT/ Physical Therapy Progress Note   Dates of reporting period  04/03/2024   to   06/11/2024     Patient Name: Rita Bailey MRN: [id number] DOB:31-Oct-1950, 73 y.o., female Today's Date: 06/11/2024  PCP: Myrla Jon HERO, MD REFERRING PROVIDER: Myrla Jon HERO, MD   END OF SESSION:    PT End of Session - 06/11/24 1106     Visit Number 30    Number of Visits 53    Date for Recertification  08/02/24    Progress Note Due on Visit 30    PT Start Time 1102    PT Stop Time 1144    PT Time Calculation (min) 42 min    Equipment Utilized During Treatment Gait belt    Activity Tolerance Patient tolerated treatment well    Behavior During Therapy [facility] for tasks assessed/performed            Past Medical History:  Diagnosis Date   Allergy    Arthritis    Hypertension    Scoliosis of lumbosacral spine    Past Surgical History:  Procedure Laterality Date   CESAREAN SECTION     COLONOSCOPY WITH PROPOFOL  N/A 05/11/2017   Procedure: COLONOSCOPY WITH PROPOFOL ;  Surgeon: Dessa Reyes ORN, MD;  Location: [facility] ENDOSCOPY;  Service: Endoscopy;  Laterality: N/A;   COLONOSCOPY WITH PROPOFOL  N/A 12/16/2021   Procedure: COLONOSCOPY WITH PROPOFOL ;  Surgeon: Dessa Reyes ORN, MD;  Location: [facility] ENDOSCOPY;  Service: Endoscopy;  Laterality: N/A;   DILATATION & CURETTAGE/HYSTEROSCOPY WITH MYOSURE N/A 02/27/2016   Procedure: DILATATION & CURETTAGE/HYSTEROSCOPY WITH MYOSURE;  Surgeon: Debby JINNY Dinsmore, MD;  Location: [facility] ORS;  Service: Gynecology;  Laterality: N/A;   ESOPHAGOGASTRODUODENOSCOPY (EGD) WITH PROPOFOL  N/A 05/11/2017   Procedure: ESOPHAGOGASTRODUODENOSCOPY (EGD) WITH PROPOFOL ;  Surgeon: Dessa Reyes ORN, MD;  Location: [facility] ENDOSCOPY;  Service: Endoscopy;  Laterality: N/A;   sebaceous syst removed  08/24/2011   tonsillectomy     TONSILLECTOMY     Patient Active Problem List   Diagnosis Date Noted   Rheumatoid arthritis (HCC)  05/30/2023   Bilateral hand swelling 12/28/2022   Eczema 04/13/2018   Irritable bowel syndrome with diarrhea 04/13/2018   Prediabetes 04/13/2018   PMB (postmenopausal bleeding) 10/05/2017   Gastro-esophageal reflux disease with esophagitis 05/13/2017   Strain of muscle of right hip 05/06/2017   Bilateral hearing loss 04/12/2017   Scoliosis of lumbosacral spine 03/30/2017   Abnormality of gait and mobility 03/30/2017   Allergic rhinitis 09/08/2015   Hypertension 09/08/2015   Gonalgia 09/08/2015   RAD (reactive airway disease) 09/08/2015   Obesity 03/12/2015   Current tear of meniscus 01/02/2015   Tear of meniscus of knee 01/02/2015    ONSET DATE: Years ago REFERRING DIAG: R26.9 (ICD-10-CM) - Abnormal gait  THERAPY DIAG:  Generalized weakness  Unsteadiness on feet  Abnormality of gait  Rationale for Evaluation and Treatment: Rehabilitation  SUBJECTIVE:  SUBJECTIVE STATEMENT: I am doing pretty good. 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     PERTINENT HISTORY:  Patient reports she had previously went to [facility] to see Ortho and was referred for physical therapy, but due to logistical errors she did not start PT at that time.  Pt states these gait deviations started years ago. Reports no injury at the time they started. Pt states she has always walked a lot for exercise, but it is hard for her to do now. Pt states she has noticed her gait deviations because she has started getting L thigh pain, but otherwise does not have pain. Pt states these deviations impact her balance. Per MD note on 12/13/2023: Trendelenburg gait due to hip weakness. Trendelenburg gait due to hip weakness identified. Previous recommendation for physical therapy was not followed through due to logistical issues.   Refer to physical therapy for hip strengthening exercises. PMH: Rheumatoid arthritis, obesity, prediabetes, HTN.   PAIN:  Are you having pain? None  PRECAUTIONS: None  WEIGHT BEARING RESTRICTIONS: No  FALLS: Has patient fallen in last 6 months? Yes. Number of falls 1x tripped backwards over her cat  LIVING ENVIRONMENT: Lives with: lives with their spouse and lives with their son (husband and special needs son) Lives in: House/apartment - 1 level Stairs: Yes: External: 1 steps; on left going up Has following equipment at home: shower chair and Grab bars  PLOF: Independent, Independent with household mobility without device, and Independent with homemaking with ambulation  PATIENT GOALS: be able to walk with a normal gait  OBJECTIVE: Note: Objective measures were completed at Evaluation unless otherwise noted.  TREATMENT DATE: 06/11/2024       TherEx: To improve strength, endurance, mobility, and function of specific targeted muscle groups or improve joint range of motion or improve muscle flexibility  Hip hike (5 step) 2 x 10 reps each Side  Self care:   Reviewed and reassessed HEP today. Added to HEP and discussed need for patient to perform HEP as directed for max benefit. Discussed potential plan to decrease frequency down to 1x/week and focused on review of HEP to prepare for patient to perform more on her own.  Recommended continued use of cane in her left upper extremity to offset her weak right lower extremity.  SLS- Multiple attempts each LE  Tandem standing- Hold up to 30 sec x multiple times each LE Added hip circles- 10 reps CW/CCW x 2 sets  Side stepping with RTB- down and back x 3    PATIENT EDUCATION: Education details: Pt educated throughout session about proper posture and technique with exercises. Improved exercise technique, movement at target joints, use of target muscles after min to mod verbal, visual, tactile cues. Person educated:  Patient Education method: Explanation and Handouts Education comprehension: verbalized understanding and needs further education  HOME EXERCISE PROGRAM: Access Code: [email or web address] URL: [email or web address] Date: 06/11/2024 Prepared by: Reyes London  Exercises - Supine Bridge with Resistance Band  - 1 x daily - 7 x weekly - 2 sets - 15 reps - Marching Bridge  - 1 x daily - 7 x weekly - 2 sets - 10 reps - Supine Hip Adduction Isometric with Ball  - 1 x daily - 7 x weekly - 2 sets - 10 reps - 3-5 sec  hold - Side Stepping with Resistance at Thighs and Counter Support  - 1 x daily - 7 x weekly - 2-3 sets - 10 reps - Standing  Hip Extension with Resistance at Ankles and Counter Support  - 1 x daily - 7 x weekly - 3 sets - 10 reps - Single Leg Stance with Support  - 1 x daily - 7 x weekly - 3 sets - 30 second hold - Standing Hip Circles at [employer]  - 3 x weekly - 3 sets - 10 reps      Access Code: [email or web address] URL: [email or web address] Date: 06/07/24 Prepared by: Lonni Gainer    GOALS: Goals reviewed with patient? Yes  SHORT TERM GOALS: Target date: 03/08/2024 Patient will be independent in home exercise program to improve strength/mobility for better functional independence with ADLs.  Baseline: 12/15/2023 initial HEP provided 6/17: pt completing regularly  Goal status: MET  LONG TERM GOALS: Target date: 08/02/2024    Patient will increase Berg Balance score to > 51/56 to demonstrate improved balance and decreased fall risk during functional activities and ADLs.  Baseline: 55 Goal status: MET  2.  Patient will increase six minute walk test distance to >10100ft for progression to community ambulator and improve gait ability  Baseline: 1080 ft  Goal status: MET  3.  Patient will increase Functional Gait Assessment (FGA) score to >20/30 as to reduce fall risk and improve dynamic gait safety with community ambulation.  Baseline: 23 Goal status:  MET  4.  Patient will maintain 30 seconds of single leg stance on each LE without loss of balance indicating increased hip strength and improved ability to ambulate and navigate stairs without trendelenburg pattern. Baseline: L LE: 5 seconds, R LE: 0 seconds  6/17: 17 sec, 3-4 sec 03/29/24: L LE: 25; R LE: 4 10/23: 20 sec on L 10 sec on R  Goal status: ONGOING/ Progressing  5.  Patient will increase BLE gross strength to 4+/5 as to improve functional strength for independent gait, increased standing tolerance and increased ADL ability.  Baseline: see above Goal status: ONGOING  7.  Patient will improve lower extremity functional scale by 10 points or greater in order to indicate improved function as it relates to her lower extremity weakness. Baseline: 50% 03/29/24: 75% Goal status: MET  8.  Patient will report and demonstrate little to no difficulty when ascending 10 stairs or more in order to improve her safety and confidence with community ambulation and accessing her environment. Baseline: moderate difficulty 03/29/24: moderate difficulty 10/23:  difficulty and requires handrail ( min difficulty per LEFS) Goal status: IN PROGRESS  9.  Patient will increase Mini-BESTest score by >/= 6 points to demonstrate decreased fall risk during functional activities. Baseline: 21/28 10/23:21 Goal status: ONGOING   ASSESSMENT:  CLINICAL IMPRESSION:     Pt presents for Progress visit however did not reassess goals again as these were all just performed last visit for recert. Treatment focused on reviewing HEP to emphasize importance of performing activities at home as she progresses toward future discharge. Reviewed all previous activities and added to HEP today without report of any Right hip pain.Pt will continue to benefit from skilled physical therapy intervention to address impairments, improve QOL, and attain therapy goals.       OBJECTIVE IMPAIRMENTS: Abnormal gait, decreased activity  tolerance, decreased balance, decreased endurance, decreased knowledge of condition, decreased mobility, difficulty walking, decreased strength, improper body mechanics, and pain.   ACTIVITY LIMITATIONS: carrying, lifting, bending, standing, squatting, stairs, transfers, dressing, locomotion level, and caring for others  PARTICIPATION LIMITATIONS: cleaning, laundry, shopping, and community activity  PERSONAL FACTORS: Age, Sex, Time since onset of  injury/illness/exacerbation, and 3+ comorbidities: Rheumatoid arthritis, obesity, prediabetes, HTN are also affecting patient's functional outcome.   REHAB POTENTIAL: Good  CLINICAL DECISION MAKING: Stable/uncomplicated  EVALUATION COMPLEXITY: Low  PLAN:  PT FREQUENCY: 1-2x/week PT DURATION: 12 weeks PLANNED INTERVENTIONS: 97164- PT Re-evaluation, 97750- Physical Performance Testing, 97110-Therapeutic exercises, 97530- Therapeutic activity, 97112- Neuromuscular re-education, 97535- Self Care, 02859- Manual therapy, [phone]- Gait training, [phone]- Orthotic Initial, [phone]- Orthotic/Prosthetic subsequent, [phone]- Canalith repositioning, [phone]- Electrical stimulation (manual), Patient/Family education, Balance training, Stair training, Taping, Dry Needling, Joint mobilization, Joint manipulation, Spinal mobilization, Vestibular training, DME instructions, Cryotherapy, Moist heat, and Biofeedback  PLAN FOR NEXT SESSION:   Progress hip abduction strength and work on gait incorporating newly acquired strength and stability       Reyes LOISE London PT  Physical Therapist- [facility]  [facility]   06/11/24, 1:15 PM

## 2024-06-12 ENCOUNTER — Ambulatory Visit: Payer: PRIVATE HEALTH INSURANCE

## 2024-06-13 ENCOUNTER — Ambulatory Visit

## 2024-06-13 DIAGNOSIS — R269 Unspecified abnormalities of gait and mobility: Secondary | ICD-10-CM

## 2024-06-13 DIAGNOSIS — R531 Weakness: Secondary | ICD-10-CM | POA: Diagnosis not present

## 2024-06-13 DIAGNOSIS — R2681 Unsteadiness on feet: Secondary | ICD-10-CM

## 2024-06-13 NOTE — Therapy (Signed)
 OUTPATIENT PHYSICAL THERAPY TREATMENT  Patient Name: Rita Bailey MRN: [id number] DOB:26-Nov-1950, 73 y.o., female Today's Date: 06/13/2024  PCP: Myrla Jon HERO, MD REFERRING PROVIDER: Myrla Jon HERO, MD   END OF SESSION:    PT End of Session - 06/13/24 1101     Visit Number 31    Number of Visits 53    Date for Recertification  08/02/24    Progress Note Due on Visit 30    PT Start Time 1101    PT Stop Time 1145    PT Time Calculation (min) 44 min    Equipment Utilized During Treatment Gait belt    Activity Tolerance Patient tolerated treatment well    Behavior During Therapy [facility] for tasks assessed/performed          Past Medical History:  Diagnosis Date   Allergy    Arthritis    Hypertension    Scoliosis of lumbosacral spine    Past Surgical History:  Procedure Laterality Date   CESAREAN SECTION     COLONOSCOPY WITH PROPOFOL  N/A 05/11/2017   Procedure: COLONOSCOPY WITH PROPOFOL ;  Surgeon: Dessa Reyes ORN, MD;  Location: [facility] ENDOSCOPY;  Service: Endoscopy;  Laterality: N/A;   COLONOSCOPY WITH PROPOFOL  N/A 12/16/2021   Procedure: COLONOSCOPY WITH PROPOFOL ;  Surgeon: Dessa Reyes ORN, MD;  Location: [facility] END[facility];  Service: Endoscopy;  Laterality: N/A;   DILATATION & CURETTAGE/HYSTEROSCOPY WITH MYOSURE N/A 02/27/2016   Procedure: DILATATION & CURETTAGE/HYSTEROSCOPY WITH MYOSURE;  Surgeon: Debby JINNY Dinsmore, MD;  Location: [facility] ORS;  Service: Gynecology;  Laterality: N/A;   ESOPHAGOGASTRODUODENOSCOPY (EGD) WITH PROPOFOL  N/A 05/11/2017   Procedure: ESOPHAGOGASTRODUODENOSCOPY (EGD) WITH PROPOFOL ;  Surgeon: Dessa Reyes ORN, MD;  Location: [facility] ENDOSCOPY;  Service: Endoscopy;  Laterality: N/A;   sebaceous syst removed  08/24/2011   tonsillectomy     TONSILLECTOMY     Patient Active Problem List   Diagnosis Date Noted   Rheumatoid arthritis (HCC) 05/30/2023   Bilateral hand swelling 12/28/2022   Eczema 04/13/2018   Irritable bowel syndrome with  diarrhea 04/13/2018   Prediabetes 04/13/2018   PMB (postmenopausal bleeding) 10/05/2017   Gastro-esophageal reflux disease with esophagitis 05/13/2017   Strain of muscle of right hip 05/06/2017   Bilateral hearing loss 04/12/2017   Scoliosis of lumbosacral spine 03/30/2017   Abnormality of gait and mobility 03/30/2017   Allergic rhinitis 09/08/2015   Hypertension 09/08/2015   Gonalgia 09/08/2015   RAD (reactive airway disease) 09/08/2015   Obesity 03/12/2015   Current tear of meniscus 01/02/2015   Tear of meniscus of knee 01/02/2015    ONSET DATE: Years ago REFERRING DIAG: R26.9 (ICD-10-CM) - Abnormal gait  THERAPY DIAG:  Generalized weakness  Unsteadiness on feet  Abnormality of gait  Rationale for Evaluation and Treatment: Rehabilitation  SUBJECTIVE:  SUBJECTIVE STATEMENT:  Pt wanting to discuss potential for discharge with therapist and potential for change in current POC.   PERTINENT HISTORY:  Patient reports she had previously went to [facility] to see Ortho and was referred for physical therapy, but due to logistical errors she did not start PT at that time.  Pt states these gait deviations started years ago. Reports no injury at the time they started. Pt states she has always walked a lot for exercise, but it is hard for her to do now. Pt states she has noticed her gait deviations because she has started getting L thigh pain, but otherwise does not have pain. Pt states these deviations impact her balance. Per MD note on 12/13/2023: Trendelenburg gait due to hip weakness. Trendelenburg gait due to hip weakness identified. Previous recommendation for physical therapy was not followed through due to logistical issues.  Refer to physical therapy for hip strengthening exercises. PMH:  Rheumatoid arthritis, obesity, prediabetes, HTN.   PAIN:  Are you having pain? None  PRECAUTIONS: None  WEIGHT BEARING RESTRICTIONS: No  FALLS: Has patient fallen in last 6 months? Yes. Number of falls 1x tripped backwards over her cat  LIVING ENVIRONMENT: Lives with: lives with their spouse and lives with their son (husband and special needs son) Lives in: House/apartment - 1 level Stairs: Yes: External: 1 steps; on left going up Has following equipment at home: shower chair and Grab bars  PLOF: Independent, Independent with household mobility without device, and Independent with homemaking with ambulation  PATIENT GOALS: be able to walk with a normal gait  OBJECTIVE: Note: Objective measures were completed at Evaluation unless otherwise noted.  TREATMENT DATE: 06/13/2024   TherEx: To improve strength, endurance, mobility, and function of specific targeted muscle groups or improve joint range of motion or improve muscle flexibility  Seated leg press with B LE's for concentric push, and single leg eccentric return, 80#, x10 each LE   Self Care - Home Management:  Reviewed current POC with pt and discussed discharge options and potential for dropping to 1x/week.  Pt still on the fence of what to do and was asking for therapist recommendations, and was given, however pt still unsure of what she wanted to do.  Pt eventually arrived at the conclusion that she wanted to speak with her MD next week and would follow up with therapist at the next scheduled appointment.      PATIENT EDUCATION: Education details: Pt educated throughout session about proper posture and technique with exercises. Improved exercise technique, movement at target joints, use of target muscles after min to mod verbal, visual, tactile cues. Person educated: Patient Education method: Explanation and Handouts Education comprehension: verbalized understanding and needs further education  HOME EXERCISE  PROGRAM: Access Code: [email or web address] URL: [email or web address] Date: 06/11/2024 Prepared by: Reyes London  Exercises - Supine Bridge with Resistance Band  - 1 x daily - 7 x weekly - 2 sets - 15 reps - Marching Bridge  - 1 x daily - 7 x weekly - 2 sets - 10 reps - Supine Hip Adduction Isometric with Ball  - 1 x daily - 7 x weekly - 2 sets - 10 reps - 3-5 sec  hold - Side Stepping with Resistance at Thighs and Counter Support  - 1 x daily - 7 x weekly - 2-3 sets - 10 reps - Standing Hip Extension with Resistance at Ankles and Counter Support  - 1 x daily - 7 x weekly - 3 sets -  10 reps - Single Leg Stance with Support  - 1 x daily - 7 x weekly - 3 sets - 30 second hold - Standing Hip Circles at [employer]  - 3 x weekly - 3 sets - 10 reps  Access Code: [email or web address] URL: [email or web address] Date: 06/07/24 Prepared by: Lonni Gainer    GOALS: Goals reviewed with patient? Yes  SHORT TERM GOALS: Target date: 03/08/2024 Patient will be independent in home exercise program to improve strength/mobility for better functional independence with ADLs.  Baseline: 12/15/2023 initial HEP provided 6/17: pt completing regularly  Goal status: MET  LONG TERM GOALS: Target date: 08/02/2024    Patient will increase Berg Balance score to > 51/56 to demonstrate improved balance and decreased fall risk during functional activities and ADLs.  Baseline: 55 Goal status: MET  2.  Patient will increase six minute walk test distance to >1066ft for progression to community ambulator and improve gait ability  Baseline: 1080 ft  Goal status: MET  3.  Patient will increase Functional Gait Assessment (FGA) score to >20/30 as to reduce fall risk and improve dynamic gait safety with community ambulation.  Baseline: 23 Goal status: MET  4.  Patient will maintain 30 seconds of single leg stance on each LE without loss of balance indicating increased hip strength and improved ability to  ambulate and navigate stairs without trendelenburg pattern. Baseline: L LE: 5 seconds, R LE: 0 seconds  6/17: 17 sec, 3-4 sec 03/29/24: L LE: 25; R LE: 4 10/23: 20 sec on L 10 sec on R  Goal status: ONGOING/ Progressing  5.  Patient will increase BLE gross strength to 4+/5 as to improve functional strength for independent gait, increased standing tolerance and increased ADL ability.  Baseline: see above Goal status: ONGOING  7.  Patient will improve lower extremity functional scale by 10 points or greater in order to indicate improved function as it relates to her lower extremity weakness. Baseline: 50% 03/29/24: 75% Goal status: MET  8.  Patient will report and demonstrate little to no difficulty when ascending 10 stairs or more in order to improve her safety and confidence with community ambulation and accessing her environment. Baseline: moderate difficulty 03/29/24: moderate difficulty 10/23:  difficulty and requires handrail (min difficulty per LEFS) Goal status: IN PROGRESS  9.  Patient will increase Mini-BESTest score by >/= 6 points to demonstrate decreased fall risk during functional activities. Baseline: 21/28 10/23:21 Goal status: ONGOING  10.  Pt will improve ABC by at least 6% in order to demonstrate clinically significant improvement in balance confidence. Baseline: 88.75% Goal status: NEW   ASSESSMENT:  CLINICAL IMPRESSION:     Pt is making significant progress towards goals, and was given updated HEP and goal assessment at the last visit.  Pt ultimately at a cross-roads with therapy and is weighing the decision based on the plateau that she feels like she has hit.  Pt ultimately will make a final decision next week on POC.   Pt will continue to benefit from skilled therapy to address remaining deficits in order to improve overall QoL and return to PLOF.         OBJECTIVE IMPAIRMENTS: Abnormal gait, decreased activity tolerance, decreased balance, decreased  endurance, decreased knowledge of condition, decreased mobility, difficulty walking, decreased strength, improper body mechanics, and pain.   ACTIVITY LIMITATIONS: carrying, lifting, bending, standing, squatting, stairs, transfers, dressing, locomotion level, and caring for others  PARTICIPATION LIMITATIONS: cleaning, laundry, shopping, and community activity  PERSONAL FACTORS:  Age, Sex, Time since onset of injury/illness/exacerbation, and 3+ comorbidities: Rheumatoid arthritis, obesity, prediabetes, HTN are also affecting patient's functional outcome.   REHAB POTENTIAL: Good  CLINICAL DECISION MAKING: Stable/uncomplicated  EVALUATION COMPLEXITY: Low  PLAN:  PT FREQUENCY: 1-2x/week PT DURATION: 12 weeks PLANNED INTERVENTIONS: 97164- PT Re-evaluation, 97750- Physical Performance Testing, 97110-Therapeutic exercises, 97530- Therapeutic activity, [id number]- Neuromuscular re-education, 97535- Self Care, 02859- Manual therapy, [phone]- Gait training, [phone]- Orthotic Initial, [phone]- Orthotic/Prosthetic subsequent, [phone]- Canalith repositioning, [phone]- Electrical stimulation (manual), Patient/Family education, Balance training, Stair training, Taping, Dry Needling, Joint mobilization, Joint manipulation, Spinal mobilization, Vestibular training, DME instructions, Cryotherapy, Moist heat, and Biofeedback  PLAN FOR NEXT SESSION:    Progress hip abduction strength and work on gait incorporating newly acquired strength and stability       Fonda Simpers, PT, DPT Physical Therapist - [facility]  [facility]  06/13/24, 1:15 PM

## 2024-06-18 ENCOUNTER — Encounter: Payer: Self-pay | Admitting: Family Medicine

## 2024-06-18 ENCOUNTER — Ambulatory Visit

## 2024-06-18 ENCOUNTER — Ambulatory Visit (INDEPENDENT_AMBULATORY_CARE_PROVIDER_SITE_OTHER): Payer: PRIVATE HEALTH INSURANCE | Admitting: Family Medicine

## 2024-06-18 VITALS — BP 132/88 | HR 57 | Ht 67.0 in | Wt 199.9 lb

## 2024-06-18 DIAGNOSIS — Z23 Encounter for immunization: Secondary | ICD-10-CM

## 2024-06-18 DIAGNOSIS — I10 Essential (primary) hypertension: Secondary | ICD-10-CM

## 2024-06-18 DIAGNOSIS — E66811 Obesity, class 1: Secondary | ICD-10-CM | POA: Diagnosis not present

## 2024-06-18 DIAGNOSIS — S76011D Strain of muscle, fascia and tendon of right hip, subsequent encounter: Secondary | ICD-10-CM

## 2024-06-18 DIAGNOSIS — Z6831 Body mass index (BMI) 31.0-31.9, adult: Secondary | ICD-10-CM

## 2024-06-18 DIAGNOSIS — R7303 Prediabetes: Secondary | ICD-10-CM | POA: Diagnosis not present

## 2024-06-18 DIAGNOSIS — M069 Rheumatoid arthritis, unspecified: Secondary | ICD-10-CM

## 2024-06-18 NOTE — Assessment & Plan Note (Signed)
 Blood sugar levels monitored.  - Continue monitoring blood sugar levels

## 2024-06-18 NOTE — Assessment & Plan Note (Signed)
 Rheumatoid arthritis limited to hands. Current management with methotrexate and folic acid is effective. - Continue methotrexate and folic acid regimen - Added blood counts to labs due to methotrexate use

## 2024-06-18 NOTE — Progress Notes (Signed)
 Established patient visit   Patient: Rita Bailey   DOB: 1951/05/04   73 y.o. Female  MRN: [id number] Visit Date: 06/18/2024  Today's healthcare provider: Jon Eva, MD   Chief Complaint  Patient presents with   Medical Management of Chronic Issues   Hypertension   Subjective    Hypertension     Discussed the use of AI scribe software for clinical note transcription with the patient, who gave verbal consent to proceed.  History of Present Illness   Rita Bailey is a 73 year old female with hypertension and rheumatoid arthritis who presents with caregiver stress and fatigue.  She experiences significant caregiver stress, feeling mentally, physically, and emotionally exhausted due to responsibilities, including caring for her mother and son. Her husband's worsening condition adds to her stress.  She experiences fatigue, with stalled progress in physical therapy. She feels sore and tired, particularly in the muscles, and the soreness disrupts her sleep. She plans to resume physical therapy after the holidays.  Her current medications include methotrexate and folic acid weekly, and lisinopril  20 mg and hydrochlorothiazide  25 mg daily for hypertension. She is aware of a recall for hydrochlorothiazide  and plans to verify with her pharmacy.  Her usual blood pressure is in the 120s, but it was just over 130 today. She maintains her weight below 200 pounds, attributing challenges in weight management to lack of sleep.         Medications: Outpatient Medications Prior to Visit  Medication Sig   buPROPion  (WELLBUTRIN  XL) 300 MG 24 hr tablet Take 1 tablet (300 mg total) by mouth daily.   Cholecalciferol 50 MCG (2000 UT) TABS Take 1 tablet by mouth daily.   fluticasone  (FLONASE ) 50 MCG/ACT nasal spray Place 2 sprays into both nostrils as needed.   folic acid (FOLVITE) 1 MG tablet Take 1 mg by mouth daily.   hydrochlorothiazide  (HYDRODIURIL ) 25 MG tablet  Take 1 tablet (25 mg total) by mouth daily.   lisinopril  (ZESTRIL ) 20 MG tablet Take 1 tablet (20 mg total) by mouth daily.   methotrexate (RHEUMATREX) 2.5 MG tablet Take 12.5 mg by mouth once a week.   Multiple Vitamin (MULTI-VITAMINS) TABS Take 1 tablet by mouth daily.   naltrexone  (DEPADE) 50 MG tablet Take 0.5 tablets (25 mg total) by mouth daily.   valACYclovir  (VALTREX ) 1000 MG tablet TAKE 2 TABLETS TWICE DAILY AS NEEDED   No facility-administered medications prior to visit.    Review of Systems      Objective    BP 132/88 (BP Location: Left Arm, Patient Position: Sitting, Cuff Size: Normal)   Pulse (!) 57   Ht 5' 7 (1.702 m)   Wt 199 lb 14.4 oz (90.7 kg)   SpO2 99%   BMI 31.31 kg/m    Physical Exam Vitals reviewed.  Constitutional:      General: She is not in acute distress.    Appearance: Normal appearance. She is well-developed. She is not diaphoretic.  HENT:     Head: Normocephalic and atraumatic.  Eyes:     General: No scleral icterus.    Conjunctiva/sclera: Conjunctivae normal.  Neck:     Thyroid: No thyromegaly.  Cardiovascular:     Rate and Rhythm: Normal rate and regular rhythm.     Heart sounds: Normal heart sounds. No murmur heard. Pulmonary:     Effort: Pulmonary effort is normal. No respiratory distress.     Breath sounds: Normal breath sounds. No wheezing, rhonchi  or rales.  Musculoskeletal:     Cervical back: Neck supple.     Right lower leg: No edema.     Left lower leg: No edema.  Lymphadenopathy:     Cervical: No cervical adenopathy.  Skin:    General: Skin is warm and dry.     Findings: No rash.  Neurological:     Mental Status: She is alert and oriented to person, place, and time. Mental status is at baseline.  Psychiatric:        Mood and Affect: Mood normal.        Behavior: Behavior normal.      No results found for any visits on 06/18/24.  Assessment & Plan     Problem List Items Addressed This Visit        Cardiovascular and Mediastinum   Hypertension - Primary   Blood pressure slightly elevated at 130s, above her usual 120s. Current regimen of 20 mg lisinopril  and 25 mg hydrochlorothiazide  is effective. No changes recommended to avoid hypotension. - Continue lisinopril  20 mg daily - Continue hydrochlorothiazide  25 mg daily      Relevant Orders   Lipid Panel With LDL/HDL Ratio     Musculoskeletal and Integument   Strain of muscle of right hip   Experiencing soreness and fatigue from physical therapy. Progress at 75-80%. Taking a break from physical therapy until after the holidays to allow muscles to rest. - Take a break from physical therapy until after the holidays - Resume physical therapy when ready       Rheumatoid arthritis (HCC)   Rheumatoid arthritis limited to hands. Current management with methotrexate and folic acid is effective. - Continue methotrexate and folic acid regimen - Added blood counts to labs due to methotrexate use        Other   Obesity   Weight below 200 lbs. Sleep deprivation impacts weight loss due to elevated cortisol levels. Acknowledges progress and is satisfied with current weight. - Encouraged adequate sleep to aid weight loss      Relevant Orders   Comprehensive metabolic panel with GFR   Prediabetes   Blood sugar levels monitored.  - Continue monitoring blood sugar levels      Relevant Orders   Hemoglobin A1c   Other Visit Diagnoses       Immunization due       Relevant Orders   Flu vaccine HIGH DOSE PF(Fluzone Trivalent) (Completed)       Return in about 6 months (around 12/16/2024) for AWV.       Jon Eva, MD  [facility] [phone] (phone) [phone] (fax)  [facility]

## 2024-06-18 NOTE — Assessment & Plan Note (Signed)
 Blood pressure slightly elevated at 130s, above her usual 120s. Current regimen of 20 mg lisinopril  and 25 mg hydrochlorothiazide  is effective. No changes recommended to avoid hypotension. - Continue lisinopril  20 mg daily - Continue hydrochlorothiazide  25 mg daily

## 2024-06-18 NOTE — Assessment & Plan Note (Signed)
 Experiencing soreness and fatigue from physical therapy. Progress at 75-80%. Taking a break from physical therapy until after the holidays to allow muscles to rest. - Take a break from physical therapy until after the holidays - Resume physical therapy when ready

## 2024-06-18 NOTE — Assessment & Plan Note (Signed)
 Weight below 200 lbs. Sleep deprivation impacts weight loss due to elevated cortisol levels. Acknowledges progress and is satisfied with current weight. - Encouraged adequate sleep to aid weight loss

## 2024-06-19 ENCOUNTER — Ambulatory Visit: Payer: Self-pay | Admitting: Family Medicine

## 2024-06-19 ENCOUNTER — Ambulatory Visit: Payer: PRIVATE HEALTH INSURANCE

## 2024-06-19 DIAGNOSIS — N183 Chronic kidney disease, stage 3 unspecified: Secondary | ICD-10-CM | POA: Insufficient documentation

## 2024-06-19 DIAGNOSIS — N1832 Chronic kidney disease, stage 3b: Secondary | ICD-10-CM

## 2024-06-19 LAB — COMPREHENSIVE METABOLIC PANEL WITH GFR
ALT: 19 IU/L (ref 0–32)
AST: 23 IU/L (ref 0–40)
Albumin: 4.2 g/dL (ref 3.9–4.9)
Alkaline Phosphatase: 62 IU/L (ref 49–135)
BUN/Creatinine Ratio: 19 (ref 12–28)
BUN: 26 mg/dL (ref 8–27)
Bilirubin Total: 0.4 mg/dL (ref 0.0–1.2)
CO2: 22 mmol/L (ref 20–29)
Calcium: 9.4 mg/dL (ref 8.7–10.3)
Chloride: 102 mmol/L (ref 96–106)
Creatinine, Ser: 1.34 mg/dL — ABNORMAL HIGH (ref 0.57–1.00)
Globulin, Total: 2.3 g/dL (ref 1.5–4.5)
Glucose: 98 mg/dL (ref 70–99)
Potassium: 3.8 mmol/L (ref 3.5–5.2)
Sodium: 141 mmol/L (ref 134–144)
Total Protein: 6.5 g/dL (ref 6.0–8.5)
eGFR: 43 mL/min/1.73 — ABNORMAL LOW (ref >59)

## 2024-06-19 LAB — HEMOGLOBIN A1C
Est. average glucose Bld gHb Est-mCnc: 114 mg/dL
Hgb A1c MFr Bld: 5.6 % (ref 4.8–5.6)

## 2024-06-19 LAB — LIPID PANEL WITH LDL/HDL RATIO
Cholesterol, Total: 185 mg/dL (ref 100–199)
HDL: 75 mg/dL (ref >39)
LDL Chol Calc (NIH): 97 mg/dL (ref 0–99)
LDL/HDL Ratio: 1.3 ratio (ref 0.0–3.2)
Triglycerides: 69 mg/dL (ref 0–149)
VLDL Cholesterol Cal: 13 mg/dL (ref 5–40)

## 2024-06-21 ENCOUNTER — Ambulatory Visit: Payer: PRIVATE HEALTH INSURANCE

## 2024-06-26 ENCOUNTER — Ambulatory Visit: Payer: PRIVATE HEALTH INSURANCE

## 2024-06-27 ENCOUNTER — Ambulatory Visit: Attending: Family Medicine | Admitting: Physical Therapy

## 2024-06-27 DIAGNOSIS — R2681 Unsteadiness on feet: Secondary | ICD-10-CM | POA: Diagnosis present

## 2024-06-27 DIAGNOSIS — R269 Unspecified abnormalities of gait and mobility: Secondary | ICD-10-CM | POA: Insufficient documentation

## 2024-06-27 DIAGNOSIS — R531 Weakness: Secondary | ICD-10-CM | POA: Diagnosis present

## 2024-06-27 NOTE — Therapy (Signed)
 OUTPATIENT PHYSICAL THERAPY TREATMENT/  Discharge Summary.   Patient Name: Rita Bailey MRN: [id number] DOB:[birth year], 73 y.o., female Today's Date: 06/27/2024  PCP: Myrla Jon HERO, MD REFERRING PROVIDER: Myrla Jon HERO, MD   END OF SESSION:    PT End of Session - 06/27/24 1154     Visit Number 32    Number of Visits 53    Date for Recertification  08/02/24    Progress Note Due on Visit 30    PT Start Time 1151    PT Stop Time 1230    PT Time Calculation (min) 39 min    Equipment Utilized During Treatment Gait belt    Activity Tolerance Patient tolerated treatment well    Behavior During Therapy [facility] for tasks assessed/performed          Past Medical History:  Diagnosis Date   Allergy    Arthritis    Hypertension    Scoliosis of lumbosacral spine    Past Surgical History:  Procedure Laterality Date   CESAREAN SECTION     COLONOSCOPY WITH PROPOFOL  N/A 05/11/2017   Procedure: COLONOSCOPY WITH PROPOFOL ;  Surgeon: Dessa Reyes ORN, MD;  Location: [facility] ENDOSCOPY;  Service: Endoscopy;  Laterality: N/A;   COLONOSCOPY WITH PROPOFOL  N/A 12/16/2021   Procedure: COLONOSCOPY WITH PROPOFOL ;  Surgeon: Dessa Reyes ORN, MD;  Location: [facility] ENDOSCOPY;  Service: Endoscopy;  Laterality: N/A;   DILATATION & CURETTAGE/HYSTEROSCOPY WITH MYOSURE N/A 02/27/2016   Procedure: DILATATION & CURETTAGE/HYSTEROSCOPY WITH MYOSURE;  Surgeon: Debby JINNY Dinsmore, MD;  Location: [facility] ORS;  Service: Gynecology;  Laterality: N/A;   ESOPHAGOGASTRODUODENOSCOPY (EGD) WITH PROPOFOL  N/A 05/11/2017   Procedure: ESOPHAGOGASTRODUODENOSCOPY (EGD) WITH PROPOFOL ;  Surgeon: Dessa Reyes ORN, MD;  Location: [facility] ENDOSCOPY;  Service: Endoscopy;  Laterality: N/A;   sebaceous syst removed  08/24/2011   tonsillectomy     TONSILLECTOMY     Patient Active Problem List   Diagnosis Date Noted   CKD (chronic kidney disease) stage 3, GFR 30-59 ml/min (HCC) 06/19/2024   Rheumatoid arthritis (HCC)  05/30/2023   Bilateral hand swelling 12/28/2022   Eczema 04/13/2018   Irritable bowel syndrome with diarrhea 04/13/2018   Prediabetes 04/13/2018   PMB (postmenopausal bleeding) 10/05/2017   Gastro-esophageal reflux disease with esophagitis 05/13/2017   Strain of muscle of right hip 05/06/2017   Bilateral hearing loss 04/12/2017   Scoliosis of lumbosacral spine 03/30/2017   Abnormality of gait and mobility 03/30/2017   Allergic rhinitis 09/08/2015   Hypertension 09/08/2015   Gonalgia 09/08/2015   RAD (reactive airway disease) 09/08/2015   Obesity 03/12/2015   Current tear of meniscus 01/02/2015   Tear of meniscus of knee 01/02/2015    ONSET DATE: Years ago REFERRING DIAG: R26.9 (ICD-10-CM) - Abnormal gait  THERAPY DIAG:  Generalized weakness  Unsteadiness on feet  Abnormality of gait  Rationale for Evaluation and Treatment: Rehabilitation  SUBJECTIVE:  SUBJECTIVE STATEMENT:  Pt reports back to PT after short 2 week hiatus after talking to MD about need to continue PT.  Pt reports that she has a lot going on managing the medical needs multiple family members. States that her son's mobility is a getting a little worse; and he may also need PT for arthritis in the near future.  Due to increased physical and emotional demand along with independence with HEP; pt would like to d/c at this time.   PERTINENT HISTORY:  Patient reports she had previously went to [facility] to see Ortho and was referred for physical therapy, but due to logistical errors she did not start PT at that time.  Pt states these gait deviations started years ago. Reports no injury at the time they started. Pt states she has always walked a lot for exercise, but it is hard for her to do now. Pt states she has noticed her gait  deviations because she has started getting L thigh pain, but otherwise does not have pain. Pt states these deviations impact her balance. Per MD note on 12/13/2023: Trendelenburg gait due to hip weakness. Trendelenburg gait due to hip weakness identified. Previous recommendation for physical therapy was not followed through due to logistical issues.  Refer to physical therapy for hip strengthening exercises. PMH: Rheumatoid arthritis, obesity, prediabetes, HTN.   PAIN:  Are you having pain? None  PRECAUTIONS: None  WEIGHT BEARING RESTRICTIONS: No  FALLS: Has patient fallen in last 6 months? Yes. Number of falls 1x tripped backwards over her cat  LIVING ENVIRONMENT: Lives with: lives with their spouse and lives with their son (husband and special needs son) Lives in: House/apartment - 1 level Stairs: Yes: External: 1 steps; on left going up Has following equipment at home: shower chair and Grab bars  PLOF: Independent, Independent with household mobility without device, and Independent with homemaking with ambulation  PATIENT GOALS: be able to walk with a normal gait  OBJECTIVE: Note: Objective measures were completed at Evaluation unless otherwise noted.   ABC scale: The Activities-Specific Balance Confidence (ABC) Scale    No confidence<->completely confident      "How confident are you that you will not lose your balance or become unsteady when you . . .         Date tested 06/27/2024     1: Walk around the house 90    2. Walk up or down stairs 90    3. Bend over and pick up a slipper from in front of a closet floor 90    4. Reach for a small can off a shelf at eye level 100    5. Stand on tip toes and reach for something above your head 90    6. Stand on a chair and reach for something 80    7. Sweep the floor 100    8. Walk outside the house to a car parked in the driveway 90    9. Get into or out of a car 100    10. Walk across a parking lot to the mall 80    11. Walk  up or down a ramp 90    12. Walk in a crowded mall where people rapidly walk past you 90    13. Are bumped into by people as you walk through the mall 80    14. Step onto or off of an escalator while you are holding onto the railing 80    15.  Step onto or off an escalator while holding onto parcels such that you cannot hold onto the railing 70    16. Walk outside on icy sidewalks 70    Total: #/16 [phone]     [facility] 2 PT Assessment - 06/27/24 0001       Mini-BESTest   Sit To Stand Normal: Comes to stand without use of hands and stabilizes independently.    Rise to Toes Normal: Stable for 3 s with maximum height.    Stand on one leg (left) Normal: 20 s.    Stand on one leg (right) Moderate: < 20 s    Stand on one leg - lowest score 1    Compensatory Stepping Correction - Forward Normal: Recovers independently with a single, large step (second realignement is allowed).    Compensatory Stepping Correction - Backward Normal: Recovers independently with a single, large step    Compensatory Stepping Correction - Left Lateral Normal: Recovers independently with 1 step (crossover or lateral OK)    Compensatory Stepping Correction - Right Lateral Normal: Recovers independently with 1 step (crossover or lateral OK)    Stepping Corredtion Lateral - lowest score 2    Stance - Feet together, eyes open, firm surface  Normal: 30s    Stance - Feet together, eyes closed, foam surface  Normal: 30s    Incline - Eyes Closed Normal: Stands independently 30s and aligns with gravity    Change in Gait Speed Moderate: Unable to change walking speed or signs of imbalance    Walk with head turns - Horizontal Normal: performs head turns with no change in gait speed and good balance    Walk with pivot turns Normal: Turns with feet close FAST (< 3 steps) with good balance.    Step over obstacles Moderate: Steps over box but touches box OR displays cautious behavior by slowing gait.    Timed UP & GO with Dual Task  Moderate: Dual Task affects either counting OR walking (>10%) when compared to the TUG without Dual Task.   9.17 sec without UE support; 10.6 with dual   Mini-BEST total score 24           TREATMENT DATE: 06/27/2024  PT instructed pt in mini best test: 24/30 Points. MCID 4 points: (Godi,et al, 2013) .   Education on importance of self care and completion of HEP to maintain progress. Pt is planning on returning to PT after to start of the new year, but also states that son may need PT to address strength and balance deficits to maintain mobility.   HEP review for interventions listed below.   PATIENT EDUCATION: Education details: Pt educated throughout session about proper posture and technique with exercises. Improved exercise technique, movement at target joints, use of target muscles after min to mod verbal, visual, tactile cues. Person educated: Patient Education method: Explanation and Handouts Education comprehension: verbalized understanding and needs further education  HOME EXERCISE PROGRAM: Access Code: [email or web address] URL: [email or web address] Date: 06/11/2024 Prepared by: Reyes London  Exercises - Supine Bridge with Resistance Band  - 1 x daily - 7 x weekly - 2 sets - 15 reps - Marching Bridge  - 1 x daily - 7 x weekly - 2 sets - 10 reps - Supine Hip Adduction Isometric with Ball  - 1 x daily - 7 x weekly - 2 sets - 10 reps - 3-5 sec  hold - Side Stepping with Resistance at Thighs and Counter Support  - 1 x daily -  7 x weekly - 2-3 sets - 10 reps - Standing Hip Extension with Resistance at Ankles and Counter Support  - 1 x daily - 7 x weekly - 3 sets - 10 reps - Single Leg Stance with Support  - 1 x daily - 7 x weekly - 3 sets - 30 second hold - Standing Hip Circles at [employer]  - 3 x weekly - 3 sets - 10 reps  Access Code: [email or web address] URL: [email or web address] Date: 06/07/24 Prepared by: Lonni Gainer    GOALS: Goals reviewed with  patient? Yes  SHORT TERM GOALS: Target date: 03/08/2024 Patient will be independent in home exercise program to improve strength/mobility for better functional independence with ADLs.  Baseline: 12/15/2023 initial HEP provided 6/17: pt completing regularly  Goal status: MET  LONG TERM GOALS: Target date: 08/02/2024    Patient will increase Berg Balance score to > 51/56 to demonstrate improved balance and decreased fall risk during functional activities and ADLs.  Baseline: 55 Goal status: MET  2.  Patient will increase six minute walk test distance to >1090ft for progression to community ambulator and improve gait ability  Baseline: 1080 ft  Goal status: MET  3.  Patient will increase Functional Gait Assessment (FGA) score to >20/30 as to reduce fall risk and improve dynamic gait safety with community ambulation.  Baseline: 23 Goal status: MET  4.  Patient will maintain 30 seconds of single leg stance on each LE without loss of balance indicating increased hip strength and improved ability to ambulate and navigate stairs without trendelenburg pattern. Baseline: L LE: 5 seconds, R LE: 0 seconds  6/17: 17 sec, 3-4 sec 03/29/24: L LE: 25; R LE: 4 10/23: 20 sec on L 10 sec on R  Goal status: ONGOING/ Progressing  5.  Patient will increase BLE gross strength to 4+/5 as to improve functional strength for independent gait, increased standing tolerance and increased ADL ability.  Baseline: see above Goal status: ONGOING  7.  Patient will improve lower extremity functional scale by 10 points or greater in order to indicate improved function as it relates to her lower extremity weakness. Baseline: 50% 03/29/24: 75% 11/12: 61% Goal status: MET  8.  Patient will report and demonstrate little to no difficulty when ascending 10 stairs or more in order to improve her safety and confidence with community ambulation and accessing her environment. Baseline: moderate difficulty 03/29/24: moderate  difficulty 10/23:  difficulty and requires handrail (min difficulty per LEFS) 11/12: difficulty and requires handrail (min difficulty per LEFS) Goal status: IN PROGRESS  9.  Patient will increase Mini-BESTest score by >/= 6 points to demonstrate decreased fall risk during functional activities. Baseline: 21/28 10/23:21 11/12: 24 Goal status: ONGOING  10.  Pt will improve ABC by at least 6% in order to demonstrate clinically significant improvement in balance confidence. Baseline: 88.75%  11/12: 86.9% Goal status: NEW   ASSESSMENT:  CLINICAL IMPRESSION:     PT instructed pt in goal assessment. Noted to have slight regression in self reported performance, but improved balance and function noted through Mini best test. At this time, pt would like to d/c from PT with hopes of returning to PT after the start of the new year. Re-print of HEP provided in 2 sessions prior to improve consistency of HEP compliance.   Will cease skilled PT treatments at this time due to progress and need to focus on other things in life for the next few months.  OBJECTIVE IMPAIRMENTS: Abnormal gait, decreased activity tolerance, decreased balance, decreased endurance, decreased knowledge of condition, decreased mobility, difficulty walking, decreased strength, improper body mechanics, and pain.   ACTIVITY LIMITATIONS: carrying, lifting, bending, standing, squatting, stairs, transfers, dressing, locomotion level, and caring for others  PARTICIPATION LIMITATIONS: cleaning, laundry, shopping, and community activity  PERSONAL FACTORS: Age, Sex, Time since onset of injury/illness/exacerbation, and 3+ comorbidities: Rheumatoid arthritis, obesity, prediabetes, HTN are also affecting patient's functional outcome.   REHAB POTENTIAL: Good  CLINICAL DECISION MAKING: Stable/uncomplicated  EVALUATION COMPLEXITY: Low  PLAN:  PT FREQUENCY: 1-2x/week PT DURATION: 12 weeks PLANNED INTERVENTIONS: 97164- PT  Re-evaluation, 97750- Physical Performance Testing, 97110-Therapeutic exercises, 97530- Therapeutic activity, 97112- Neuromuscular re-education, 97535- Self Care, [id number]- Manual therapy, [phone]- Gait training, [phone]- Orthotic Initial, [phone]- Orthotic/Prosthetic subsequent, [phone]- Canalith repositioning, [phone]- Electrical stimulation (manual), Patient/Family education, Balance training, Stair training, Taping, Dry Needling, Joint mobilization, Joint manipulation, Spinal mobilization, Vestibular training, DME instructions, Cryotherapy, Moist heat, and Biofeedback  PLAN FOR NEXT SESSION:   N.A   Massie Dollar PT, DPT  Physical Therapist - [place]  [facility]  1:51 PM 06/27/24

## 2024-07-02 ENCOUNTER — Ambulatory Visit: Payer: PRIVATE HEALTH INSURANCE | Admitting: Physical Therapy

## 2024-07-03 ENCOUNTER — Ambulatory Visit

## 2024-07-03 ENCOUNTER — Ambulatory Visit
Admission: RE | Admit: 2024-07-03 | Discharge: 2024-07-03 | Disposition: A | Discharge status: Home / Self Care | Payer: PRIVATE HEALTH INSURANCE | Source: Ambulatory Visit | Attending: Family Medicine | Admitting: Family Medicine

## 2024-07-03 DIAGNOSIS — Z1231 Encounter for screening mammogram for malignant neoplasm of breast: Secondary | ICD-10-CM | POA: Insufficient documentation

## 2024-07-04 ENCOUNTER — Ambulatory Visit: Payer: PRIVATE HEALTH INSURANCE

## 2024-07-09 ENCOUNTER — Ambulatory Visit: Payer: PRIVATE HEALTH INSURANCE | Admitting: Physical Therapy

## 2024-07-11 ENCOUNTER — Ambulatory Visit: Payer: Self-pay | Admitting: Physician Assistant

## 2024-07-17 ENCOUNTER — Ambulatory Visit: Payer: PRIVATE HEALTH INSURANCE

## 2024-07-19 ENCOUNTER — Ambulatory Visit: Payer: PRIVATE HEALTH INSURANCE

## 2024-07-24 ENCOUNTER — Ambulatory Visit: Payer: PRIVATE HEALTH INSURANCE

## 2024-07-26 ENCOUNTER — Ambulatory Visit: Payer: PRIVATE HEALTH INSURANCE

## 2024-07-31 ENCOUNTER — Ambulatory Visit: Payer: PRIVATE HEALTH INSURANCE

## 2024-08-02 ENCOUNTER — Ambulatory Visit: Payer: PRIVATE HEALTH INSURANCE

## 2024-08-07 ENCOUNTER — Ambulatory Visit: Payer: PRIVATE HEALTH INSURANCE

## 2024-08-14 ENCOUNTER — Ambulatory Visit: Payer: PRIVATE HEALTH INSURANCE

## 2024-08-17 ENCOUNTER — Ambulatory Visit: Payer: PRIVATE HEALTH INSURANCE | Admitting: Physical Therapy

## 2024-08-21 ENCOUNTER — Ambulatory Visit: Payer: PRIVATE HEALTH INSURANCE

## 2024-08-22 ENCOUNTER — Encounter: Payer: Self-pay | Admitting: Family Medicine

## 2024-08-23 ENCOUNTER — Ambulatory Visit: Payer: PRIVATE HEALTH INSURANCE

## 2024-12-25 ENCOUNTER — Encounter: Payer: PRIVATE HEALTH INSURANCE | Admitting: Family Medicine
